# Patient Record
Sex: Female | Born: 1955 | Race: White | Hispanic: No | Marital: Married | Smoking: Never smoker
Health system: Southern US, Community
[De-identification: ages and names within clinical notes are randomized; demographics above are authoritative.]

## PROBLEM LIST (undated history)

## (undated) DIAGNOSIS — F99 Mental disorder, not otherwise specified: Secondary | ICD-10-CM

## (undated) DIAGNOSIS — Z794 Long term (current) use of insulin: Secondary | ICD-10-CM

## (undated) DIAGNOSIS — T4145XA Adverse effect of unspecified anesthetic, initial encounter: Secondary | ICD-10-CM

## (undated) DIAGNOSIS — T8859XA Other complications of anesthesia, initial encounter: Secondary | ICD-10-CM

## (undated) DIAGNOSIS — K449 Diaphragmatic hernia without obstruction or gangrene: Secondary | ICD-10-CM

## (undated) DIAGNOSIS — F32A Depression, unspecified: Secondary | ICD-10-CM

## (undated) DIAGNOSIS — Z87442 Personal history of urinary calculi: Secondary | ICD-10-CM

## (undated) DIAGNOSIS — F329 Major depressive disorder, single episode, unspecified: Secondary | ICD-10-CM

## (undated) DIAGNOSIS — E119 Type 2 diabetes mellitus without complications: Secondary | ICD-10-CM

## (undated) DIAGNOSIS — G4733 Obstructive sleep apnea (adult) (pediatric): Secondary | ICD-10-CM

## (undated) DIAGNOSIS — Z8719 Personal history of other diseases of the digestive system: Secondary | ICD-10-CM

## (undated) DIAGNOSIS — N201 Calculus of ureter: Secondary | ICD-10-CM

## (undated) DIAGNOSIS — E7800 Pure hypercholesterolemia, unspecified: Secondary | ICD-10-CM

## (undated) DIAGNOSIS — K219 Gastro-esophageal reflux disease without esophagitis: Secondary | ICD-10-CM

## (undated) DIAGNOSIS — I10 Essential (primary) hypertension: Secondary | ICD-10-CM

## (undated) DIAGNOSIS — N289 Disorder of kidney and ureter, unspecified: Secondary | ICD-10-CM

## (undated) DIAGNOSIS — Z973 Presence of spectacles and contact lenses: Secondary | ICD-10-CM

## (undated) DIAGNOSIS — F419 Anxiety disorder, unspecified: Secondary | ICD-10-CM

## (undated) HISTORY — PX: CARPAL TUNNEL RELEASE: SHX101

## (undated) HISTORY — PX: TUBAL LIGATION: SHX77

## (undated) HISTORY — PX: COLONOSCOPY: SHX174

## (undated) HISTORY — PX: TONSILLECTOMY: SUR1361

---

## 1982-05-19 HISTORY — PX: DILATION AND CURETTAGE OF UTERUS: SHX78

## 1998-11-13 ENCOUNTER — Other Ambulatory Visit: Admission: RE | Admit: 1998-11-13 | Discharge: 1998-11-13 | Payer: Self-pay | Admitting: Obstetrics & Gynecology

## 1999-05-20 HISTORY — PX: CARPAL TUNNEL RELEASE: SHX101

## 1999-09-03 ENCOUNTER — Ambulatory Visit (HOSPITAL_BASED_OUTPATIENT_CLINIC_OR_DEPARTMENT_OTHER): Admission: RE | Admit: 1999-09-03 | Discharge: 1999-09-03 | Payer: Self-pay | Admitting: Orthopedic Surgery

## 1999-11-25 ENCOUNTER — Other Ambulatory Visit: Admission: RE | Admit: 1999-11-25 | Discharge: 1999-11-25 | Payer: Self-pay | Admitting: Obstetrics & Gynecology

## 2000-12-07 ENCOUNTER — Other Ambulatory Visit: Admission: RE | Admit: 2000-12-07 | Discharge: 2000-12-07 | Payer: Self-pay | Admitting: Obstetrics & Gynecology

## 2001-05-19 DIAGNOSIS — E119 Type 2 diabetes mellitus without complications: Secondary | ICD-10-CM

## 2001-05-19 HISTORY — DX: Type 2 diabetes mellitus without complications: E11.9

## 2002-01-21 ENCOUNTER — Other Ambulatory Visit: Admission: RE | Admit: 2002-01-21 | Discharge: 2002-01-21 | Payer: Self-pay | Admitting: Obstetrics & Gynecology

## 2003-04-18 ENCOUNTER — Encounter: Payer: Self-pay | Admitting: Cardiovascular Disease

## 2003-12-15 ENCOUNTER — Other Ambulatory Visit: Admission: RE | Admit: 2003-12-15 | Discharge: 2003-12-15 | Payer: Self-pay | Admitting: Obstetrics & Gynecology

## 2010-04-03 ENCOUNTER — Ambulatory Visit (HOSPITAL_COMMUNITY): Admission: RE | Admit: 2010-04-03 | Discharge: 2010-04-03 | Payer: Self-pay | Admitting: Internal Medicine

## 2010-05-19 HISTORY — PX: SHOULDER ARTHROSCOPY W/ ROTATOR CUFF REPAIR: SHX2400

## 2010-10-02 ENCOUNTER — Ambulatory Visit (HOSPITAL_BASED_OUTPATIENT_CLINIC_OR_DEPARTMENT_OTHER)
Admission: RE | Admit: 2010-10-02 | Discharge: 2010-10-02 | Disposition: A | Discharge status: Home / Self Care | Payer: Medicare Other | Source: Ambulatory Visit | Attending: Orthopedic Surgery | Admitting: Orthopedic Surgery

## 2010-10-02 DIAGNOSIS — S43429A Sprain of unspecified rotator cuff capsule, initial encounter: Secondary | ICD-10-CM | POA: Insufficient documentation

## 2010-10-02 DIAGNOSIS — M7500 Adhesive capsulitis of unspecified shoulder: Secondary | ICD-10-CM | POA: Insufficient documentation

## 2010-10-02 DIAGNOSIS — Z794 Long term (current) use of insulin: Secondary | ICD-10-CM | POA: Insufficient documentation

## 2010-10-02 DIAGNOSIS — M25819 Other specified joint disorders, unspecified shoulder: Secondary | ICD-10-CM | POA: Insufficient documentation

## 2010-10-02 DIAGNOSIS — I10 Essential (primary) hypertension: Secondary | ICD-10-CM | POA: Insufficient documentation

## 2010-10-02 DIAGNOSIS — X58XXXA Exposure to other specified factors, initial encounter: Secondary | ICD-10-CM | POA: Insufficient documentation

## 2010-10-02 DIAGNOSIS — E119 Type 2 diabetes mellitus without complications: Secondary | ICD-10-CM | POA: Insufficient documentation

## 2010-10-02 DIAGNOSIS — Z79899 Other long term (current) drug therapy: Secondary | ICD-10-CM | POA: Insufficient documentation

## 2010-10-03 LAB — GLUCOSE, CAPILLARY: Glucose-Capillary: 147 mg/dL — ABNORMAL HIGH (ref 70–99)

## 2010-10-03 LAB — POCT I-STAT 4, (NA,K, GLUC, HGB,HCT): Hemoglobin: 14.3 g/dL (ref 12.0–15.0)

## 2010-10-04 NOTE — Op Note (Signed)
[place]  Patient:    Mindy Richard, MONCEAUX                       MRN: [id number] Proc. Date: 09/03/99 Attending:  Katy Fitch. Naaman Plummer., M.D. CC:         Katy Fitch. Sypher, Montez Hageman., M.D. (2)                           Operative Report  PREOPERATIVE DIAGNOSIS: 1. Right carpal tunnel syndrome. 2. Stenosing tenosynovitis right long finger at A-1 pulley.  POSTOPERATIVE DIAGNOSIS: 1. Right carpal tunnel syndrome. 2. Stenosing tenosynovitis right long finger at A-1 pulley.  OPERATION PERFORMED: 1. Right carpal tunnel release. 2. Release of right long finger A-1 pulley.  Note that these were performed through separate incisions.  OPERATING SURGEON:  Josephine Igo, M.D.  ANESTHESIA:  General by mask.  ANESTHESIOLOGIST:  Dr. Krista Blue.  INDICATIONS:  The patient is a 55 year old woman with type 2 diabetes.  She is managed on Glucophage.  She has a history of entrapment neuropathy symptoms and triggering of her right long finger, both unresponsive to nonoperative measures including activity modification, anti-inflammatory medication and rest.  Due to failure of nonoperative measures, the patient is brought to the operating room at this time for release of her right transverse carpal ligament and release of the A-1 pulley to the right long finger.  DESCRIPTION OF PROCEDURE:  Deniyah Dillavou was brought to the operating room and placed in supine position on the operating table.  Following induction of general anesthesia, the right arm was prepped with Betadine soap and solution and sterilely draped.  Due to a mild poison ivy rash near the elbow flexion crease, Op-Site was placed over this region to protect the lesions and to prevent exposure to the Betadine.  After sterile draping, the arm was exanguinated with an Esmarch bandage.  An arterial tourniquet was inflated to 220 mmHg.  The procedure commenced with a short transverse incision just distal to the  distal palmar crease overlying the palpable thickened A-1 pulley of the long finger.  The subcutaneous tissues were carefully divided taking care to identify the flexor sheath.  The A-1 pulley was isolated and split with scalpel and scissors.  No A-0 pulley was identified.  The tendons were noted to be otherwise normal.  This relieved the clinical triggering.  This wound was repaired with intradermal 3-0 Prolene suture.  Attention was then directed to the proximal palm where a short transverse incision was fashioned in the line of the ring finger.  Subcutaneous tissues were carefully divided revealing the palmar fascia.  This was split longitudinally to reveal the common sensory branches of the median nerve. These were followed back to the transverse carpal ligament which was carefully isolated from the median nerve.  The ligament was released on its ulnar border extending into the distal forearm.  This widely opened the carpal canal.  No masses or other predicaments were noted.  Bleeding points along the margin of the released ligament were electrocauterized with bipolar current followed by repair of the skin with intradermal 3-0 Prolene suture.  A compressive dressing was applied with a volar plaster splint maintaining the wrist in 5 degrees dorsiflexion. DD:  09/03/99 TD:  09/03/99 Job: [id number]

## 2010-10-08 NOTE — Op Note (Signed)
Mindy Richard, Mindy Richard                 ACCOUNT NO.:  [id number]  MEDICAL RECORD NO.:  [id number]           PATIENT TYPE:  LOCATION:                                FACILITY:  [facility]  PHYSICIAN:  Marlowe Kays, M.D.  DATE OF BIRTH:  January 29, 1956  DATE OF PROCEDURE:  10/02/2010 DATE OF DISCHARGE:                              OPERATIVE REPORT   PREOPERATIVE DIAGNOSES: 1. Adhesive capsulitis. 2. Partial rotator cuff tear, felt secondary to impingement of left     shoulder.  POSTOPERATIVE DIAGNOSES: 1. Adhesive capsulitis. 2. Partial rotator cuff tear, felt secondary to impingement of left     shoulder.  OPERATION: 1. Closed manipulation of left shoulder. 2. Arthroscopic subacromial decompression, left shoulder.  SURGEON:  Marlowe Kays, MD  ASSISTANT:  Druscilla Brownie. Idolina Primer, PA-C  ANESTHESIA:  General.  PATHOLOGY AND JUSTIFICATION FOR PROCEDURE:  She had a painful shoulder with some loss of motion.  She is a diabetic female.  MRI demonstrated findings compatible with adhesive capsulitis, but also interstitial tearing of the rotator cuff.  She had an impingement type bone structure.  Consequently, she is here for the above-mentioned surgery.  PROCEDURE IN DETAIL:  Time-out performed.  While in the supine position, I performed a closed manipulation of her left shoulder with essentially close to normal motion in all planes except for extension where there was a palpable and audible lysis of adhesions, but I was able to gently bring her up to 180 degrees of extension.  We then brought her into the beach-chair position and prepped the left shoulder girdle with a DuraPrep and draped in sterile field.  Lamina in the shoulder joint was marked out and posterior portals, lateral portal, and the subacromial space were all infiltrated with 0.5% Marcaine with adrenaline.  Through posterior soft spot portal, I atraumatically entered the glenohumeral joint.  Biceps tendon and rotator cuff  looked normal as did the labrum. There was some reactive type tissue adjacent to the labrum, which I felt to possibly have been due to the manipulation.  There was no active bleeding, however.  Then, we redirected the scope in the subacromial space and through a lateral portal, introduced a 4.2 shaver.  She had good bit of bursitis present and once I cleared this with a 4.2 shaver, prominent subacromial spurring was noted.  There was almost no room between the rotator cuff and the subacromial spur.  I next introduced the 90-degree ArthroCare vaporizer removing soft tissue from the underlying surface of the anterior acromion and followed this with a 4- mm oval bur, burring down the underneath surface of the acromion and went back-and-forth between the bur and the vaporizer until we had all soft tissue removed and impingement, corrected documented with pictures. She had looked some bursal surface irritation of the rotator cuff, but on inspection, there was no tear.  At the conclusion, there was no obvious bleeding.  All fluid possible was removed.  I re-injected subacromial space and the 2 portals with the Marcaine with adrenaline.  We then closed the 2 portals with 4-0 nylon.  Betadine, Adaptic, dry sterile dressing. Shoulder immobilizer  applied.  She tolerated the procedure well and was taken to recovery room in satisfactory condition with no known complications.          ______________________________ Marlowe Kays, M.D.     JA/MEDQ  D:  10/02/2010  T:  10/03/2010  Job:  [id number]  Electronically Signed by Marlowe Kays M.D. on 10/08/2010 12:36:57 PM

## 2010-10-22 ENCOUNTER — Ambulatory Visit: Payer: Medicare Other | Attending: Orthopedic Surgery | Admitting: Physical Therapy

## 2010-10-22 DIAGNOSIS — M25519 Pain in unspecified shoulder: Secondary | ICD-10-CM | POA: Insufficient documentation

## 2010-10-22 DIAGNOSIS — IMO0001 Reserved for inherently not codable concepts without codable children: Secondary | ICD-10-CM | POA: Insufficient documentation

## 2010-10-22 DIAGNOSIS — M25619 Stiffness of unspecified shoulder, not elsewhere classified: Secondary | ICD-10-CM | POA: Insufficient documentation

## 2010-10-28 ENCOUNTER — Ambulatory Visit: Payer: Medicare Other

## 2010-10-30 ENCOUNTER — Ambulatory Visit: Payer: Medicare Other

## 2010-11-05 ENCOUNTER — Ambulatory Visit: Payer: Medicare Other | Admitting: Physical Therapy

## 2010-11-07 ENCOUNTER — Ambulatory Visit: Payer: Medicare Other | Admitting: Physical Therapy

## 2010-11-11 ENCOUNTER — Ambulatory Visit: Payer: Medicare Other

## 2010-11-13 ENCOUNTER — Ambulatory Visit: Payer: Medicare Other

## 2010-11-21 ENCOUNTER — Ambulatory Visit: Payer: Medicare Other | Attending: Orthopedic Surgery | Admitting: Physical Therapy

## 2010-11-21 DIAGNOSIS — M25619 Stiffness of unspecified shoulder, not elsewhere classified: Secondary | ICD-10-CM | POA: Insufficient documentation

## 2010-11-21 DIAGNOSIS — IMO0001 Reserved for inherently not codable concepts without codable children: Secondary | ICD-10-CM | POA: Insufficient documentation

## 2010-11-21 DIAGNOSIS — M25519 Pain in unspecified shoulder: Secondary | ICD-10-CM | POA: Insufficient documentation

## 2010-11-25 ENCOUNTER — Ambulatory Visit: Payer: Medicare Other

## 2010-11-28 ENCOUNTER — Ambulatory Visit: Payer: Medicare Other | Admitting: Physical Therapy

## 2010-12-02 ENCOUNTER — Ambulatory Visit: Payer: Medicare Other | Admitting: Physical Therapy

## 2010-12-04 ENCOUNTER — Ambulatory Visit: Payer: Medicare Other | Admitting: Physical Therapy

## 2010-12-10 ENCOUNTER — Ambulatory Visit: Payer: Medicare Other | Admitting: Physical Therapy

## 2010-12-12 ENCOUNTER — Ambulatory Visit: Payer: Medicare Other | Admitting: Physical Therapy

## 2010-12-17 ENCOUNTER — Ambulatory Visit: Payer: Medicare Other | Admitting: Physical Therapy

## 2010-12-19 ENCOUNTER — Ambulatory Visit: Payer: Medicare Other | Attending: Orthopedic Surgery | Admitting: Physical Therapy

## 2010-12-19 DIAGNOSIS — M25619 Stiffness of unspecified shoulder, not elsewhere classified: Secondary | ICD-10-CM | POA: Insufficient documentation

## 2010-12-19 DIAGNOSIS — M25519 Pain in unspecified shoulder: Secondary | ICD-10-CM | POA: Insufficient documentation

## 2010-12-19 DIAGNOSIS — IMO0001 Reserved for inherently not codable concepts without codable children: Secondary | ICD-10-CM | POA: Insufficient documentation

## 2010-12-24 ENCOUNTER — Ambulatory Visit: Payer: Medicare Other | Admitting: Physical Therapy

## 2010-12-26 ENCOUNTER — Ambulatory Visit: Payer: Medicare Other | Admitting: Physical Therapy

## 2011-05-09 ENCOUNTER — Emergency Department (INDEPENDENT_AMBULATORY_CARE_PROVIDER_SITE_OTHER): Payer: Medicare Other

## 2011-05-09 ENCOUNTER — Inpatient Hospital Stay (HOSPITAL_BASED_OUTPATIENT_CLINIC_OR_DEPARTMENT_OTHER)
Admission: EM | Admit: 2011-05-09 | Discharge: 2011-05-10 | DRG: 392 | Disposition: A | Discharge status: Home / Self Care | Payer: Medicare Other | Attending: Internal Medicine | Admitting: Internal Medicine

## 2011-05-09 ENCOUNTER — Other Ambulatory Visit: Payer: Self-pay

## 2011-05-09 ENCOUNTER — Encounter: Payer: Self-pay | Admitting: *Deleted

## 2011-05-09 DIAGNOSIS — I10 Essential (primary) hypertension: Secondary | ICD-10-CM | POA: Diagnosis present

## 2011-05-09 DIAGNOSIS — F329 Major depressive disorder, single episode, unspecified: Secondary | ICD-10-CM | POA: Diagnosis present

## 2011-05-09 DIAGNOSIS — K2100 Gastro-esophageal reflux disease with esophagitis, without bleeding: Secondary | ICD-10-CM | POA: Diagnosis present

## 2011-05-09 DIAGNOSIS — I200 Unstable angina: Secondary | ICD-10-CM

## 2011-05-09 DIAGNOSIS — F325 Major depressive disorder, single episode, in full remission: Secondary | ICD-10-CM | POA: Diagnosis not present

## 2011-05-09 DIAGNOSIS — E785 Hyperlipidemia, unspecified: Secondary | ICD-10-CM | POA: Diagnosis present

## 2011-05-09 DIAGNOSIS — E119 Type 2 diabetes mellitus without complications: Secondary | ICD-10-CM | POA: Diagnosis present

## 2011-05-09 DIAGNOSIS — R079 Chest pain, unspecified: Secondary | ICD-10-CM

## 2011-05-09 DIAGNOSIS — K219 Gastro-esophageal reflux disease without esophagitis: Principal | ICD-10-CM | POA: Diagnosis present

## 2011-05-09 DIAGNOSIS — F3289 Other specified depressive episodes: Secondary | ICD-10-CM | POA: Diagnosis present

## 2011-05-09 HISTORY — DX: Anxiety disorder, unspecified: F41.9

## 2011-05-09 HISTORY — DX: Major depressive disorder, single episode, unspecified: F32.9

## 2011-05-09 HISTORY — DX: Gastro-esophageal reflux disease without esophagitis: K21.9

## 2011-05-09 HISTORY — DX: Mental disorder, not otherwise specified: F99

## 2011-05-09 HISTORY — DX: Pure hypercholesterolemia, unspecified: E78.00

## 2011-05-09 HISTORY — DX: Depression, unspecified: F32.A

## 2011-05-09 HISTORY — DX: Essential (primary) hypertension: I10

## 2011-05-09 LAB — PROTIME-INR
INR: 0.91 (ref 0.00–1.49)
Prothrombin Time: 12.4 seconds (ref 11.6–15.2)

## 2011-05-09 LAB — LIPASE, BLOOD: Lipase: 41 U/L (ref 11–59)

## 2011-05-09 LAB — CARDIAC PANEL(CRET KIN+CKTOT+MB+TROPI)
Relative Index: INVALID (ref 0.0–2.5)
Total CK: 59 U/L (ref 7–177)
Troponin I: <0.3 ng/mL (ref <0.30)

## 2011-05-09 LAB — DIFFERENTIAL
Basophils Absolute: 0.1 10*3/uL (ref 0.0–0.1)
Eosinophils Relative: 3 % (ref 0–5)
Lymphocytes Relative: 26 % (ref 12–46)
Lymphs Abs: 3.6 10*3/uL (ref 0.7–4.0)
Neutro Abs: 8.6 10*3/uL — ABNORMAL HIGH (ref 1.7–7.7)

## 2011-05-09 LAB — BASIC METABOLIC PANEL
CO2: 25 mEq/L (ref 19–32)
Calcium: 9.2 mg/dL (ref 8.4–10.5)
Chloride: 99 mEq/L (ref 96–112)
GFR calc Af Amer: >90 mL/min (ref >90)
Sodium: 137 mEq/L (ref 135–145)

## 2011-05-09 LAB — GLUCOSE, CAPILLARY
Glucose-Capillary: 146 mg/dL — ABNORMAL HIGH (ref 70–99)
Glucose-Capillary: 165 mg/dL — ABNORMAL HIGH (ref 70–99)
Glucose-Capillary: 139 mg/dL — ABNORMAL HIGH (ref 70–99)

## 2011-05-09 LAB — CBC
MCV: 83.1 fL (ref 78.0–100.0)
Platelets: 346 10*3/uL (ref 150–400)
RBC: 4.61 MIL/uL (ref 3.87–5.11)
WBC: 13.7 10*3/uL — ABNORMAL HIGH (ref 4.0–10.5)

## 2011-05-09 LAB — APTT: aPTT: 24 seconds (ref 24–37)

## 2011-05-09 LAB — TROPONIN I: Troponin I: <0.3 ng/mL (ref <0.30)

## 2011-05-09 MED ORDER — NITROGLYCERIN 0.4 MG SL SUBL: 0.4000 mg | SUBLINGUAL_TABLET | SUBLINGUAL | Status: DC | PRN

## 2011-05-09 MED ORDER — NITROGLYCERIN 2 % TD OINT
0.5000 [in_us] | TOPICAL_OINTMENT | Freq: Once | TRANSDERMAL | Status: AC
  Administered 2011-05-09: 0.5 [in_us] via TOPICAL
  Filled 2011-05-09: qty 30

## 2011-05-09 MED ORDER — INSULIN GLARGINE 100 UNIT/ML ~~LOC~~ SOLN
48.0000 [IU] | Freq: Every day | SUBCUTANEOUS | Status: DC
Start: 2011-05-09 — End: 2011-05-10
  Administered 2011-05-09: 48 [IU] via SUBCUTANEOUS
  Filled 2011-05-09: qty 3

## 2011-05-09 MED ORDER — HEPARIN SOD (PORCINE) IN D5W 100 UNIT/ML IV SOLN
INTRAVENOUS | Status: AC
  Filled 2011-05-09: qty 250

## 2011-05-09 MED ORDER — INSULIN ASPART 100 UNIT/ML ~~LOC~~ SOLN: 0.0000 [IU] | Freq: Every day | SUBCUTANEOUS | Status: DC

## 2011-05-09 MED ORDER — GI COCKTAIL ~~LOC~~
30.0000 mL | Freq: Once | ORAL | Status: AC
  Administered 2011-05-09: 30 mL via ORAL
  Filled 2011-05-09: qty 30

## 2011-05-09 MED ORDER — HEPARIN (PORCINE) IN NACL 100-0.45 UNIT/ML-% IJ SOLN: 12.0000 [IU]/kg/h | INTRAMUSCULAR | Status: DC

## 2011-05-09 MED ORDER — HEPARIN BOLUS VIA INFUSION
4000.0000 [IU] | Freq: Once | INTRAVENOUS | Status: DC
  Filled 2011-05-09: qty 4000

## 2011-05-09 MED ORDER — ACETAMINOPHEN 325 MG PO TABS
650.0000 mg | ORAL_TABLET | Freq: Four times a day (QID) | ORAL | Status: DC | PRN
  Administered 2011-05-09 – 2011-05-10 (×2): 650 mg via ORAL
  Filled 2011-05-09 (×2): qty 2

## 2011-05-09 MED ORDER — SIMVASTATIN 40 MG PO TABS
40.0000 mg | ORAL_TABLET | Freq: Every day | ORAL | Status: DC
  Administered 2011-05-09: 40 mg via ORAL
  Filled 2011-05-09 (×2): qty 1

## 2011-05-09 MED ORDER — INSULIN ASPART 100 UNIT/ML ~~LOC~~ SOLN
4.0000 [IU] | Freq: Three times a day (TID) | SUBCUTANEOUS | Status: DC
  Administered 2011-05-09 – 2011-05-10 (×3): 4 [IU] via SUBCUTANEOUS

## 2011-05-09 MED ORDER — SODIUM CHLORIDE 0.9 % IJ SOLN: 3.0000 mL | INTRAMUSCULAR | Status: DC | PRN

## 2011-05-09 MED ORDER — ASPIRIN EC 325 MG PO TBEC
325.0000 mg | DELAYED_RELEASE_TABLET | Freq: Every day | ORAL | Status: DC
  Administered 2011-05-10: 325 mg via ORAL
  Filled 2011-05-09: qty 1

## 2011-05-09 MED ORDER — SODIUM CHLORIDE 0.9 % IV SOLN: 250.0000 mL | INTRAVENOUS | Status: DC | PRN

## 2011-05-09 MED ORDER — ENOXAPARIN SODIUM 40 MG/0.4ML ~~LOC~~ SOLN
40.0000 mg | SUBCUTANEOUS | Status: DC
  Administered 2011-05-09 – 2011-05-10 (×2): 40 mg via SUBCUTANEOUS
  Filled 2011-05-09 (×2): qty 0.4

## 2011-05-09 MED ORDER — EZETIMIBE 10 MG PO TABS
10.0000 mg | ORAL_TABLET | Freq: Every day | ORAL | Status: DC
  Administered 2011-05-09: 10 mg via ORAL
  Filled 2011-05-09 (×2): qty 1

## 2011-05-09 MED ORDER — NITROGLYCERIN 0.4 MG SL SUBL
0.4000 mg | SUBLINGUAL_TABLET | SUBLINGUAL | Status: DC | PRN
  Administered 2011-05-09: 0.4 mg via SUBLINGUAL
  Filled 2011-05-09: qty 25

## 2011-05-09 MED ORDER — SODIUM CHLORIDE 0.9 % IJ SOLN
3.0000 mL | Freq: Two times a day (BID) | INTRAMUSCULAR | Status: DC
  Administered 2011-05-09 – 2011-05-10 (×3): 3 mL via INTRAVENOUS

## 2011-05-09 MED ORDER — VITAMIN D3 25 MCG (1000 UNIT) PO TABS
2000.0000 [IU] | ORAL_TABLET | Freq: Every day | ORAL | Status: DC
  Administered 2011-05-09 – 2011-05-10 (×2): 2000 [IU] via ORAL
  Filled 2011-05-09 (×2): qty 2

## 2011-05-09 MED ORDER — SODIUM CHLORIDE 0.9 % IV SOLN: Freq: Once | INTRAVENOUS | Status: DC

## 2011-05-09 MED ORDER — ZOLPIDEM TARTRATE 5 MG PO TABS
10.0000 mg | ORAL_TABLET | Freq: Every evening | ORAL | Status: DC | PRN
  Administered 2011-05-09: 10 mg via ORAL
  Filled 2011-05-09: qty 2

## 2011-05-09 MED ORDER — PANTOPRAZOLE SODIUM 40 MG PO TBEC
40.0000 mg | DELAYED_RELEASE_TABLET | Freq: Every day | ORAL | Status: DC
  Administered 2011-05-09 – 2011-05-10 (×2): 40 mg via ORAL
  Filled 2011-05-09 (×2): qty 1

## 2011-05-09 MED ORDER — INSULIN ASPART 100 UNIT/ML ~~LOC~~ SOLN
0.0000 [IU] | Freq: Three times a day (TID) | SUBCUTANEOUS | Status: DC
  Administered 2011-05-09: 2 [IU] via SUBCUTANEOUS
  Administered 2011-05-09: 3 [IU] via SUBCUTANEOUS
  Administered 2011-05-10: 2 [IU] via SUBCUTANEOUS
  Filled 2011-05-09: qty 3

## 2011-05-09 MED ORDER — SODIUM CHLORIDE 0.9 % IV SOLN: INTRAVENOUS | Status: DC

## 2011-05-09 MED ORDER — HEPARIN SODIUM (PORCINE) 5000 UNIT/ML IJ SOLN
INTRAMUSCULAR | Status: AC
  Administered 2011-05-09: 4000 [IU] via INTRAVENOUS
  Filled 2011-05-09: qty 1

## 2011-05-09 MED ORDER — BUPROPION HCL ER (XL) 300 MG PO TB24
300.0000 mg | ORAL_TABLET | Freq: Every day | ORAL | Status: DC
  Administered 2011-05-09 – 2011-05-10 (×2): 300 mg via ORAL
  Filled 2011-05-09 (×2): qty 1

## 2011-05-09 MED ORDER — ASPIRIN 81 MG PO CHEW
324.0000 mg | CHEWABLE_TABLET | Freq: Once | ORAL | Status: AC
  Administered 2011-05-09: 324 mg via ORAL
  Filled 2011-05-09: qty 1

## 2011-05-09 MED ORDER — BISOPROLOL-HYDROCHLOROTHIAZIDE 2.5-6.25 MG PO TABS
1.0000 | ORAL_TABLET | Freq: Every day | ORAL | Status: DC
  Administered 2011-05-10: 1 via ORAL
  Filled 2011-05-09 (×2): qty 1

## 2011-05-09 MED ORDER — VENLAFAXINE HCL 37.5 MG PO TABS
37.5000 mg | ORAL_TABLET | Freq: Two times a day (BID) | ORAL | Status: DC
  Administered 2011-05-09 – 2011-05-10 (×3): 37.5 mg via ORAL
  Filled 2011-05-09 (×4): qty 1

## 2011-05-09 NOTE — ED Notes (Signed)
Patient began having chest pain appx 1.5 hrs ago. She recently started a new medication (dexilant) and believes she is reacting to it. She is having mid-chest pain radiating to her back, also SOB

## 2011-05-09 NOTE — ED Provider Notes (Addendum)
History     CSN: [id number]  Arrival date & time 05/09/11  0032   First MD Initiated Contact with Patient 05/09/11 0050      Chief Complaint  Patient presents with  . Chest Pain    (Consider location/radiation/quality/duration/timing/severity/associated sxs/prior treatment) HPI Comments: Patient is a 55 year old female with a history of hypertension, diabetes, high cholesterol who does not smoke cigarettes and his father died at the age of 29 of a heart attack. He presents with intermittent chest pain today. This first started around noon, went away after approximately one hour and then came back about 10:30 this evening. The pain is described as a squeezing, mid chest, radiation to the back and under the right shoulder blade. She states that it is worse with taking a deep breath, associated with dizziness, increased burping but no nausea vomiting shortness of breath cough or fevers. She does admit to having a stress test 8-10 years ago which she states was a chemical stress test and was normal. She also notes that today she started a new medication for acid reflux in place of her old proton pump inhibitor  Dexilant - new med  PMD:  Dr. Lucky Cowboy  Patient is a 55 y.o. female presenting with chest pain. The history is provided by the patient and the spouse.  Chest Pain     Past Medical History  Diagnosis Date  . Diabetes mellitus   . Hypertension   . Hypercholesteremia   . GERD (gastroesophageal reflux disease)     History reviewed. No pertinent past surgical history.  No family history on file.  History  Substance Use Topics  . Smoking status: Never Smoker   . Smokeless tobacco: Not on file  . Alcohol Use: No    OB History    Grav Para Term Preterm Abortions TAB SAB Ect Mult Living                  Review of Systems  Cardiovascular: Positive for chest pain.  All other systems reviewed and are negative.    Allergies  Review of patient's allergies  indicates not on file.  Home Medications  No current outpatient prescriptions on file.  BP 98/55  Pulse 79  Resp 18  SpO2 98%  Physical Exam  Nursing note and vitals reviewed. Constitutional: She appears well-developed and well-nourished. No distress.  HENT:  Head: Normocephalic and atraumatic.  Mouth/Throat: Oropharynx is clear and moist. No oropharyngeal exudate.  Eyes: Conjunctivae and EOM are normal. Pupils are equal, round, and reactive to light. Right eye exhibits no discharge. Left eye exhibits no discharge. No scleral icterus.  Neck: Normal range of motion. Neck supple. No JVD present. No thyromegaly present.  Cardiovascular: Normal rate, regular rhythm, normal heart sounds and intact distal pulses.  Exam reveals no gallop and no friction rub.   No murmur heard. Pulmonary/Chest: Effort normal and breath sounds normal. No respiratory distress. She has no wheezes. She has no rales. She exhibits no tenderness.  Abdominal: Soft. Bowel sounds are normal. She exhibits no distension and no mass. There is tenderness ( Tender to palpation in the epigastrium, patient states is reproducible of her pain).  Musculoskeletal: Normal range of motion. She exhibits no edema and no tenderness.  Lymphadenopathy:    She has no cervical adenopathy.  Neurological: She is alert. Coordination normal.  Skin: Skin is warm and dry. No rash noted. No erythema.  Psychiatric: She has a normal mood and affect. Her behavior is normal.  ED Course  Procedures (including critical care time)  Labs Reviewed  BASIC METABOLIC PANEL - Abnormal; Notable for the following:    Glucose, Bld 204 (*)    GFR calc non Af Amer 81 (*)    All other components within normal limits  CBC - Abnormal; Notable for the following:    WBC 13.7 (*)    All other components within normal limits  DIFFERENTIAL - Abnormal; Notable for the following:    Neutro Abs 8.6 (*)    All other components within normal limits  APTT    PROTIME-INR  TROPONIN I  LIPASE, BLOOD   Dg Chest Port 1 View  05/09/2011  *RADIOLOGY REPORT*  Clinical Data: Chest pain  PORTABLE CHEST - 1 VIEW  Comparison: 04/03/2010  Findings: Lungs are essentially clear. No pleural effusion or pneumothorax.  The heart is top normal in size.  Degenerative changes of the visualized thoracolumbar spine.  IMPRESSION: No evidence of acute cardiopulmonary disease.  Original Report Authenticated By: Charline Bills, M.D.     1. Unstable angina       MDM  Benign abdomen but has epigastric tenderness consistent with a stomach ulcer. EKG while having chest pain for 2 straight hours shows no signs of ischemia, normal ST segments and normal sinus rhythm. This is unchanged from a prior EKG from May of this year. We'll get cardiac enzymes x2 sets, GI cocktail, laboratory workup including lipase and liver function.  ED ECG REPORT   Date: 05/09/2011   Rate: 61  Rhythm: normal sinus rhythm  QRS Axis: normal  Intervals: normal  ST/T Wave abnormalities: normal  Conduction Disutrbances:none  Narrative Interpretation:   Old EKG Reviewed: unchanged from 10/02/2010  Patient reevaluated several times and found to have persistent pain despite the GI cocktail. A sublingual nitroglycerin pill and her pain completely resolved. After 30 minutes the pain has resumed. Vital signs remain normal with pulse of 70, blood pressure of 130 and oxygen saturations of 100%.  Chest x-ray negative, troponin normal, lipase normal, EKG nonischemic. With pain responsive to nitroglycerin and with all of her risk factors this is suggestive of unstable angina. Nitroglycerin paste applied, heparin bolus given, aspirin given in the ER.  I have discussed her care with Dr. Toniann Fail of the hospitalist service who will admit the patient and accepts the patient in transfer.  CRITICAL CARE Performed by: Vida Roller   Total critical care time: 30  Critical care time was exclusive of  separately billable procedures and treating other patients.  Critical care was necessary to treat or prevent imminent or life-threatening deterioration.  Critical care was time spent personally by me on the following activities: development of treatment plan with patient and/or surrogate as well as nursing, discussions with consultants, evaluation of patient's response to treatment, examination of patient, obtaining history from patient or surrogate, ordering and performing treatments and interventions, ordering and review of laboratory studies, ordering and review of radiographic studies, pulse oximetry and re-evaluation of patient's condition.        Vida Roller, MD 05/09/11 1610  Vida Roller, MD 05/09/11 [id number]

## 2011-05-09 NOTE — H&P (Signed)
PCP:  Nadean Corwin, MD, MD   DOA:  05/09/2011 12:37 AM  Chief Complaint:  Chest pain x 1 day  HPI: [name] is a 55 year old Caucasian female history of hypertension, diabetes mellitus on insulin, hyperlipidemia, and GERD and depression with family history of MI presented to the ED with substernal chest pain which started at 10:30 PM. Last night. Patient was in her usual state of health and sitting home when she started having squeezing substernal chest pain that radiated course the upper back and  the right shoulder blade and lasted for almost 2 hours until she came to the ED. Pain was 10 out of 10 in severity without any aggravating or relieving factors. Patient denies any shortness of breath, nausea, vomiting, or palpitations. She initially thought these symptoms were similar to her GERD but had to come to the ED as they were very severe and persistent. She denies having similar pain symptoms in the past. In the ED she was given  GI cocktail which did not relieve the symptoms  and then given 325 mg of aspirin, sublingual nitrate after which the pain was down to 2 . She informs having a stress test done about 8 years for chest pain symptoms and was negative. She denies any recent travel, denies any bowel or urinary symptoms. She informs having her omeprazole being changed to new PPI yesterday. At baseline she does not exercise regularly but is actively involved in her household work.  Allergies: Allergies  Allergen Reactions  . Nsaids Nausea Only    Prior to Admission medications   Medication Sig Start Date End Date Taking? Authorizing Provider  bisoprolol-hydrochlorothiazide (ZIAC) 2.5-6.25 MG per tablet Take 1 tablet by mouth daily.    Yes Historical Provider, MD  buPROPion (WELLBUTRIN XL) 300 MG 24 hr tablet Take 300 mg by mouth daily.    Yes Historical Provider, MD  cholecalciferol (VITAMIN D) 1000 UNITS tablet Take 2,000 Units by mouth daily.    Yes Historical Provider, MD    ezetimibe (ZETIA) 10 MG tablet Take 10 mg by mouth at bedtime.    Yes Historical Provider, MD  insulin aspart (NOVOLOG) 100 UNIT/ML injection Inject 48 Units into the skin 3 (three) times daily before meals.    Yes Historical Provider, MD  insulin glargine (LANTUS) 100 UNIT/ML injection Inject 48 Units into the skin at bedtime.     Yes Historical Provider, MD  pravastatin (PRAVACHOL) 40 MG tablet Take 40 mg by mouth at bedtime.    Yes Historical Provider, MD  saxagliptin HCl (ONGLYZA) 2.5 MG TABS tablet Take 5 mg by mouth daily.     Yes Historical Provider, MD  venlafaxine (EFFEXOR) 37.5 MG tablet Take 37.5 mg by mouth 2 (two) times daily.     Yes Historical Provider, MD    Past Medical History  Diagnosis Date  . Diabetes mellitus   . Hypertension   . Hypercholesteremia   . GERD (gastroesophageal reflux disease)   . Mental disorder   . Anxiety   . Depression     Past Surgical History  Procedure Date  . Tonsillectomy   . Shoulder arthroscopy w/ rotator cuff repair   . Cesarean section   . Hand surgery   . Tubal ligation   . Appendectomy   . Dilation and curettage of uterus     Social History: Never smoked, denies alcohol or illicit drug use.  Family history Father died at 100 with MI. Mother has diabetes  Review of Systems:  Constitutional:  Denies fever, chills, diaphoresis, appetite change and fatigue.  HEENT: Denies photophobia, eye pain, redness, hearing loss, ear pain, congestion, sore throat, rhinorrhea, sneezing, mouth sores, trouble swallowing, neck pain, neck stiffness and tinnitus.   Respiratory: Denies SOB, DOE, cough, chest tightness,  and wheezing.   Cardiovascular: chest pain, palpitations and leg swelling.  Gastrointestinal: Denies nausea, vomiting, abdominal pain, diarrhea, constipation, blood in stool and abdominal distention.  Genitourinary: Denies dysuria, urgency, frequency, hematuria, flank pain and difficulty urinating.  Musculoskeletal: Denies  myalgias, back pain, joint swelling, arthralgias and gait problem.  Skin: Denies pallor, rash and wound.  Neurological: Denies dizziness, seizures, syncope, weakness, light-headedness, numbness and headaches.  Hematological: Denies adenopathy. Easy bruising, personal or family bleeding history  Psychiatric/Behavioral: Denies suicidal ideation, mood changes, confusion, nervousness, sleep disturbance and agitation   Physical Exam:  Filed Vitals:   05/09/11 0252 05/09/11 0326 05/09/11 0411 05/09/11 0500  BP: 98/55  106/53 105/63  Pulse: 79  67 68  Temp:    99.1 F (37.3 C)  TempSrc:    Oral  Resp: 18  21 20   Height:  5\' 3"  (1.6 m)  5\' 3"  (1.6 m)  Weight:  81.647 kg (180 lb)  83.008 kg (183 lb)  SpO2: 98%  99% 100%    Constitutional: Vital signs reviewed.  Patient is a well-developed and well-nourished in no acute distress and cooperative with exam. Alert and oriented x3.  HEENT: No pallor, no icterus moist oral mucosa , no JVD  Cardiovascular: RRR, S1 normal, S2 normal, no MRG, pulses symmetric and intact bilaterally Pulmonary/Chest: CTAB, no wheezes, rales, or rhonchi Abdominal: Soft. Non-tender, non-distended, bowel sounds are normal, no masses, organomegaly, or guarding present.  GU: no CVA tenderness Musculoskeletal: No joint deformities, erythema, or stiffness, ROM full and no nontender Ext: no edema and no cyanosis, pulses palpable bilaterally (DP and PT) Hematology: no cervical, inginal, or axillary adenopathy.  Neurological: A&O x3, Strenght is normal and symmetric bilaterally, cranial nerve II-XII are grossly intact, no focal motor deficit, sensory intact to light touch bilaterally.  Skin: Warm, dry and intact. No rash, cyanosis, or clubbing.  Psychiatric: Normal mood and affect. speech and behavior is normal. Judgment and thought content normal. Cognition and memory are normal.   Labs on Admission:  Results for orders placed during the hospital encounter of 05/09/11 (from  the past 48 hour(s))  BASIC METABOLIC PANEL     Status: Abnormal   Collection Time   05/09/11  1:08 AM      Component Value Range Comment   Sodium 137  135 - 145 (mEq/L)    Potassium 3.6  3.5 - 5.1 (mEq/L)    Chloride 99  96 - 112 (mEq/L)    CO2 25  19 - 32 (mEq/L)    Glucose, Bld 204 (*) 70 - 99 (mg/dL)    BUN 12  6 - 23 (mg/dL)    Creatinine, Ser 0.98  0.50 - 1.10 (mg/dL)    Calcium 9.2  8.4 - 10.5 (mg/dL)    GFR calc non Af Amer 81 (*) >90 (mL/min)    GFR calc Af Amer >90  >90 (mL/min)   CBC     Status: Abnormal   Collection Time   05/09/11  1:08 AM      Component Value Range Comment   WBC 13.7 (*) 4.0 - 10.5 (K/uL)    RBC 4.61  3.87 - 5.11 (MIL/uL)    Hemoglobin 13.1  12.0 - 15.0 (g/dL)    HCT  38.3  36.0 - 46.0 (%)    MCV 83.1  78.0 - 100.0 (fL)    MCH 28.4  26.0 - 34.0 (pg)    MCHC 34.2  30.0 - 36.0 (g/dL)    RDW 16.1  09.6 - 04.5 (%)    Platelets 346  150 - 400 (K/uL)   DIFFERENTIAL     Status: Abnormal   Collection Time   05/09/11  1:08 AM      Component Value Range Comment   Neutrophils Relative 63  43 - 77 (%)    Neutro Abs 8.6 (*) 1.7 - 7.7 (K/uL)    Lymphocytes Relative 26  12 - 46 (%)    Lymphs Abs 3.6  0.7 - 4.0 (K/uL)    Monocytes Relative 7  3 - 12 (%)    Monocytes Absolute 1.0  0.1 - 1.0 (K/uL)    Eosinophils Relative 3  0 - 5 (%)    Eosinophils Absolute 0.4  0.0 - 0.7 (K/uL)    Basophils Relative 0  0 - 1 (%)    Basophils Absolute 0.1  0.0 - 0.1 (K/uL)   APTT     Status: Normal   Collection Time   05/09/11  1:08 AM      Component Value Range Comment   aPTT 24  24 - 37 (seconds)   PROTIME-INR     Status: Normal   Collection Time   05/09/11  1:08 AM      Component Value Range Comment   Prothrombin Time 12.4  11.6 - 15.2 (seconds)    INR 0.91  0.00 - 1.49    TROPONIN I     Status: Normal   Collection Time   05/09/11  1:08 AM      Component Value Range Comment   Troponin I <0.30  <0.30 (ng/mL)   LIPASE, BLOOD     Status: Normal   Collection Time    05/09/11  1:08 AM      Component Value Range Comment   Lipase 41  11 - 59 (U/L)   GLUCOSE, CAPILLARY     Status: Abnormal   Collection Time   05/09/11  8:09 AM      Component Value Range Comment   Glucose-Capillary 146 (*) 70 - 99 (mg/dL)     Radiological Exams on Admission: No results found.  Assessment 55 year old female with history of hypertension, diabetes mellitus, hyperlipidemia, GERD and family history of MI presented to the ED with acute onset substernal chest pain significantly relieved with sublingual nitrate. Should admitted to medical floor on telemetry for rule out ACS.   Plan  Chest pain Patient does have a history of ongoing GERD however her symptoms are quite concerning for her angina. Patient admitted to telemetry monitoring. Will rule out for ACS with serial cardiac enzymes and EKG. Her admission EKG is unremarkable and initial troponin is negative. We'll continue her on aspirin 325 mg daily.  continue sublingual nitrate when necessary She is on beta blocker at home which I will continue she is on pravastatin at home and will switch to simvastatin Given her significant risk factors she'll likely need inpatient stress test if she rules out for ACS. Have spoken to Dr. Myrtis Ser from [facility]  cardiologist and will evaluate the patient  Diabetes mellitus Continue home dose of Lantus and sliding scale insulin  Hypertension Continue home medications  Depression Continue  home meds   GERD  cont PPI Patient has several years history of GERD on PPI  and wound benefit from outpatient GI eval for EGD Diet cardiac and diabetic  DVT prophylaxis :(subcutaneous Lovenox  Full-code  Time Spent on Admission: 45 minutes  Mindy Richard 05/09/2011, 8:32 AM

## 2011-05-09 NOTE — Consult Note (Signed)
Physician Discharge Summary  Patient ID: Mindy Richard MRN: [id number] DOB/AGE: [birth year] 55 y.o. Admit date: 05/09/2011  Primary Care Physician:MCKEOWN,WILLIAM DAVID, MD, MD Primary Cardiologist New... Jerral Bonito Principal Problem:  *Chest pain at rest Active Problems:  Hypertension  Diabetes mellitus  Hyperlipidemia  Depression  GERD (gastroesophageal reflux disease)  HPI:  The patient is seen today for cardiac evaluation. She has risk factors for coronary disease including hypertension diabetes and hyperlipidemia. She has GERD. She's had some chest pain in the past. However last night she had persistent pain. She said that it was 10 out of 10 pain. This persisted. She came to the emergency room and a GI cocktail did not help. She received nitroglycerin that seemed to help some. She is now pain free. She relates that recently in her house she sprayed significantly for fleas in her home. She's not complaining of any pulmonary difficulties.  ROS:  Patient denies fever, chills, headache, sweats, rash, change in vision, change in hearing, cough, nausea vomiting, urinary symptoms. All other systems are reviewed and are negative.   Past Medical History  Diagnosis Date  . Diabetes mellitus   . Hypertension   . Hypercholesteremia   . GERD (gastroesophageal reflux disease)   . Mental disorder   . Anxiety   . Depression     History reviewed. No pertinent family history.  History   Social History  . Marital Status: Married    Spouse Name: N/A    Number of Children: N/A  . Years of Education: N/A   Occupational History  . Not on file.   Social History Main Topics  . Smoking status: Never Smoker   . Smokeless tobacco: Not on file  . Alcohol Use: No  . Drug Use: No  . Sexually Active: Yes   Other Topics Concern  . Not on file   Social History Narrative  . No narrative on file    Past Surgical History  Procedure Date  . Tonsillectomy   . Shoulder arthroscopy w/  rotator cuff repair   . Cesarean section   . Hand surgery   . Tubal ligation   . Appendectomy   . Dilation and curettage of uterus      Prescriptions prior to admission  Medication Sig Dispense Refill  . bisoprolol-hydrochlorothiazide (ZIAC) 2.5-6.25 MG per tablet Take 1 tablet by mouth daily.       Marland Kitchen buPROPion (WELLBUTRIN XL) 300 MG 24 hr tablet Take 300 mg by mouth daily.       . cholecalciferol (VITAMIN D) 1000 UNITS tablet Take 2,000 Units by mouth daily.       Marland Kitchen ezetimibe (ZETIA) 10 MG tablet Take 10 mg by mouth at bedtime.       . insulin aspart (NOVOLOG) 100 UNIT/ML injection Inject 48 Units into the skin 3 (three) times daily before meals.       . insulin glargine (LANTUS) 100 UNIT/ML injection Inject 48 Units into the skin at bedtime.        . pravastatin (PRAVACHOL) 40 MG tablet Take 40 mg by mouth at bedtime.       . saxagliptin HCl (ONGLYZA) 2.5 MG TABS tablet Take 5 mg by mouth daily.        Marland Kitchen venlafaxine (EFFEXOR) 37.5 MG tablet Take 37.5 mg by mouth 2 (two) times daily.          Physical Exam: Blood pressure 98/66, pulse 68, temperature 99.1 F (37.3 C), temperature source Oral, resp. rate 20, height  5\' 3"  (1.6 m), weight 183 lb (83.008 kg), SpO2 100.00%.  Patient is oriented to person time and place. Affect is normal. Head is atraumatic. There is no xanthelasma. There is no jugular venous distention. Lungs are clear. Respiratory effort is nonlabored. Cardiac exam reveals S1 and S2. There no clicks or significant murmurs. The abdomen is soft. The patient is overweight. There is no peripheral edema. There no musculoskeletal deformities. There are no skin rashes.   Labs:   Lab Results  Component Value Date   WBC 13.7* 05/09/2011   HGB 13.1 05/09/2011   HCT 38.3 05/09/2011   MCV 83.1 05/09/2011   PLT 346 05/09/2011    Lab 05/09/11 0108  NA 137  K 3.6  CL 99  CO2 25  BUN 12  CREATININE 0.80  CALCIUM 9.2  PROT --  BILITOT --  ALKPHOS --  ALT --  AST --    GLUCOSE 204*   Lab Results  Component Value Date   CKTOTAL 59 05/09/2011   CKMB 1.5 05/09/2011   TROPONINI <0.30 05/09/2011    No results found for this basename: CHOL   No results found for this basename: HDL   No results found for this basename: LDLCALC   No results found for this basename: TRIG   No results found for this basename: CHOLHDL   No results found for this basename: LDLDIRECT      Radiology: Dg Chest Port 1 View  05/09/2011  *RADIOLOGY REPORT*  Clinical Data: Chest pain  PORTABLE CHEST - 1 VIEW  Comparison: 04/03/2010  Findings: Lungs are essentially clear. No pleural effusion or pneumothorax.  The heart is top normal in size.  Degenerative changes of the visualized thoracolumbar spine.  IMPRESSION: No evidence of acute cardiopulmonary disease.  Original Report Authenticated By: Charline Bills, M.D.   EKG: The patient has had 2 EKGs since admission. The first EKG is completely normal. The second EKG has limb lead reversal but the QRS is also normal.  ASSESSMENT AND PLAN:  Principal Problem:   *Chest pain at rest    The etiology of this pain is not yet clear. The patient described very severe pain that persisted. Despite this her EKG is normal and her cardiac enzymes have been normal so far. On the other hand she says she began to get relief only when she received nitroglycerin. It is possible she could have coronary disease or possibly esophageal spasm. Because of her presentation I feel we should proceed with a stress test while she is in the hospital. I will order this.  Otherwise I would continue aggressive primary prevention of her hypertension diabetes and hyperlipidemia.  Active Problems:  Hypertension  Diabetes mellitus  Hyperlipidemia  Depression  GERD (gastroesophageal reflux disease)   Signed: Willa Rough, MD, [facility] 05/09/2011, 12:57 PM

## 2011-05-10 ENCOUNTER — Inpatient Hospital Stay (HOSPITAL_COMMUNITY): Payer: Medicare Other

## 2011-05-10 ENCOUNTER — Other Ambulatory Visit: Payer: Self-pay

## 2011-05-10 DIAGNOSIS — R079 Chest pain, unspecified: Secondary | ICD-10-CM

## 2011-05-10 LAB — CARDIAC PANEL(CRET KIN+CKTOT+MB+TROPI): Troponin I: <0.3 ng/mL (ref <0.30)

## 2011-05-10 MED ORDER — TECHNETIUM TC 99M TETROFOSMIN IV KIT
10.0000 | PACK | Freq: Once | INTRAVENOUS | Status: AC | PRN
  Administered 2011-05-10: 10 via INTRAVENOUS

## 2011-05-10 MED ORDER — REGADENOSON 0.4 MG/5ML IV SOLN
0.4000 mg | Freq: Once | INTRAVENOUS | Status: AC
  Administered 2011-05-10: 0.4 mg via INTRAVENOUS

## 2011-05-10 MED ORDER — TECHNETIUM TC 99M TETROFOSMIN IV KIT
30.0000 | PACK | Freq: Once | INTRAVENOUS | Status: AC | PRN
  Administered 2011-05-10: 30 via INTRAVENOUS

## 2011-05-10 NOTE — Progress Notes (Signed)
Pt discharged home w/ family.  Patient able to verbalize understanding of d/c instructions.  IV and tele d/c'd.  D/c assessment unchanged from previous assessment.

## 2011-05-10 NOTE — Discharge Summary (Addendum)
Patient ID: Mindy Richard MRN: [id number] DOB/AGE: 08-20-1955 55 y.o.  Admit date: 05/09/2011 Discharge date: 05/10/2011  Primary Care Physician:  Nadean Corwin, MD, MD  Discharge Diagnoses:    Present on Admission:  .Chest pain at rest .Hypertension .Diabetes mellitus .Hyperlipidemia .GERD (gastroesophageal reflux disease)    Current Discharge Medication List    CONTINUE these medications which have NOT CHANGED   Details  bisoprolol-hydrochlorothiazide (ZIAC) 2.5-6.25 MG per tablet Take 1 tablet by mouth daily.     buPROPion (WELLBUTRIN XL) 300 MG 24 hr tablet Take 300 mg by mouth daily.     cholecalciferol (VITAMIN D) 1000 UNITS tablet Take 2,000 Units by mouth daily.     ezetimibe (ZETIA) 10 MG tablet Take 10 mg by mouth at bedtime.     insulin glargine (LANTUS) 100 UNIT/ML injection Inject 48 Units into the skin at bedtime.      pravastatin (PRAVACHOL) 40 MG tablet Take 40 mg by mouth at bedtime.     saxagliptin HCl (ONGLYZA) 2.5 MG TABS tablet Take 5 mg by mouth daily.      venlafaxine (EFFEXOR) 37.5 MG tablet Take 37.5 mg by mouth 2 (two) times daily.        STOP taking these medications     insulin aspart (NOVOLOG) 100 UNIT/ML injection         Disposition and Follow-up:  Follow up with PCP in 1 week. Would recommend outpatient GI evaluation   Consults:   [facility] cardiology)  Significant Diagnostic Studies:  Dg Chest Port 1 View  05/09/2011  *RADIOLOGY REPORT*  Clinical Data: Chest pain  PORTABLE CHEST - 1 VIEW  Comparison: 04/03/2010  Findings: Lungs are essentially clear. No pleural effusion or pneumothorax.  The heart is top normal in size.  Degenerative changes of the visualized thoracolumbar spine.  IMPRESSION: No evidence of acute cardiopulmonary disease.  Original Report Authenticated By: Charline Bills, M.D.    Brief H and P: For complete details please refer to admission H and P, but in brief Mindy Richard is a 55 year old  Caucasian female history of hypertension, diabetes mellitus on insulin, hyperlipidemia, and GERD and depression with family history of MI presented to the ED with substernal chest pain which started at 10:30 PM. Last night. Patient was in her usual state of health and sitting home when she started having squeezing substernal chest pain that radiated course the upper back and the right shoulder blade and lasted for almost 2 hours until she came to the ED. Pain was 10 out of 10 in severity without any aggravating or relieving factors. Patient denies any shortness of breath, nausea, vomiting, or palpitations. She initially thought these symptoms were similar to her GERD but had to come to the ED as they were very severe and persistent. She denies having similar pain symptoms in the past. In the ED she was given GI cocktail which did not relieve the symptoms and then given 325 mg of aspirin, sublingual nitrate after which the pain was down to 2 .  She informs having a stress test done about 8 years for chest pain symptoms and was negative. She denies any recent travel, denies any bowel or urinary symptoms. She informs having her omeprazole being changed to new PPI yesterday. At baseline she does not exercise regularly but is actively involved in her household work.   Physical Exam on Discharge:  Filed Vitals:   05/10/11 0945 05/10/11 0947 05/10/11 0949 05/10/11 0951  BP: 131/58 135/66 129/66 126/62  Pulse: 106 95 90 88  Temp:      TempSrc:      Resp:      Height:      Weight:      SpO2:         Intake/Output Summary (Last 24 hours) at 05/10/11 1327 Last data filed at 05/10/11 0900  Gross per 24 hour  Intake    480 ml  Output      0 ml  Net    480 ml    General: Alert, awake, oriented x3, in no acute distress. HEENT: No bruits, no goiter. Heart: Regular rate and rhythm, without murmurs, rubs, gallops. Lungs: Clear to auscultation bilaterally. Abdomen: Soft, nontender, nondistended, positive  bowel sounds. Extremities: No clubbing cyanosis or edema with positive pedal pulses. Neuro: Grossly intact, nonfocal.  CBC:    Component Value Date/Time   WBC 13.7* 05/09/2011 0108   HGB 13.1 05/09/2011 0108   HCT 38.3 05/09/2011 0108   PLT 346 05/09/2011 0108   MCV 83.1 05/09/2011 0108   NEUTROABS 8.6* 05/09/2011 0108   LYMPHSABS 3.6 05/09/2011 0108   MONOABS 1.0 05/09/2011 0108   EOSABS 0.4 05/09/2011 0108   BASOSABS 0.1 05/09/2011 0108    Basic Metabolic Panel:    Component Value Date/Time   NA 137 05/09/2011 0108   K 3.6 05/09/2011 0108   CL 99 05/09/2011 0108   CO2 25 05/09/2011 0108   BUN 12 05/09/2011 0108   CREATININE 0.80 05/09/2011 0108   GLUCOSE 204* 05/09/2011 0108   CALCIUM 9.2 05/09/2011 0108    Hospital Course:  patient was admitted to medical floor on telemetry. She was given ASA and s/l nitrate. She is already on beta blocker and was continued. Although she has underlying chronic GERD her symptomswere different on presentation and she has high risk factors.. Admission EKG and serial cardiac enzymes were negative. She was stable on telemetry. Cardiology consult was called who recommend getting inpatient stress test. Nuclear stress test was done which showed an EF of 81% but negative for ischemia. Her chest pain symptoms are likely due to her GERD and she would benefit from an outpt GI  evaluation given chronic GERD symptoms not resolved on PPI.  Her other medical problems have been stable and she can be discharged home with outpt follow up.  Time spent on Discharge: 40 minutes  Signed: Eddie North 05/10/2011, 1:27 PM

## 2011-05-10 NOTE — Progress Notes (Signed)
@   Subjective:  Denies CP or dyspnea   Objective:  Filed Vitals:   05/09/11 1010 05/09/11 1300 05/09/11 2003 05/10/11 0548  BP: 98/66 107/65 110/71 104/61  Pulse:  65 73 81  Temp:  98.3 F (36.8 C) 98.9 F (37.2 C) 98.3 F (36.8 C)  TempSrc:   Oral Oral  Resp:  16 18 16   Height:      Weight:      SpO2:  96% 95% 96%    Intake/Output from previous day:  Intake/Output Summary (Last 24 hours) at 05/10/11 1610 Last data filed at 05/09/11 2327  Gross per 24 hour  Intake    963 ml  Output      0 ml  Net    963 ml    Physical Exam: Physical exam: Well-developed well-nourished in no acute distress.  Skin is warm and dry.  HEENT is normal.  Neck is supple. No thyromegaly.  Chest is clear to auscultation with normal expansion.  Cardiovascular exam is regular rate and rhythm.  Abdominal exam nontender or distended. No masses palpated. Extremities show no edema. neuro grossly intact    Lab Results: Basic Metabolic Panel:  [facility] 05/09/11 0108  NA 137  K 3.6  CL 99  CO2 25  GLUCOSE 204*  BUN 12  CREATININE 0.80  CALCIUM 9.2  MG --  PHOS --   CBC:  Basename 05/09/11 0108  WBC 13.7*  NEUTROABS 8.6*  HGB 13.1  HCT 38.3  MCV 83.1  PLT 346   Cardiac Enzymes:  Basename 05/10/11 0032 05/09/11 1600 05/09/11 0820  CKTOTAL 63 62 59  CKMB 1.3 1.4 1.5  CKMBINDEX -- -- --  TROPONINI <0.30 <0.30 <0.30     Assessment/Plan:  1) CP; enzymes neg; for functional study today. If neg, she can be dced from cardiac standpoint and fu with her primary care. 2) DM - per primary care 3) HTN-Continue present BP meds. 4) Hyperlipidemia - Continue preadmission meds.  Mindy Richard 05/10/2011, 7:07 AM

## 2011-05-15 NOTE — Progress Notes (Signed)
Utilization Review Completed.Mindy Richard T12/27/2012   

## 2011-07-09 ENCOUNTER — Other Ambulatory Visit: Payer: Self-pay | Admitting: Gastroenterology

## 2011-07-10 ENCOUNTER — Ambulatory Visit
Admission: RE | Admit: 2011-07-10 | Discharge: 2011-07-10 | Disposition: A | Discharge status: Home / Self Care | Payer: Medicare Other | Source: Ambulatory Visit | Attending: Gastroenterology | Admitting: Gastroenterology

## 2011-07-30 ENCOUNTER — Ambulatory Visit (INDEPENDENT_AMBULATORY_CARE_PROVIDER_SITE_OTHER): Payer: Medicare Other | Admitting: Surgery

## 2011-07-30 ENCOUNTER — Encounter (INDEPENDENT_AMBULATORY_CARE_PROVIDER_SITE_OTHER): Payer: Self-pay | Admitting: Surgery

## 2011-07-30 VITALS — BP 132/88 | HR 70 | Temp 98.1°F | Resp 18 | Ht 63.0 in | Wt 180.8 lb

## 2011-07-30 DIAGNOSIS — K8020 Calculus of gallbladder without cholecystitis without obstruction: Secondary | ICD-10-CM

## 2011-07-30 NOTE — Progress Notes (Signed)
Patient ID: Mindy Richard, female   DOB: Apr 21, 1956, 56 y.o.   MRN: [id number]  Chief Complaint  Patient presents with  . Cholelithiasis   PCP MCKEOWN,WILLIAM DAVID, MD, MD   HPI Mindy Richard is a 56 y.o. female.  She was evaluated in the hospital for some chest pain which was somewhat substernal radiated through to her back. A complete cardiac evaluation including a stress test was done and no cardiac etiology was found. She's continued to have some problems with some mild discomfort and nausea. Further evaluation showed that she has multiple gallstones without evidence of acute cholecystitis. Her primary care physician has referred her to [place] for evaluation for possible cholecystectomy. Other than her mild nausea she is asymptomatic today. She is up and having other episodes of pain but continues to have some mild discomfort at the right CVA area. This seems to get worse after she used. She also gets some indigestion when she has fatty or greasy foods which she tends to void because his diabetes. HPI  Past Medical History  Diagnosis Date  . Diabetes mellitus   . Hypertension   . Hypercholesteremia   . GERD (gastroesophageal reflux disease)   . Mental disorder   . Anxiety   . Depression   . Chest pain     Past Surgical History  Procedure Date  . Tonsillectomy   . Shoulder arthroscopy w/ rotator cuff repair 2012    left  . Hand surgery 2001    carpel tunnel - right  . Tubal ligation   . Appendectomy   . Dilation and curettage of uterus   . Cesarean section 1985    Family History  Problem Relation Age of Onset  . Cancer Maternal Aunt     breast    Social History History  Substance Use Topics  . Smoking status: Never Smoker   . Smokeless tobacco: Never Used  . Alcohol Use: No    Allergies  Allergen Reactions  . Codeine Palpitations and Other (See Comments)    Makes her head feel "weird"  . Nsaids Nausea Only    Current Outpatient Prescriptions  Medication Sig  Dispense Refill  . omeprazole (PRILOSEC) 40 MG capsule Daily.      Marland Kitchen zolpidem (AMBIEN) 10 MG tablet Daily.      . bisoprolol-hydrochlorothiazide (ZIAC) 2.5-6.25 MG per tablet Take 1 tablet by mouth daily.       Marland Kitchen buPROPion (WELLBUTRIN XL) 300 MG 24 hr tablet Take 300 mg by mouth daily.       . cholecalciferol (VITAMIN D) 1000 UNITS tablet Take 2,000 Units by mouth daily.       Marland Kitchen ezetimibe (ZETIA) 10 MG tablet Take 10 mg by mouth at bedtime.       . insulin glargine (LANTUS) 100 UNIT/ML injection Inject 48 Units into the skin at bedtime.        . pravastatin (PRAVACHOL) 40 MG tablet Take 40 mg by mouth at bedtime.       . saxagliptin HCl (ONGLYZA) 2.5 MG TABS tablet Take 5 mg by mouth daily.        Marland Kitchen venlafaxine (EFFEXOR) 37.5 MG tablet Take 37.5 mg by mouth 2 (two) times daily.          Review of Systems Review of Systems  Constitutional: Negative for fever, chills and unexpected weight change.  HENT: Negative for hearing loss, congestion, sore throat, trouble swallowing and voice change.   Eyes: Negative for visual disturbance.  Respiratory:  Negative for cough and wheezing.   Cardiovascular: Positive for chest pain. Negative for palpitations and leg swelling.       Chest pain was evaluated and thought non cardiac, more likely due to GB  Gastrointestinal: Negative for nausea, vomiting, abdominal pain, diarrhea, constipation, blood in stool, abdominal distention and anal bleeding.  Genitourinary: Negative for hematuria, vaginal bleeding and difficulty urinating.  Musculoskeletal: Negative for arthralgias.  Skin: Negative for rash and wound.  Neurological: Negative for seizures, syncope and headaches.  Hematological: Negative for adenopathy. Does not bruise/bleed easily.  Psychiatric/Behavioral: Negative for confusion.    Blood pressure 132/88, pulse 70, temperature 98.1 F (36.7 C), temperature source Temporal, resp. rate 18, height 5\' 3"  (1.6 m), weight 180 lb 12.8 oz (82.01  kg).  Physical Exam Physical Exam  Vitals reviewed. Constitutional: She is oriented to person, place, and time. She appears well-developed and well-nourished. No distress.  HENT:  Head: Normocephalic and atraumatic.  Mouth/Throat: Oropharynx is clear and moist.  Eyes: Conjunctivae and EOM are normal. Pupils are equal, round, and reactive to light. No scleral icterus.  Neck: Normal range of motion. Neck supple. No tracheal deviation present. No thyromegaly present.  Cardiovascular: Normal rate, regular rhythm, normal heart sounds and intact distal pulses.  Exam reveals no gallop and no friction rub.   No murmur heard. Pulmonary/Chest: Effort normal and breath sounds normal. No respiratory distress. She has no wheezes. She has no rales.  Abdominal: Soft. Bowel sounds are normal. She exhibits no distension and no mass. There is no tenderness. There is no rebound and no guarding.  Musculoskeletal: Normal range of motion. She exhibits no edema and no tenderness.  Neurological: She is alert and oriented to person, place, and time.  Skin: Skin is warm and dry. No rash noted. She is not diaphoretic. No erythema.  Psychiatric: She has a normal mood and affect. Her behavior is normal. Judgment and thought content normal.    Data Reviewed Have reviewed notes from [facility] (Dr Madilyn Fireman) and hospital and radiology reports  Assessment    Gallstones, likely symptomatic    Plan    Have recommended lap chole. I have discussed the indications for laparoscopic cholecystectomy with her and provided educational material. We have discussed the risks of surgery, including general risks such as bleeding, infection, lung and heart issues etc. We have also discussed the potential for injuries to other organs, bile duct leaks, and other unexpected events. We have also talked about the fact that this may need to be converted to open under certain circumstances. We discussed the typical post op recovery and the  fact that there is a good likelihood of improvement in symptoms and return to normal activity.  She understands this and wishes to proceed to schedule surgery. I believe all of her questions have been answered.  CC: Nadean Corwin, MD, MD Dr Dorena Cookey   Brevyn Ring J 07/30/2011, 2:52 PM

## 2011-07-30 NOTE — Patient Instructions (Signed)
We will schedule surgery to remove your gallbladder 

## 2011-08-18 ENCOUNTER — Encounter (HOSPITAL_COMMUNITY): Payer: Self-pay | Admitting: Pharmacy Technician

## 2011-08-21 ENCOUNTER — Encounter (HOSPITAL_COMMUNITY)
Admission: RE | Admit: 2011-08-21 | Discharge: 2011-08-21 | Disposition: A | Discharge status: Home / Self Care | Payer: Medicare Other | Source: Ambulatory Visit | Attending: Surgery | Admitting: Surgery

## 2011-08-21 ENCOUNTER — Encounter (HOSPITAL_COMMUNITY): Payer: Self-pay

## 2011-08-21 ENCOUNTER — Encounter (HOSPITAL_COMMUNITY)
Admission: RE | Admit: 2011-08-21 | Discharge: 2011-08-21 | Disposition: A | Discharge status: Home / Self Care | Payer: Medicare Other | Source: Ambulatory Visit | Attending: Anesthesiology | Admitting: Anesthesiology

## 2011-08-21 HISTORY — DX: Adverse effect of unspecified anesthetic, initial encounter: T41.45XA

## 2011-08-21 HISTORY — DX: Other complications of anesthesia, initial encounter: T88.59XA

## 2011-08-21 LAB — URINALYSIS, ROUTINE W REFLEX MICROSCOPIC
Glucose, UA: NEGATIVE mg/dL
Protein, ur: NEGATIVE mg/dL
Urobilinogen, UA: 0.2 mg/dL (ref 0.0–1.0)

## 2011-08-21 LAB — COMPREHENSIVE METABOLIC PANEL
Albumin: 3.7 g/dL (ref 3.5–5.2)
BUN: 12 mg/dL (ref 6–23)
Calcium: 9.9 mg/dL (ref 8.4–10.5)
Chloride: 98 mEq/L (ref 96–112)
Creatinine, Ser: 0.78 mg/dL (ref 0.50–1.10)
Total Bilirubin: 0.3 mg/dL (ref 0.3–1.2)

## 2011-08-21 LAB — CBC
HCT: 40.5 % (ref 36.0–46.0)
MCH: 29 pg (ref 26.0–34.0)
MCHC: 34.6 g/dL (ref 30.0–36.0)
MCV: 83.9 fL (ref 78.0–100.0)
RDW: 13.5 % (ref 11.5–15.5)

## 2011-08-21 LAB — DIFFERENTIAL
Basophils Absolute: 0.1 10*3/uL (ref 0.0–0.1)
Basophils Relative: 1 % (ref 0–1)
Lymphocytes Relative: 29 % (ref 12–46)
Monocytes Relative: 7 % (ref 3–12)
Neutro Abs: 6.7 10*3/uL (ref 1.7–7.7)
Neutrophils Relative %: 60 % (ref 43–77)

## 2011-08-21 LAB — SURGICAL PCR SCREEN: MRSA, PCR: NEGATIVE

## 2011-08-21 MED ORDER — CHLORHEXIDINE GLUCONATE 4 % EX LIQD: 1.0000 "application " | Freq: Once | CUTANEOUS | Status: DC

## 2011-08-21 NOTE — Pre-Procedure Instructions (Signed)
20 Mindy Richard  08/21/2011   Your procedure is scheduled on:  09/02/2011  tuesday  Report to [facility] at 0740 AM.  Call this number if you have problems the morning of surgery: [phone]   Remember:   Do not eat food:After Midnight.  May have clear liquids: up to 4 Hours before arrival.  Clear liquids include soda, tea, black coffee, apple or grape juice, broth.  Take these medicines the morning of surgery with A SIP OF WATER: bisoprolol  welbutrin  prilosec  effexor   Do not take diabetes medicines  The day of surgery!!!!!   Do not wear jewelry, make-up or nail polish.  Do not wear lotions, powders, or perfumes. You may wear deodorant.  Do not shave 48 hours prior to surgery.  Do not bring valuables to the hospital.  Contacts, dentures or bridgework may not be worn into surgery.  Leave suitcase in the car. After surgery it may be brought to your room.  For patients admitted to the hospital, checkout time is 11:00 AM the day of discharge.   Patients discharged the day of surgery will not be allowed to drive home.  Name and phone number of your driver: Mindy Richard- spouse cell [phone]  Special Instructions: CHG Shower Use Special Wash: 1/2 bottle night before surgery and 1/2 bottle morning of surgery.   Please read over the following fact sheets that you were given: Pain Booklet, Coughing and Deep Breathing, MRSA Information and Surgical Site Infection Prevention

## 2011-08-22 ENCOUNTER — Other Ambulatory Visit (HOSPITAL_COMMUNITY): Payer: Medicare Other

## 2011-08-28 ENCOUNTER — Other Ambulatory Visit (HOSPITAL_COMMUNITY): Payer: Medicare Other

## 2011-09-01 MED ORDER — CEFAZOLIN SODIUM-DEXTROSE 2-3 GM-% IV SOLR
2.0000 g | INTRAVENOUS | Status: AC
  Administered 2011-09-02: 2 g via INTRAVENOUS
  Filled 2011-09-01: qty 50

## 2011-09-01 MED ORDER — CHLORHEXIDINE GLUCONATE 4 % EX LIQD: 1.0000 "application " | Freq: Once | CUTANEOUS | Status: DC

## 2011-09-02 ENCOUNTER — Encounter (HOSPITAL_COMMUNITY): Payer: Self-pay | Admitting: *Deleted

## 2011-09-02 ENCOUNTER — Ambulatory Visit (HOSPITAL_COMMUNITY): Payer: Medicare Other | Admitting: Anesthesiology

## 2011-09-02 ENCOUNTER — Inpatient Hospital Stay (HOSPITAL_COMMUNITY)
Admission: RE | Admit: 2011-09-02 | Discharge: 2011-09-07 | DRG: 417 | Disposition: A | Discharge status: Home / Self Care | Payer: Medicare Other | Source: Ambulatory Visit | Attending: Surgery | Admitting: Surgery

## 2011-09-02 ENCOUNTER — Encounter (HOSPITAL_COMMUNITY): Payer: Self-pay | Admitting: Anesthesiology

## 2011-09-02 ENCOUNTER — Ambulatory Visit (HOSPITAL_COMMUNITY): Payer: Medicare Other

## 2011-09-02 ENCOUNTER — Encounter (HOSPITAL_COMMUNITY)
Admission: RE | Disposition: A | Discharge status: Home / Self Care | Payer: Self-pay | Source: Ambulatory Visit | Attending: Surgery

## 2011-09-02 DIAGNOSIS — I10 Essential (primary) hypertension: Secondary | ICD-10-CM | POA: Diagnosis present

## 2011-09-02 DIAGNOSIS — Z79899 Other long term (current) drug therapy: Secondary | ICD-10-CM

## 2011-09-02 DIAGNOSIS — Z888 Allergy status to other drugs, medicaments and biological substances status: Secondary | ICD-10-CM

## 2011-09-02 DIAGNOSIS — K219 Gastro-esophageal reflux disease without esophagitis: Secondary | ICD-10-CM | POA: Diagnosis present

## 2011-09-02 DIAGNOSIS — E119 Type 2 diabetes mellitus without complications: Secondary | ICD-10-CM | POA: Diagnosis present

## 2011-09-02 DIAGNOSIS — F329 Major depressive disorder, single episode, unspecified: Secondary | ICD-10-CM | POA: Diagnosis present

## 2011-09-02 DIAGNOSIS — J810 Acute pulmonary edema: Secondary | ICD-10-CM | POA: Diagnosis not present

## 2011-09-02 DIAGNOSIS — K8010 Calculus of gallbladder with chronic cholecystitis without obstruction: Secondary | ICD-10-CM

## 2011-09-02 DIAGNOSIS — K2100 Gastro-esophageal reflux disease with esophagitis, without bleeding: Secondary | ICD-10-CM | POA: Diagnosis present

## 2011-09-02 DIAGNOSIS — E7800 Pure hypercholesterolemia, unspecified: Secondary | ICD-10-CM | POA: Diagnosis present

## 2011-09-02 DIAGNOSIS — F411 Generalized anxiety disorder: Secondary | ICD-10-CM | POA: Diagnosis present

## 2011-09-02 DIAGNOSIS — F3289 Other specified depressive episodes: Secondary | ICD-10-CM | POA: Diagnosis present

## 2011-09-02 HISTORY — DX: Type 2 diabetes mellitus without complications: E11.9

## 2011-09-02 HISTORY — DX: Personal history of other diseases of the digestive system: Z87.19

## 2011-09-02 HISTORY — PX: CHOLECYSTECTOMY: SHX55

## 2011-09-02 LAB — GLUCOSE, CAPILLARY: Glucose-Capillary: 116 mg/dL — ABNORMAL HIGH (ref 70–99)

## 2011-09-02 SURGERY — LAPAROSCOPIC CHOLECYSTECTOMY WITH INTRAOPERATIVE CHOLANGIOGRAM
Anesthesia: General | Site: Abdomen | Wound class: Clean Contaminated

## 2011-09-02 MED ORDER — PROPOFOL 10 MG/ML IV EMUL
INTRAVENOUS | Status: DC | PRN
  Administered 2011-09-02: 150 mg via INTRAVENOUS
  Administered 2011-09-02: 50 mg via INTRAVENOUS

## 2011-09-02 MED ORDER — ALBUTEROL SULFATE (5 MG/ML) 0.5% IN NEBU
INHALATION_SOLUTION | RESPIRATORY_TRACT | Status: AC
  Filled 2011-09-02: qty 0.5

## 2011-09-02 MED ORDER — NEOSTIGMINE METHYLSULFATE 1 MG/ML IJ SOLN
INTRAMUSCULAR | Status: DC | PRN
  Administered 2011-09-02: 4 mg via INTRAVENOUS

## 2011-09-02 MED ORDER — LACTATED RINGERS IV SOLN
INTRAVENOUS | Status: DC | PRN
  Administered 2011-09-02 (×2): via INTRAVENOUS

## 2011-09-02 MED ORDER — HYDROMORPHONE HCL PF 1 MG/ML IJ SOLN
INTRAMUSCULAR | Status: AC
  Administered 2011-09-02: 2 mg via INTRAVENOUS
  Filled 2011-09-02: qty 2

## 2011-09-02 MED ORDER — ZOLPIDEM TARTRATE 5 MG PO TABS
10.0000 mg | ORAL_TABLET | Freq: Every day | ORAL | Status: DC
  Administered 2011-09-04 – 2011-09-06 (×3): 10 mg via ORAL
  Filled 2011-09-02 (×4): qty 2

## 2011-09-02 MED ORDER — ONDANSETRON HCL 4 MG/2ML IJ SOLN
INTRAMUSCULAR | Status: DC | PRN
  Administered 2011-09-02: 4 mg via INTRAVENOUS

## 2011-09-02 MED ORDER — HYDROMORPHONE HCL PF 1 MG/ML IJ SOLN
2.0000 mg | INTRAMUSCULAR | Status: DC | PRN
  Administered 2011-09-02 – 2011-09-04 (×11): 2 mg via INTRAVENOUS
  Filled 2011-09-02 (×10): qty 2

## 2011-09-02 MED ORDER — BISOPROLOL-HYDROCHLOROTHIAZIDE 10-6.25 MG PO TABS
0.5000 | ORAL_TABLET | Freq: Every day | ORAL | Status: DC
  Administered 2011-09-04 – 2011-09-05 (×2): via ORAL
  Administered 2011-09-06: 0.5 via ORAL
  Filled 2011-09-02 (×6): qty 1

## 2011-09-02 MED ORDER — ONDANSETRON HCL 4 MG/2ML IJ SOLN
4.0000 mg | Freq: Four times a day (QID) | INTRAMUSCULAR | Status: DC | PRN
  Administered 2011-09-03 – 2011-09-05 (×3): 4 mg via INTRAVENOUS
  Filled 2011-09-02 (×3): qty 2

## 2011-09-02 MED ORDER — LACTATED RINGERS IV SOLN
INTRAVENOUS | Status: DC
  Administered 2011-09-02: 09:00:00 via INTRAVENOUS

## 2011-09-02 MED ORDER — SODIUM CHLORIDE 0.9 % IV SOLN
INTRAVENOUS | Status: DC | PRN
  Administered 2011-09-02: 10:00:00

## 2011-09-02 MED ORDER — PANTOPRAZOLE SODIUM 40 MG PO TBEC
40.0000 mg | DELAYED_RELEASE_TABLET | Freq: Every day | ORAL | Status: DC
  Administered 2011-09-04 – 2011-09-06 (×3): 40 mg via ORAL
  Filled 2011-09-02 (×3): qty 1

## 2011-09-02 MED ORDER — HEMOSTATIC AGENTS (NO CHARGE) OPTIME
TOPICAL | Status: DC | PRN
  Administered 2011-09-02: 1

## 2011-09-02 MED ORDER — FENTANYL CITRATE 0.05 MG/ML IJ SOLN
INTRAMUSCULAR | Status: DC | PRN
  Administered 2011-09-02: 100 ug via INTRAVENOUS
  Administered 2011-09-02: 50 ug via INTRAVENOUS
  Administered 2011-09-02: 100 ug via INTRAVENOUS

## 2011-09-02 MED ORDER — INSULIN ASPART 100 UNIT/ML ~~LOC~~ SOLN
0.0000 [IU] | Freq: Three times a day (TID) | SUBCUTANEOUS | Status: DC
  Administered 2011-09-04 (×3): 3 [IU] via SUBCUTANEOUS
  Administered 2011-09-05: 2 [IU] via SUBCUTANEOUS
  Administered 2011-09-05: 5 [IU] via SUBCUTANEOUS
  Administered 2011-09-05 – 2011-09-06 (×3): 3 [IU] via SUBCUTANEOUS
  Administered 2011-09-07: 2 [IU] via SUBCUTANEOUS

## 2011-09-02 MED ORDER — HYDROMORPHONE HCL PF 1 MG/ML IJ SOLN
0.2500 mg | INTRAMUSCULAR | Status: DC | PRN
  Administered 2011-09-02 (×4): 0.25 mg via INTRAVENOUS

## 2011-09-02 MED ORDER — EZETIMIBE 10 MG PO TABS
10.0000 mg | ORAL_TABLET | Freq: Every day | ORAL | Status: DC
  Administered 2011-09-02 – 2011-09-06 (×4): 10 mg via ORAL
  Filled 2011-09-02 (×6): qty 1

## 2011-09-02 MED ORDER — ROCURONIUM BROMIDE 100 MG/10ML IV SOLN
INTRAVENOUS | Status: DC | PRN
  Administered 2011-09-02: 50 mg via INTRAVENOUS
  Administered 2011-09-02: 20 mg via INTRAVENOUS

## 2011-09-02 MED ORDER — SODIUM CHLORIDE 0.9 % IR SOLN
Status: DC | PRN
  Administered 2011-09-02: 1000 mL

## 2011-09-02 MED ORDER — INSULIN ASPART 100 UNIT/ML ~~LOC~~ SOLN
0.0000 [IU] | Freq: Every day | SUBCUTANEOUS | Status: DC
  Administered 2011-09-03: 2 [IU] via SUBCUTANEOUS

## 2011-09-02 MED ORDER — BUPROPION HCL ER (XL) 300 MG PO TB24
300.0000 mg | ORAL_TABLET | Freq: Every day | ORAL | Status: DC
  Administered 2011-09-04 – 2011-09-06 (×3): 300 mg via ORAL
  Filled 2011-09-02 (×6): qty 1

## 2011-09-02 MED ORDER — DEXTROSE 5 % IV SOLN
INTRAVENOUS | Status: DC | PRN
  Administered 2011-09-02: 10:00:00 via INTRAVENOUS

## 2011-09-02 MED ORDER — MIDAZOLAM HCL 5 MG/5ML IJ SOLN
INTRAMUSCULAR | Status: DC | PRN
  Administered 2011-09-02: 2 mg via INTRAVENOUS

## 2011-09-02 MED ORDER — 0.9 % SODIUM CHLORIDE (POUR BTL) OPTIME
TOPICAL | Status: DC | PRN
  Administered 2011-09-02: 1000 mL

## 2011-09-02 MED ORDER — SODIUM CHLORIDE 0.9 % IV SOLN
1.5000 g | Freq: Once | INTRAVENOUS | Status: AC
  Administered 2011-09-03: 1.5 g via INTRAVENOUS
  Filled 2011-09-02 (×2): qty 1.5

## 2011-09-02 MED ORDER — ALBUTEROL SULFATE (5 MG/ML) 0.5% IN NEBU: 2.5000 mg | INHALATION_SOLUTION | RESPIRATORY_TRACT | Status: DC | PRN

## 2011-09-02 MED ORDER — ONDANSETRON HCL 4 MG PO TABS: 4.0000 mg | ORAL_TABLET | Freq: Four times a day (QID) | ORAL | Status: DC | PRN

## 2011-09-02 MED ORDER — SIMVASTATIN 20 MG PO TABS
20.0000 mg | ORAL_TABLET | Freq: Every day | ORAL | Status: DC
  Administered 2011-09-02 – 2011-09-06 (×4): 20 mg via ORAL
  Filled 2011-09-02 (×6): qty 1

## 2011-09-02 MED ORDER — BUPIVACAINE HCL (PF) 0.25 % IJ SOLN
INTRAMUSCULAR | Status: DC | PRN
  Administered 2011-09-02: 29 mL

## 2011-09-02 MED ORDER — KCL IN DEXTROSE-NACL 20-5-0.45 MEQ/L-%-% IV SOLN
INTRAVENOUS | Status: DC
  Administered 2011-09-03 (×2): via INTRAVENOUS
  Administered 2011-09-04: 999 mL via INTRAVENOUS
  Administered 2011-09-04 – 2011-09-05 (×2): via INTRAVENOUS
  Filled 2011-09-02 (×12): qty 1000

## 2011-09-02 MED ORDER — OXYCODONE-ACETAMINOPHEN 5-325 MG PO TABS
1.0000 | ORAL_TABLET | ORAL | Status: DC | PRN
  Administered 2011-09-04: 2 via ORAL
  Administered 2011-09-04 – 2011-09-06 (×5): 1 via ORAL
  Filled 2011-09-02 (×2): qty 1
  Filled 2011-09-02: qty 2
  Filled 2011-09-02 (×2): qty 1
  Filled 2011-09-02 (×2): qty 2

## 2011-09-02 MED ORDER — GLYCOPYRROLATE 0.2 MG/ML IJ SOLN
INTRAMUSCULAR | Status: DC | PRN
  Administered 2011-09-02: .5 mg via INTRAVENOUS

## 2011-09-02 MED ORDER — VENLAFAXINE HCL 37.5 MG PO TABS
37.5000 mg | ORAL_TABLET | Freq: Two times a day (BID) | ORAL | Status: DC
  Administered 2011-09-04 – 2011-09-06 (×6): 37.5 mg via ORAL
  Filled 2011-09-02 (×11): qty 1

## 2011-09-02 MED ORDER — LINAGLIPTIN 5 MG PO TABS
5.0000 mg | ORAL_TABLET | Freq: Every day | ORAL | Status: DC
  Administered 2011-09-04 – 2011-09-06 (×3): 5 mg via ORAL
  Filled 2011-09-02 (×6): qty 1

## 2011-09-02 SURGICAL SUPPLY — 52 items
ADH SKN CLS APL DERMABOND .7 (GAUZE/BANDAGES/DRESSINGS) ×1
ADH SKN CLS LQ APL DERMABOND (GAUZE/BANDAGES/DRESSINGS) ×1
APPLIER CLIP ROT 10 11.4 M/L (STAPLE) ×2
APR CLP MED LRG 11.4X10 (STAPLE) ×1
BAG SPEC RTRVL LRG 6X4 10 (ENDOMECHANICALS) ×1
BLADE SURG ROTATE 9660 (MISCELLANEOUS) IMPLANT
CANISTER SUCTION 2500CC (MISCELLANEOUS) ×2 IMPLANT
CHLORAPREP W/TINT 26ML (MISCELLANEOUS) ×2 IMPLANT
CLIP APPLIE ROT 10 11.4 M/L (STAPLE) ×1 IMPLANT
CLOTH BEACON ORANGE TIMEOUT ST (SAFETY) ×2 IMPLANT
COVER MAYO STAND STRL (DRAPES) ×1 IMPLANT
COVER SURGICAL LIGHT HANDLE (MISCELLANEOUS) ×2 IMPLANT
DECANTER SPIKE VIAL GLASS SM (MISCELLANEOUS) ×1 IMPLANT
DERMABOND ADHESIVE PROPEN (GAUZE/BANDAGES/DRESSINGS) ×1
DERMABOND ADVANCED (GAUZE/BANDAGES/DRESSINGS) ×1
DERMABOND ADVANCED .7 DNX12 (GAUZE/BANDAGES/DRESSINGS) ×1 IMPLANT
DERMABOND ADVANCED .7 DNX6 (GAUZE/BANDAGES/DRESSINGS) IMPLANT
DRAPE C-ARM 42X72 X-RAY (DRAPES) ×1 IMPLANT
DRAPE UTILITY 15X26 W/TAPE STR (DRAPE) ×4 IMPLANT
ELECT REM PT RETURN 9FT ADLT (ELECTROSURGICAL) ×2
ELECTRODE REM PT RTRN 9FT ADLT (ELECTROSURGICAL) ×1 IMPLANT
FILTER SMOKE EVAC LAPAROSHD (FILTER) ×1 IMPLANT
GLOVE BIOGEL PI IND STRL 7.0 (GLOVE) IMPLANT
GLOVE BIOGEL PI IND STRL 7.5 (GLOVE) IMPLANT
GLOVE BIOGEL PI INDICATOR 7.0 (GLOVE) ×1
GLOVE BIOGEL PI INDICATOR 7.5 (GLOVE) ×1
GLOVE EUDERMIC 7 POWDERFREE (GLOVE) ×2 IMPLANT
GLOVE SS BIOGEL STRL SZ 6.5 (GLOVE) IMPLANT
GLOVE SUPERSENSE BIOGEL SZ 6.5 (GLOVE) ×1
GLOVE SURG SS PI 7.0 STRL IVOR (GLOVE) ×2 IMPLANT
GOWN PREVENTION PLUS XLARGE (GOWN DISPOSABLE) ×2 IMPLANT
GOWN STRL NON-REIN LRG LVL3 (GOWN DISPOSABLE) ×5 IMPLANT
HEMOSTAT SNOW SURGICEL 2X4 (HEMOSTASIS) ×1 IMPLANT
KIT BASIN OR (CUSTOM PROCEDURE TRAY) ×2 IMPLANT
KIT ROOM TURNOVER OR (KITS) ×2 IMPLANT
NS IRRIG 1000ML POUR BTL (IV SOLUTION) ×2 IMPLANT
PAD ARMBOARD 7.5X6 YLW CONV (MISCELLANEOUS) ×4 IMPLANT
POUCH SPECIMEN RETRIEVAL 10MM (ENDOMECHANICALS) ×2 IMPLANT
SCISSORS LAP 5X35 DISP (ENDOMECHANICALS) ×2 IMPLANT
SET CHOLANGIOGRAPH 5 50 .035 (SET/KITS/TRAYS/PACK) ×1 IMPLANT
SET IRRIG TUBING LAPAROSCOPIC (IRRIGATION / IRRIGATOR) ×2 IMPLANT
SLEEVE Z-THREAD 5X100MM (TROCAR) ×1 IMPLANT
SPECIMEN JAR SMALL (MISCELLANEOUS) ×2 IMPLANT
SUT MNCRL AB 4-0 PS2 18 (SUTURE) ×2 IMPLANT
TOWEL OR 17X24 6PK STRL BLUE (TOWEL DISPOSABLE) ×2 IMPLANT
TOWEL OR 17X26 10 PK STRL BLUE (TOWEL DISPOSABLE) ×2 IMPLANT
TOWEL OR NON WOVEN STRL DISP B (DISPOSABLE) ×1 IMPLANT
TRAY LAPAROSCOPIC (CUSTOM PROCEDURE TRAY) ×2 IMPLANT
TROCAR XCEL BLUNT TIP 100MML (ENDOMECHANICALS) ×2 IMPLANT
TROCAR Z-THREAD FIOS 11X100 BL (TROCAR) ×2 IMPLANT
TROCAR Z-THREAD FIOS 5X100MM (TROCAR) ×3 IMPLANT
WATER STERILE IRR 1000ML POUR (IV SOLUTION) IMPLANT

## 2011-09-02 NOTE — Progress Notes (Signed)
Pt. Resting quietly. VSS. Lungs clear. No difficulty breathing. Dr. Jamey Ripa in PACU to speak with pt. Will transfer to 5100 as ordered.

## 2011-09-02 NOTE — Anesthesia Postprocedure Evaluation (Signed)
  Anesthesia Post-op Note  Patient: Mindy Richard  Procedure(s) Performed: Procedure(s) (LRB): LAPAROSCOPIC CHOLECYSTECTOMY WITH INTRAOPERATIVE CHOLANGIOGRAM (N/A)  Patient Location: PACU  Anesthesia Type: General  Level of Consciousness: awake, alert  and oriented  Airway and Oxygen Therapy: Patient Spontanous Breathing and Patient connected to nasal cannula oxygen  Post-op Pain: mild  Post-op Assessment: Post-op Vital signs reviewed, Patient's Cardiovascular Status Stable, Respiratory Function Stable, Patent Airway, No signs of Nausea or vomiting and Pain level controlled  Post-op Vital Signs: Reviewed and stable  Complications: No apparent anesthesia complications

## 2011-09-02 NOTE — Op Note (Signed)
Mindy Richard [birth year] [id number] 07/30/2011  Preoperative diagnosis: Chronic calculus cholecystitis  Postoperative diagnosis: Same plus choledocholithiasis  Procedure: Laparoscopic cholecystectomy with intraoperative cholangiogram  Surgeon: Currie Paris, MD, FACS    Anesthesia: General  Clinical History and Indications: This patient has known gallstones and comes in today for cholecystectomy.  Description of procedure: The patient was seen in the preoperative area. I reviewed the plans for the procedure with her as well as the risks and complications. She had no further questions.  The patient was taken to the operating room. After satisfactory general endotracheal anesthesia had been obtained the abdomen was prepped and draped. A time out was done.  0.25% plain Marcaine was used at all incisions. I made an umbilical incision, identified the fascia and opened that, and entered the peritoneal cavity under direct vision. A 0 Vicryl pursestring suture was placed and the Hasson cannula was introduced under direct vision and secured with the pursestring. The abdomen was inflated to 15 cm.  The camera was placed and there were no gross abnormalities. The patient was then placed in reverse Trendelenburg and tilted to the left. A 10/11 trocar was placed in the epigastrium and two 5 mm trochars placed laterally all under direct vision.  The gallbladder was encased with fatty tissue. I was able to dissect this off and with appropriate traction make a window behind the cystic duct and identify a long section of the cystic duct and clearly see its junction with the gallbladder. I could see also the cystic artery coming up behind the cystic duct.  An intraoperative cholangiogram was then performed. A Cook catheter was introduced percutaneously and placed in the cystic duct. The cholangiogram showed good filling of the common duct and hepatic radicals and free flow into the duodenum. However  there appeared to be at least one and possibly 2 stones in the distal duct.  The catheter was removed and 3 clips placed on the stay side of the cystic duct. The duct was then divided.  Additional clips are placed on the cystic artery and it was divided. The gallbladder was then removed from below to above the coagulation current of the cautery. It was then placed in a bag to be retrieved later.  The abdomen was irrigated and a check for hemostasis along the bed of the gallbladder made. I left a piece of SNOW.Once everything appeared to be dry we were able to move the camera to the epigastric port and removed the gallbladder through the umbilical port.  The abdomen was reinsufflated and a final check for hemostasis made. There is no evidence of bleeding or bile leakage. The lateral ports were removed under direct vision and there was no bleeding. The umbilical site was closed with a pursestring, watching with the camera in the epigastric port. The abdomen was then deflated through the epigastric port and that was removed. Skin was closed with 4-0 Monocryl subcuticular and Dermabond.  The patient tolerated the procedure well. There were no operative complications. EBL was minimal. All counts were correct.  Currie Paris, MD, FACS 09/02/2011 11:12 AM

## 2011-09-02 NOTE — Anesthesia Procedure Notes (Signed)
Procedure Name: Intubation Date/Time: 09/02/2011 10:12 AM Performed by: Yael Coppess S Pre-anesthesia Checklist: Patient identified, Emergency Drugs available, Suction available, Patient being monitored and Timeout performed Patient Re-evaluated:Patient Re-evaluated prior to inductionOxygen Delivery Method: Circle system utilized Preoxygenation: Pre-oxygenation with 100% oxygen Intubation Type: IV induction Ventilation: Mask ventilation without difficulty Laryngoscope Size: Mac and 3 Grade View: Grade II Tube type: Oral Tube size: 7.5 mm Number of attempts: 2 Airway Equipment and Method: Stylet Placement Confirmation: ETT inserted through vocal cords under direct vision,  positive ETCO2 and breath sounds checked- equal and bilateral Secured at: 22 cm Tube secured with: Tape Dental Injury: Teeth and Oropharynx as per pre-operative assessment

## 2011-09-02 NOTE — Transfer of Care (Signed)
Immediate Anesthesia Transfer of Care Note  Patient: Mindy Richard  Procedure(s) Performed: Procedure(s) (LRB): LAPAROSCOPIC CHOLECYSTECTOMY WITH INTRAOPERATIVE CHOLANGIOGRAM (N/A)  Patient Location: PACU  Anesthesia Type: General  Level of Consciousness: awake, alert  and oriented  Airway & Oxygen Therapy: Patient Spontanous Breathing and Patient connected to nasal cannula oxygen  Post-op Assessment: Report given to PACU RN and Post -op Vital signs reviewed and stable  Post vital signs: Reviewed and stable  Complications: No apparent anesthesia complications

## 2011-09-02 NOTE — Preoperative (Signed)
Beta Blockers   Reason not to administer Beta Blockers:Not Applicable 

## 2011-09-02 NOTE — Consult Note (Signed)
[facility] CON[facility] Reason for consult:CBD stone on IOC  Referring Physician: Dr Jamey Ripa, GI: Dr Madilyn Fireman, PCP: Dr Kizzie Ide is an 56 y.o. female.  HPI: Very nice 56 year old white female who has seen Dr. Dorena Cookey for some time. Had colonoscopy about a year ago that was reported to be normal by the patient. She had vague abdominal pain chest pain and had a cardiac evaluation apparently was negative. She saw Dr. Madilyn Fireman who determined ultrasound that she had gallstones. She was referred to Dr. Jamey Ripa. She underwent laparoscopic cholecystectomy today which she tolerated well. She had what appeared to be 2 common bile duct stones that were small and regular distal common bile duct. Reviewed the films with the radiologist and they did not appear to be air bubbles but were not rounded more irregularly shaped. The patient's liver tests were normal. She is in the room now since no clear liquids and little groggy from pain medication. Her husband is in attendance. She is complaining of slight pain but fairly minimal. She has type 2 diabetes and anxiety and depression. Her cardiac workup was recently negative.  Past Medical History  Diagnosis Date  . Hypertension   . Hypercholesteremia   . GERD (gastroesophageal reflux disease)   . Mental disorder   . Anxiety   . Depression   . Chest pain 2003    saw Dr Myrtis Ser..not heart related  sent to GI dr. and found gallstones.  . Complication of anesthesia 2001 20012    slow to wake up.  . Diabetes mellitus 2003    type 2      Past Surgical History  Procedure Date  . Tonsillectomy   . Shoulder arthroscopy w/ rotator cuff repair 2012    left  . Hand surgery 2001    carpel tunnel - right  . Tubal ligation   . Dilation and curettage of uterus   . Cesarean section 1985    Family History  Problem Relation Age of Onset  . Cancer Maternal Aunt     breast  . Anesthesia problems Neg Hx   . Hypotension Neg Hx     Social History:   reports that she has never smoked. She has never used smokeless tobacco. She reports that she does not drink alcohol or use illicit drugs.  Allergies:  Allergies  Allergen Reactions  . Codeine Palpitations and Other (See Comments)    Makes her head feel "weird"  . Nsaids Nausea Only    Medications;    . albuterol      . bisoprolol-hydrochlorothiazide  0.5 tablet Oral Daily  . buPROPion  300 mg Oral Daily  .  ceFAZolin (ANCEF) IV  2 g Intravenous 60 min Pre-Op  . ezetimibe  10 mg Oral Daily  . insulin aspart  0-15 Units Subcutaneous TID WC  . insulin aspart  0-5 Units Subcutaneous QHS  . linagliptin  5 mg Oral Daily  . pantoprazole  40 mg Oral Q1200  . simvastatin  20 mg Oral q1800  . venlafaxine  37.5 mg Oral BID  . zolpidem  10 mg Oral QHS  . DISCONTD: chlorhexidine  1 application Topical Once   PRN Meds HYDROmorphone, ondansetron (ZOFRAN) IV, ondansetron, oxyCODONE-acetaminophen, DISCONTD: 0.9 % irrigation (POUR BTL), DISCONTD: bupivacaine, DISCONTD: hemostatic agents, DISCONTD: HYDROmorphone, DISCONTD: Omnipaque 300 mg/mL (50 mL) in 0.9% normal saline (50 mL), DISCONTD: sodium chloride irrigation Results for orders placed during the hospital encounter of 09/02/11 (from the past 48 hour(s))  GLUCOSE, CAPILLARY  Status: Abnormal   Collection Time   09/02/11  9:04 AM      Component Value Range Comment   Glucose-Capillary 152 (*) 70 - 99 (mg/dL)   GLUCOSE, CAPILLARY     Status: Abnormal   Collection Time   09/02/11 11:31 AM      Component Value Range Comment   Glucose-Capillary 154 (*) 70 - 99 (mg/dL)   GLUCOSE, CAPILLARY     Status: Abnormal   Collection Time   09/02/11  2:35 PM      Component Value Range Comment   Glucose-Capillary 144 (*) 70 - 99 (mg/dL)    Comment 1 Notify RN               Blood pressure 133/72, pulse 65, temperature 97.5 F (36.4 C), temperature source Oral, resp. rate 15, height 5\' 3"  (1.6 m), weight 82.5 kg (181 lb 14.1 oz), SpO2  98.00%.  Physical exam:   Gen.-alert white female no acute distress Eyes-sclera nonicteric Neck-no lymphadenopathy Lungs-clear Heart-regular rate and rhythm without murmurs or gallops Abdomen-quiet with minimal epigastric tenderness fairly soft  Assessment: 1. CBD stones on intraoperative cholangiogram. Patient will need these removed by ERCP 2. Status post laparoscopic cholecystectomy 3. Type 2 diabetes  Plan: Patient is scheduled for 12:30 tomorrow for ERCP, sphincterotomy and stone extraction by Dr. Dorena Cookey. Extensive discussion with the patient and her husband. The risks of the procedure including bleeding and pancreatitis were discussed as well as the possibility of failure to cannulate. They understand these risks and are willing to proceed.   Shanell Aden JR,Adeja Sarratt L 09/02/2011, 5:25 PM

## 2011-09-02 NOTE — H&P (Signed)
  Mindy Richard is a 56 y.o. female. She was evaluated in the hospital for some chest pain which was somewhat substernal radiated through to her back. A complete cardiac evaluation including a stress test was done and no cardiac etiology was found. She's continued to have some problems with some mild discomfort and nausea. Further evaluation showed that she has multiple gallstones without evidence of acute cholecystitis. Her primary care physician has referred her to [place] for evaluation for possible cholecystectomy. Other than her mild nausea she is asymptomatic today. She is up and having other episodes of pain but continues to have some mild discomfort at the right CVA area. This seems to get worse after she used. She also gets some indigestion when she has fatty or greasy foods which she tends to avoid because of diabetes. PMF:  Past Medical History  Diagnosis Date  . Hypertension   . Hypercholesteremia   . GERD (gastroesophageal reflux disease)   . Mental disorder   . Anxiety   . Depression   . Chest pain 2003    saw Dr Myrtis Ser..not heart related  sent to GI dr. and found gallstones.  . Complication of anesthesia 2001 20012    slow to wake up.  . Diabetes mellitus 2003    type 2     PSH:  Past Surgical History  Procedure Date  . Tonsillectomy   . Shoulder arthroscopy w/ rotator cuff repair 2012    left  . Hand surgery 2001    carpel tunnel - right  . Tubal ligation   . Dilation and curettage of uterus   . Cesarean section 1985   Allergy:  Allergies  Allergen Reactions  . Codeine Palpitations and Other (See Comments)    Makes her head feel "weird"  . Nsaids Nausea Only   Meds:  Current Facility-Administered Medications  Medication Dose Route Frequency Provider Last Rate Last Dose  . ceFAZolin (ANCEF) IVPB 2 g/50 mL premix  2 g Intravenous 60 min Pre-Op Currie Paris, MD      . chlorhexidine (HIBICLENS) 4 % liquid 1 application  1 application Topical Once Currie Paris,  MD      . lactated ringers infusion   Intravenous Continuous Aubery Lapping, MD 50 mL/hr at 09/02/11 0856      ROS _ no change, no intercurrent illnesses since OV last month  PE Vital signs: BP 114/74  Pulse 82  Temp(Src) 98.1 F (36.7 C) (Oral)  Resp 18  SpO2 94% GENERAL:  The patient is alert, oriented, and generally healthy-appearing, NAD. Mood and affect are normal.  HEENT:  The head is normocephalic, the eyes nonicteric, the pupils were round regular and equal. EOMs are normal. Pharynx normal. Dentition good.  NECK:  The neck is supple and there are no masses or thyromegaly.  LUNGS: Normal respirations and clear to auscultation.  HEART: Regular rhythm, with no murmurs rubs or gallops. Pulses are intact carotid dorsalis pedis and posterior tibial. No significant varicosities are noted.   ABDOMEN: Soft, flat, and nontender. No masses or organomegaly is noted. No hernias are noted. Bowel sounds are normal.  EXTREMITIES:  Good range of motion, no edema.  Imp: Symptomatic gallstones  Plan: Lap Chole, IOC today. Reviewed plans with patient and she understands the risks etc and has no other questions.

## 2011-09-02 NOTE — Progress Notes (Signed)
Pt sat up in bed abruptly; having difficulty breathing; not moving air within lung fields; sats dropped to 72; Dr. Sampson Goon at bedside evaluating patient;  pt given albuterol treatment with improvement of sats to 97 percent and moving air throughout lung fields.

## 2011-09-02 NOTE — Progress Notes (Signed)
Pt admitted to 5157. S/P lap chole. Sites unremarkable. IV fluids infusing. Oriented to room. Husband at bedside.

## 2011-09-02 NOTE — Anesthesia Preprocedure Evaluation (Addendum)
Anesthesia Evaluation  Patient identified by MRN, date of birth, ID band Patient awake    Reviewed: Allergy & Precautions, H&P , NPO status , Patient's Chart, lab work & pertinent test results, reviewed documented beta blocker date and time   History of Anesthesia Complications Negative for: history of anesthetic complications  Airway Mallampati: I TM Distance: >3 FB Neck ROM: Full    Dental No notable dental hx. (+) Teeth Intact and Dental Advisory Given   Pulmonary neg pulmonary ROS,  breath sounds clear to auscultation  Pulmonary exam normal       Cardiovascular hypertension, Pt. on medications and Pt. on home beta blockers Rhythm:Regular Rate:Normal  Stress test 12/12: normal perfusion, EF 81%   Neuro/Psych PSYCHIATRIC DISORDERS Anxiety Depression negative neurological ROS     GI/Hepatic Neg liver ROS, GERD-  Medicated and Controlled,  Endo/Other  Diabetes mellitus- (glu 152), Type 2, Insulin Dependent and Oral Hypoglycemic AgentsMorbid obesity  Renal/GU negative Renal ROS     Musculoskeletal   Abdominal (+) + obese,   Peds  Hematology   Anesthesia Other Findings   Reproductive/Obstetrics                           Anesthesia Physical Anesthesia Plan  ASA: III  Anesthesia Plan: General   Post-op Pain Management:    Induction: Intravenous  Airway Management Planned: Oral ETT  Additional Equipment:   Intra-op Plan:   Post-operative Plan: Extubation in OR  Informed Consent: I have reviewed the patients History and Physical, chart, labs and discussed the procedure including the risks, benefits and alternatives for the proposed anesthesia with the patient or authorized representative who has indicated his/her understanding and acceptance.   Dental advisory given  Plan Discussed with: Surgeon, CRNA and Anesthesiologist  Anesthesia Plan Comments: (Plan routine monitors, GETA)        Anesthesia Quick Evaluation

## 2011-09-02 NOTE — Progress Notes (Signed)
Pt tearful and c/o increased right epigastric abdominal pain. Unable to get comfortable in bed. Moving from side to side. Some respiratory discomfort. Dr. Donell Beers notified. Order to continue IV pain med as ordered. And order for Albuterol nebs prn

## 2011-09-03 ENCOUNTER — Ambulatory Visit (HOSPITAL_COMMUNITY): Payer: Medicare Other

## 2011-09-03 ENCOUNTER — Encounter (HOSPITAL_COMMUNITY)
Admission: RE | Disposition: A | Discharge status: Home / Self Care | Payer: Self-pay | Source: Ambulatory Visit | Attending: Surgery

## 2011-09-03 ENCOUNTER — Inpatient Hospital Stay (HOSPITAL_COMMUNITY): Payer: Medicare Other

## 2011-09-03 ENCOUNTER — Encounter (HOSPITAL_COMMUNITY): Payer: Self-pay | Admitting: Gastroenterology

## 2011-09-03 HISTORY — PX: ERCP: SHX5425

## 2011-09-03 LAB — COMPREHENSIVE METABOLIC PANEL
ALT: 51 U/L — ABNORMAL HIGH (ref 0–35)
AST: 58 U/L — ABNORMAL HIGH (ref 0–37)
AST: 53 U/L — ABNORMAL HIGH (ref 0–37)
Albumin: 3 g/dL — ABNORMAL LOW (ref 3.5–5.2)
BUN: 8 mg/dL (ref 6–23)
CO2: 29 mEq/L (ref 19–32)
Calcium: 8.3 mg/dL — ABNORMAL LOW (ref 8.4–10.5)
Chloride: 98 mEq/L (ref 96–112)
Creatinine, Ser: 0.82 mg/dL (ref 0.50–1.10)
Creatinine, Ser: 0.81 mg/dL (ref 0.50–1.10)
GFR calc non Af Amer: 79 mL/min — ABNORMAL LOW (ref >90)
Total Bilirubin: 0.5 mg/dL (ref 0.3–1.2)
Total Protein: 6.6 g/dL (ref 6.0–8.3)

## 2011-09-03 LAB — CBC
Hemoglobin: 12.1 g/dL (ref 12.0–15.0)
MCH: 28.5 pg (ref 26.0–34.0)
MCV: 88.2 fL (ref 78.0–100.0)
Platelets: 258 10*3/uL (ref 150–400)
RBC: 4.24 MIL/uL (ref 3.87–5.11)
WBC: 16.2 10*3/uL — ABNORMAL HIGH (ref 4.0–10.5)

## 2011-09-03 LAB — LIPASE, BLOOD: Lipase: 43 U/L (ref 11–59)

## 2011-09-03 LAB — GLUCOSE, CAPILLARY: Glucose-Capillary: 170 mg/dL — ABNORMAL HIGH (ref 70–99)

## 2011-09-03 LAB — AMYLASE: Amylase: 39 U/L (ref 0–105)

## 2011-09-03 SURGERY — ERCP, WITH INTERVENTION IF INDICATED
Anesthesia: Moderate Sedation

## 2011-09-03 MED ORDER — MIDAZOLAM HCL 10 MG/2ML IJ SOLN
INTRAMUSCULAR | Status: DC | PRN
  Administered 2011-09-03 (×2): 2 mg via INTRAVENOUS

## 2011-09-03 MED ORDER — IOHEXOL 300 MG/ML  SOLN
90.0000 mL | Freq: Once | INTRAMUSCULAR | Status: AC | PRN
  Administered 2011-09-03: 90 mL via INTRAVENOUS

## 2011-09-03 MED ORDER — NALOXONE HCL 0.4 MG/ML IJ SOLN
INTRAMUSCULAR | Status: AC
  Filled 2011-09-03: qty 1

## 2011-09-03 MED ORDER — BUTAMBEN-TETRACAINE-BENZOCAINE 2-2-14 % EX AERO
INHALATION_SPRAY | CUTANEOUS | Status: DC | PRN
  Administered 2011-09-03: 2 via TOPICAL

## 2011-09-03 MED ORDER — SODIUM CHLORIDE 0.9 % IV SOLN
INTRAVENOUS | Status: DC | PRN
  Administered 2011-09-03: 13:00:00

## 2011-09-03 MED ORDER — FLUMAZENIL 0.5 MG/5ML IV SOLN
INTRAVENOUS | Status: DC | PRN
  Administered 2011-09-03: 0.2 mg via INTRAVENOUS

## 2011-09-03 MED ORDER — MIDAZOLAM HCL 10 MG/2ML IJ SOLN
INTRAMUSCULAR | Status: AC
  Filled 2011-09-03: qty 4

## 2011-09-03 MED ORDER — DIPHENHYDRAMINE HCL 50 MG/ML IJ SOLN
INTRAMUSCULAR | Status: DC | PRN
  Administered 2011-09-03: 25 mg via INTRAVENOUS

## 2011-09-03 MED ORDER — DIPHENHYDRAMINE HCL 50 MG/ML IJ SOLN
INTRAMUSCULAR | Status: AC
  Filled 2011-09-03: qty 1

## 2011-09-03 MED ORDER — NALOXONE HCL 0.4 MG/ML IJ SOLN
INTRAMUSCULAR | Status: DC | PRN
  Administered 2011-09-03: .2 mg via INTRAVENOUS

## 2011-09-03 MED ORDER — SODIUM CHLORIDE 0.9 % IV SOLN: INTRAVENOUS | Status: DC

## 2011-09-03 MED ORDER — GLUCAGON HCL (RDNA) 1 MG IJ SOLR
INTRAMUSCULAR | Status: AC
  Filled 2011-09-03: qty 2

## 2011-09-03 MED ORDER — FENTANYL CITRATE 0.05 MG/ML IJ SOLN
INTRAMUSCULAR | Status: DC | PRN
  Administered 2011-09-03 (×2): 25 ug via INTRAVENOUS

## 2011-09-03 MED ORDER — FLUMAZENIL 0.5 MG/5ML IV SOLN
INTRAVENOUS | Status: AC
  Filled 2011-09-03: qty 5

## 2011-09-03 MED ORDER — FENTANYL CITRATE 0.05 MG/ML IJ SOLN
INTRAMUSCULAR | Status: AC
Start: 2011-09-03 — End: 2011-09-03
  Filled 2011-09-03: qty 4

## 2011-09-03 MED ORDER — CHLORHEXIDINE GLUCONATE 0.12 % MT SOLN
15.0000 mL | Freq: Two times a day (BID) | OROMUCOSAL | Status: DC
  Administered 2011-09-03 – 2011-09-05 (×5): 15 mL via OROMUCOSAL
  Filled 2011-09-03 (×8): qty 15

## 2011-09-03 MED ORDER — BIOTENE DRY MOUTH MT LIQD
15.0000 mL | Freq: Two times a day (BID) | OROMUCOSAL | Status: DC
  Administered 2011-09-05 (×2): 15 mL via OROMUCOSAL

## 2011-09-03 NOTE — Progress Notes (Signed)
Patient ID: Mindy Richard, female   DOB: [birth year], 56 y.o.   MRN: [id number] We were called to see this patient after her ERCP.  She underwent stone extraction and immediately after desaturated into the 40-50s.  She was immediately reversed with narcan and another medication.  Once she was reversed, she began complaining of severe abdominal pain.  She denies any nausea or vomiting.  She states her pain is epigastric in nature.  She had a KUB that was negative and STAT labs that show not evidence of post-ERCP pancreatitis at this time.  PE: Abd: soft, tender in epigastrium, but no rebounding, guarding, or peritoneal signs.  She has a few BS, ND.  Incisions c/d/i Heart: regular Lungs: diffuse bilaterally wheezing.  A/P: 1. Acute abdominal pain s/p ERCP  We will obtain a CT scan tonight to make sure she does not have a small perforation that we are not able to see on her ABX yet.  She also needs a chest x-ray to evaluate her lungs given her desaturation and now wheezing.  We have discussed this with Dr. Jamey Ripa.  Dr. Carolynne Edouard evaluated the patient with me.  Dorothye Berni E 3:28 PM 09/03/2011

## 2011-09-03 NOTE — Op Note (Signed)
[facility] [address] [place], [place]  [address]  ERCP PROCEDURE REPORT  PATIENT:  Mindy Richard, Mindy Richard  MR#:  [id number] BIRTHDATE:  06-20-1955  GENDER:  female  ENDOSCOPIST:  Dorena Cookey ASSISTANT:  Anthony Sar, RN, Janae Sauce, RN, Kizzie Bane  PROCEDURE DATE:  09/03/2011 PROCEDURE: ASA CLASS:  INDICATIONS:   common bile duct stone on intraoperative cholangiogram  MEDICATIONS:  Fentanyl 50 mcg, Versed 4 mg, Benadryl 25 mg TOPICAL ANESTHETIC:  Cetacaine spray  DESCRIPTION OF PROCEDURE:   After the risks benefits and alternatives of the procedure were thoroughly explained, informed consent was obtained.  The Pentax [id number] endoscope was introduced through the mouth and advanced to the .  Papilla of Vater had a normal appearance. After 1 or 2 wire cannulations of the pancreatic duct, the common bile duct was selectively cannulated. No pancreatic gram was performed. The common bile duct was of normal caliber with to distal irregular filling defects as seen on the intraoperative cholangiogram. A moderately large sphincterotomy was performed and a 15 mm balloon catheter was used to remove one dark and 1 yellow stone. There was good drainage of clear Amber bile at the termination of the procedure and no residual filling defects <<PROCEDUREIMAGES>>  COMPLICATIONS:  None  ENDOSCOPIC IMPRESSION: 2 distal common bile duct stones removed after sphincterotomy  RECOMMENDATIONS: Observe for complications advance diet as tolerated  ______________________________ Dorena Cookey  CC:  n. eSIGNED:   Dorena Cookey at 09/03/2011 01:30 PM  Rayburn Ma, [phone]

## 2011-09-03 NOTE — Progress Notes (Signed)
1 Day Post-Op  Subjective: Having more pain than usual, mainly RUQ. Aware that she had cbd stone on [facility] and is scheduled for ERCP later today.  Objective: Vital signs in last 24 hours: Temp:  [97.5 F (36.4 C)-98.9 F (37.2 C)] 98.4 F (36.9 C) (04/17 6213) Pulse Rate:  [51-88] 80  (04/17 0608) Resp:  [14-20] 16  (04/17 0865) BP: (96-149)/(34-92) 107/69 mmHg (04/17 0608) SpO2:  [93 %-99 %] 95 % (04/17 0608) Weight:  [181 lb 14.1 oz (82.5 kg)] 181 lb 14.1 oz (82.5 kg) (04/16 1518)   Intake/Output from previous day: 04/16 0701 - 04/17 0700 In: 1843 [P.O.:100; I.V.:1743] Out: 150 [Urine:100; Blood:50] Intake/Output this shift:     General appearance: alert, cooperative and mild distress GI: Soft, but slightly distended, tender RUQ no rebound  Incision: healing well  Lab Results:  No results found for this basename: WBC:2,HGB:2,HCT:2,PLT:2 in the last 72 hours BMET No results found for this basename: NA:2,K:2,CL:2,CO2:2,GLUCOSE:2,BUN:2,CREATININE:2,CALCIUM:2 in the last 72 hours PT/INR No results found for this basename: LABPROT:2,INR:2 in the last 72 hours ABG No results found for this basename: PHART:2,PCO2:2,PO2:2,HCO3:2 in the last 72 hours  MEDS, Scheduled    . albuterol      . ampicillin-sulbactam (UNASYN) 1.5 g IVPB  1.5 g Intravenous Once  . antiseptic oral rinse  15 mL Mouth Rinse q12n4p  . bisoprolol-hydrochlorothiazide  0.5 tablet Oral Daily  . buPROPion  300 mg Oral Daily  .  ceFAZolin (ANCEF) IV  2 g Intravenous 60 min Pre-Op  . chlorhexidine  15 mL Mouth Rinse BID  . ezetimibe  10 mg Oral Daily  . insulin aspart  0-15 Units Subcutaneous TID WC  . insulin aspart  0-5 Units Subcutaneous QHS  . linagliptin  5 mg Oral Daily  . pantoprazole  40 mg Oral Q1200  . simvastatin  20 mg Oral q1800  . venlafaxine  37.5 mg Oral BID  . zolpidem  10 mg Oral QHS  . DISCONTD: chlorhexidine  1 application Topical Once    Studies/Results: Dg Cholangiogram  Operative  09/02/2011  *RADIOLOGY REPORT*  Clinical Data:   Laparoscopic cholecystectomy, gallstones.  INTRAOPERATIVE CHOLANGIOGRAM  Comparison: 07/10/2011  Findings: There appear be two filling defects within the distal common bile duct.  Contrast passes into the small bowel.  No biliary ductal dilatation.  IMPRESSION: Likely two nonobstructing distal common bile duct stones.  These results were discussed with Dr. Jamey Ripa in the operating room at the time of interpretation.  These images were submitted for radiologic interpretation only. Please see the procedural report for the amount of contrast and the fluoroscopy time utilized.  Original Report Authenticated By: Cyndie Chime, M.D.    Assessment: s/p Procedure(s): LAPAROSCOPIC CHOLECYSTECTOMY WITH INTRAOPERATIVE CHOLANGIOGRAM Stable but more than usual pain  Plan: Await ercp, continue pain med, await labs from this am   LOS: 1 day     Currie Paris, MD, [facility], [place] [phone]   09/03/2011 7:57 AM

## 2011-09-03 NOTE — OR Nursing (Signed)
Pt's 02 sats began to decrease soon after ERCP began.  Pt's 02 was raised to 5L nasal cannula.  Pt was noted to be obstructing and chin lift was performed for the duration of the procedure.  After ERCP was completed, pt was transferred to her bed and her 02 sats began to rapidly drop.  Pt was put on 15L 02 on a non-rebreather mask then bagged briefly.Rapid response was called and Dr Madilyn Fireman was notified.  Narcan 0.2 mcg and Romazicon 0.2 mg were given IV with good response.  Pt's 02 sats increased. Pt c/o significant upper abdominal pain.  KUB, CBC, lipase, amylase, and CMET were ordered and performed.

## 2011-09-03 NOTE — Progress Notes (Signed)
After original progress note dictated the patient was transferred to a stretcher bed where she began having desaturation with oxygen levels in the low 70s and continued unresponsiveness. She was given 0.2 mg IV Narcan and 2 mg of Romazicon and was put on 100% oxygen and bagged briefly. She responded to these measures and was able to be switched back to a Ventimask with oxygen saturation is returning to the 90s. She will be transferred back to step down unit for further monitoring after further observation and recovery.

## 2011-09-03 NOTE — Op Note (Signed)
[facility] Progress Note  Subjective: Some right upper quadrant pain requiring narcotics today.  Objective: Vital signs in last 24 hours: Temp:  [97.2 F (36.2 C)-99.1 F (37.3 C)] 97.2 F (36.2 C) (04/17 1124) Pulse Rate:  [65-88] 79  (04/17 1124) Resp:  [14-27] 27  (04/17 1310) BP: (96-145)/(34-84) 145/66 mmHg (04/17 1310) SpO2:  [70 %-99 %] 94 % (04/17 1310) Weight:  [81.647 kg (180 lb)-82.5 kg (181 lb 14.1 oz)] 81.647 kg (180 lb) (04/17 1124) Weight change:    PE: Abdomen soft slightly distended moderate diffuse tenderness primarily around trocar sites  Lab Results: Results for orders placed during the hospital encounter of 09/02/11 (from the past 24 hour(s))  GLUCOSE, CAPILLARY     Status: Abnormal   Collection Time   09/02/11  2:35 PM      Component Value Range   Glucose-Capillary 144 (*) 70 - 99 (mg/dL)   Comment 1 Notify RN    GLUCOSE, CAPILLARY     Status: Normal   Collection Time   09/02/11  6:10 PM      Component Value Range   Glucose-Capillary 99  70 - 99 (mg/dL)   Comment 1 Notify RN    GLUCOSE, CAPILLARY     Status: Abnormal   Collection Time   09/02/11  9:54 PM      Component Value Range   Glucose-Capillary 116 (*) 70 - 99 (mg/dL)  COMPREHENSIVE METABOLIC PANEL     Status: Abnormal   Collection Time   09/03/11  6:52 AM      Component Value Range   Sodium 137  135 - 145 (mEq/L)   Potassium 4.5  3.5 - 5.1 (mEq/L)   Chloride 98  96 - 112 (mEq/L)   CO2 29  19 - 32 (mEq/L)   Glucose, Bld 187 (*) 70 - 99 (mg/dL)   BUN 9  6 - 23 (mg/dL)   Creatinine, Ser 0.34  0.50 - 1.10 (mg/dL)   Calcium 8.7  8.4 - 74.2 (mg/dL)   Total Protein 6.8  6.0 - 8.3 (g/dL)   Albumin 3.1 (*) 3.5 - 5.2 (g/dL)   AST 58 (*) 0 - 37 (U/L)   ALT 51 (*) 0 - 35 (U/L)   Alkaline Phosphatase 106  39 - 117 (U/L)   Total Bilirubin 0.5  0.3 - 1.2 (mg/dL)   GFR calc non Af Amer 79 (*) >90 (mL/min)   GFR calc Af Amer >90  >90 (mL/min)    Studies/Results: Dg Cholangiogram  Operative  09/02/2011  *RADIOLOGY REPORT*  Clinical Data:   Laparoscopic cholecystectomy, gallstones.  INTRAOPERATIVE CHOLANGIOGRAM  Comparison: 07/10/2011  Findings: There appear be two filling defects within the distal common bile duct.  Contrast passes into the small bowel.  No biliary ductal dilatation.  IMPRESSION: Likely two nonobstructing distal common bile duct stones.  These results were discussed with Dr. Jamey Ripa in the operating room at the time of interpretation.  These images were submitted for radiologic interpretation only. Please see the procedural report for the amount of contrast and the fluoroscopy time utilized.  Original Report Authenticated By: Cyndie Chime, M.D.    ERCP: 2 small stones in the distal common bile duct removed after sphincterotomy  Assessment: Distal common bile duct stones removed after sphincterotomy  Plan: Advance diet as tolerated    Mindy Richard C 09/03/2011, 1:26 PM

## 2011-09-03 NOTE — Progress Notes (Signed)
Events of the afternoon reviewed. She had ERCP and then some respiratory depression, and as the narcotics were reversed had significant abdominal pain. She is uncomfortable now, but not severe pain. Most of pain seems around umbilical and epigastric incisions and some in RUQ.  Exam: VS: BP 113/64  Pulse 94  Temp(Src) 100.4 F (38 C) (Axillary)  Resp 24  Ht 5\' 3"  (1.6 m)  Wt 184 lb 11.9 oz (83.8 kg)  BMI 32.73 kg/m2  SpO2 90%  General: alert, oriented NAD, does not appear dyspneic Abdomen: Maybe slightly distended, wounds look ok, seems mildly tender in epigastrium and RUQ, no rebound, abd is fairly soft. Labs Noted - wbc 16K, LFT's stable, CXR atelectasis and poor expansion. CT abdomen pending and patient has been drinking contrast.  Imp: More abdominal pain than usual, but no peritoneal signs now. Resp issues may have been related to sedation for ERCP with some unrecognized sleep apnea issues.  Plan Await CT. If no surgical issues, will monitor overnight in stepdown and make progress decisions in am  Levell Tavano J 09/03/2011 7:51 PM

## 2011-09-04 ENCOUNTER — Encounter (HOSPITAL_COMMUNITY): Payer: Self-pay | Admitting: Gastroenterology

## 2011-09-04 LAB — GLUCOSE, CAPILLARY: Glucose-Capillary: 190 mg/dL — ABNORMAL HIGH (ref 70–99)

## 2011-09-04 LAB — CBC
MCH: 28.3 pg (ref 26.0–34.0)
MCHC: 32.3 g/dL (ref 30.0–36.0)
Platelets: 244 10*3/uL (ref 150–400)
RDW: 14 % (ref 11.5–15.5)

## 2011-09-04 LAB — COMPREHENSIVE METABOLIC PANEL
ALT: 38 U/L — ABNORMAL HIGH (ref 0–35)
Albumin: 2.8 g/dL — ABNORMAL LOW (ref 3.5–5.2)
Calcium: 8.4 mg/dL (ref 8.4–10.5)
GFR calc Af Amer: >90 mL/min (ref >90)
Glucose, Bld: 143 mg/dL — ABNORMAL HIGH (ref 70–99)
Sodium: 135 mEq/L (ref 135–145)
Total Protein: 6.4 g/dL (ref 6.0–8.3)

## 2011-09-04 NOTE — Clinical Documentation Improvement (Signed)
RESPIRATORY FAILURE DOCUMENTATION CLARIFICATION QUERY   THIS DOCUMENT IS NOT A PERMANENT PART OF THE MEDICAL RECORD  TO RESPOND TO THE THIS QUERY, FOLLOW THE INSTRUCTIONS BELOW:  1. If needed, update documentation for the patient's encounter via the notes activity.  2. Access this query again and click edit on the In [employer].  3. After updating, or not, click F2 to complete all highlighted (required) fields concerning your review. Select "additional documentation in the medical record" OR "no additional documentation provided".  4. Click Sign note button.  5. The deficiency will fall out of your In Basket *Please let us know if you are not able to complete this workflow by phone or e-mail (listed below).  Please update your documentation within the medical record to reflect your response to this query.                                                                                     09/04/11  Dear Dr. [facility],  In a better effort to capture your patient's severity of illness, reflect appropriate length of stay and utilization of resources, a review of the patient medical record has revealed the following indicators.    Based on your clinical judgment, please clarify and document in a progress note and/or discharge summary the clinical condition associated with the following supporting information:  In responding to this query please exercise your independent judgment.  The fact that a query is asked, does not imply that any particular answer is desired or expected.  Possible Clinical Conditions  _______Acute Respiratory Failure  _______Acute Respiratory Distress  _______Other Condition  _______Cannot Clinically Determine    Supporting Information:  Risk Factors: Status post Lap Chole and ERCP  Signs&Symptoms: "Desaturation with oxygen levels in the low 70s" per MD notes  "Continued unresponsiveness" per Md notes "Continues to have oxygen  requirement" per 4-19 notes Oxygen Saturations from 88-93% on 3-4lpm/[place] per vital signs on 4/18   Radiology: Chest 1 View on 4-17 Findings: Lung volumes are much lower on the comparison study with  associated basilar airspace disease, worse on the left. No  pneumothorax or effusion. Heart size normal.  IMPRESSION:  Left worse than right basilar airspace disease is likely due to  atelectasis in this low-volume chest.  Treatment: "Given 0.2 mg IV Narcan and 2 mg of Romazicon" per notes "Put on 100% oxygen and bagged briefly" per notes Continuous Monitoring on Step down unit Pulmonary Toilet   Oxygen:  NRB to [place] then back to NRB~ to venturi mask; now [place]@4lpm                    You may use possible, probable, or suspect with inpatient documentation. possible, probable, suspected diagnoses MUST be documented at the time of discharge  Reviewed: Query not answered  Thank You,   Rossie Muskrat RN, BSN  Clinical Documentation Specialist Pager:  [phone] [email or web address] Health Information Management [place]

## 2011-09-04 NOTE — Progress Notes (Signed)
1 Day Post-Op POD 2 lap chole with IOC Subjective: Doing much better.  Weaned to nasal cannula.  CT scan without evidence of perforation after ERCP.  Objective: Vital signs in last 24 hours: Temp:  [97.2 F (36.2 C)-100.4 F (38 C)] 98.5 F (36.9 C) (04/18 0717) Pulse Rate:  [79-94] 94  (04/18 0717) Resp:  [11-36] 17  (04/18 0717) BP: (91-167)/(45-117) 91/71 mmHg (04/18 0717) SpO2:  [70 %-99 %] 91 % (04/18 0717) FiO2 (%):  [40 %-50 %] 50 % (04/18 0717) Weight:  [180 lb (81.647 kg)-184 lb 11.9 oz (83.8 kg)] 184 lb 11.9 oz (83.8 kg) (04/17 1711) Last BM Date: 09/01/11  Intake/Output from previous day: 04/17 0701 - 04/18 0700 In: 3039 [I.V.:3035; IV Piggyback:4] Out: 1025 [Urine:1025] Intake/Output this shift: Total I/O In: 220 [P.O.:120; I.V.:100] Out: 0   General appearance: alert, cooperative and no distress GI: soft, non-tender; bowel sounds normal; no masses,  no organomegaly  Lab Results:   Basename 09/04/11 0430 09/03/11 1400  WBC 14.0* 16.2*  HGB 11.3* 12.1  HCT 35.0* 37.4  PLT 244 258   BMET  Basename 09/04/11 0430 09/03/11 1400  NA 135 136  K 3.8 3.8  CL 99 99  CO2 29 28  GLUCOSE 143* 156*  BUN 6 8  CREATININE 0.76 0.81  CALCIUM 8.4 8.3*   PT/INR No results found for this basename: LABPROT:2,INR:2 in the last 72 hours ABG No results found for this basename: PHART:2,PCO2:2,PO2:2,HCO3:2 in the last 72 hours  Studies/Results: Dg Cholangiogram Operative  09/02/2011  *RADIOLOGY REPORT*  Clinical Data:   Laparoscopic cholecystectomy, gallstones.  INTRAOPERATIVE CHOLANGIOGRAM  Comparison: 07/10/2011  Findings: There appear be two filling defects within the distal common bile duct.  Contrast passes into the small bowel.  No biliary ductal dilatation.  IMPRESSION: Likely two nonobstructing distal common bile duct stones.  These results were discussed with Dr. Jamey Ripa in the operating room at the time of interpretation.  These images were submitted for radiologic  interpretation only. Please see the procedural report for the amount of contrast and the fluoroscopy time utilized.  Original Report Authenticated By: Cyndie Chime, M.D.   Ct Abdomen Pelvis W Contrast  09/03/2011  *RADIOLOGY REPORT*  Clinical Data: Acute to severe abdominal pain status post ERCP. The patient underwent laparoscopic cholecystectomy 09/02/2011.  CT ABDOMEN AND PELVIS WITH CONTRAST  Technique:  Multidetector CT imaging of the abdomen and pelvis was performed following the standard protocol during bolus administration of intravenous contrast.  Contrast: 90mL OMNIPAQUE IOHEXOL 300 MG/ML  SOLN  Comparison: ERCP 09/02/2011 and acute abdominal series 09/03/2011. Abdominal ultrasound 07/10/2011.  Findings: Fairly extensive atelectasis and/or consolidation in the dependent portion of both lower lobes and a small focal opacity (atelectasis versus consolidation) in the lingula.  There is oral contrast in the distal thoracic esophagus.  This can be seen in the setting of gastroesophageal reflux.  The patient is status post cholecystectomy.  Simple appearing fluid in the gallbladder fossa is present, and not unexpected given laparascopic cholecystectomy yesterday on 09/02/2011.  The common bile duct is normal in caliber.  It measures 4 mm at the pancreatic head.  No filling defects are seen within the common bile duct.  The stomach is moderately distended with oral contrast.  Gas is seen in the lumen of the stomach at the level the pylorus. Duodenal wall thickness is normal.  Fat planes surrounding the duodenum are normal.  Oral contrast has progressed into small bowel loops and the proximal colon  at the time of the examination.  No evidence of bowel wall thickening or bowel obstruction.  There is slight gaseous distention of proximal colon.  No evidence of colonic wall thickening.  Colon becomes decompressed near the splenic flexure.  No colonic mass is seen.  There is mild fatty infiltration of the liver.   No focal liver mass or intrahepatic biliary ductal dilatation.  The portal vein is patent.  The spleen is normal in size and enhancement.  The adrenal glands, kidneys, and pancreas are within normal limits. The peripancreatic fat planes are normal.  Abdominal aorta is normal in caliber.  The urinary bladder, uterus, and adnexa/ovaries are within normal limits.  Negative for abdominal or pelvic ascites or lymphadenopathy.  Negative for free intraperitoneal air.  Slight stranding in the infraumbilical midline anterior abdominal wall and more inferiorly in the anterior abdominal wall is nonspecific, but could be related to recent surgery.  There is no evidence of abscess in the abdomen, pelvis, or abdominal wall.  No acute or suspicious bony abnormality.  IMPRESSION:  1.  No complicating features identified status post ERCP to explain the patient's pain.  Specifically, there is no free intraperitoneal air, duodenal thickening, pancreatitis, or unexpected free fluid. 2.  Status post cholecystectomy with small amount of fluid in the cholecystectomy and in the gallbladder fossa, as expected given that surgery was performed yesterday. 3.  Extensive bilateral lower lobe collapse and/or consolidation. Aspiration or pneumonia cannot be excluded. 4.  Mild gaseous distention of the colon, nonspecific.  Mild colonic ileus not excluded. 5. Oral contrast in the distal thoracic esophagus. Gastroesophageal reflux cannot be excluded.  Original Report Authenticated By: Britta Mccreedy, M.D.   Dg Chest Port 1 View  09/03/2011  *RADIOLOGY REPORT*  Clinical Data: Hypoxia.  Status post ERCP today.  PORTABLE CHEST - 1 VIEW  Comparison: Chest 08/21/2011.  Findings: Lung volumes are much lower on the comparison study with associated basilar airspace disease, worse on the left.  No pneumothorax or effusion.  Heart size normal.  IMPRESSION: Left worse than right basilar airspace disease is likely due to atelectasis in this low-volume chest.   Original Report Authenticated By: Bernadene Bell. Maricela Curet, M.D.   Dg Abd Portable 2v  09/03/2011  *RADIOLOGY REPORT*  Clinical Data: Abdominal pain status post ERCP  PORTABLE ABDOMEN - 2 VIEW  Comparison: September 02, 2011  Findings: The stool and bowel gas pattern is within normal limits. No pneumoperitoneum.  A small amount of contrast persists within the intrahepatic biliary ducts which are not dilated.  There is no gross organomegaly.  Residual contrast is also present within the rectum.  The osseous structures are unremarkable.  IMPRESSION: No pneumoperitoneum.  Normal stool and bowel gas pattern.  Original Report Authenticated By: Brandon Melnick, M.D.    Anti-infectives: Anti-infectives     Start     Dose/Rate Route Frequency Ordered Stop   09/02/11 2000   ampicillin-sulbactam (UNASYN) 1.5 g in sodium chloride 0.9 % 50 mL IVPB        1.5 g 100 mL/hr over 30 Minutes Intravenous  Once 09/02/11 1954 09/03/11 1320   09/01/11 1433   ceFAZolin (ANCEF) IVPB 2 g/50 mL premix        2 g 100 mL/hr over 30 Minutes Intravenous 60 min pre-op 09/01/11 1433 09/02/11 1015          Assessment/Plan: s/p Procedure(s) (LRB): ENDOSCOPIC RETROGRADE CHOLANGIOPANCREATOGRAPHY (ERCP) (N/A) Advance diet Leave in stepdown for the day. Slowly improving. Hope for  d/c tomorrow afternoon or Saturday. CT consistent with Aspiration.  Unsure why abdominal pain occurred.    LOS: 2 days    [facility] 09/04/2011

## 2011-09-04 NOTE — Progress Notes (Signed)
UR complete 

## 2011-09-04 NOTE — Progress Notes (Signed)
[facility] PROGRESS NOTE Subjective Pt feels much better.  Tolerating [place] diet w/o pain.    Objective: Vital signs in last 24 hours: Temp:  [98.4 F (36.9 C)-100.4 F (38 C)] 98.4 F (36.9 C) (04/18 1235) Pulse Rate:  [78-94] 78  (04/18 1235) Resp:  [11-36] 17  (04/18 1235) BP: (91-133)/(53-82) 102/53 mmHg (04/18 1235) SpO2:  [88 %-96 %] 91 % (04/18 1235) FiO2 (%):  [40 %-50 %] 50 % (04/18 0717) Weight:  [83.8 kg (184 lb 11.9 oz)] 83.8 kg (184 lb 11.9 oz) (04/17 1711) Last BM Date: 09/01/11  Intake/Output from previous day: 04/17 0701 - 04/18 0700 In: 3039 [I.V.:3035; IV Piggyback:4] Out: 1025 [Urine:1025] Intake/Output this shift: Total I/O In: 1160 [P.O.:360; I.V.:800] Out: 0   PE: General-laughing, make-up on Abd- soft, +BSs, minimal tender  Lab Results:  Basename 09/04/11 0430 09/03/11 1400  WBC 14.0* 16.2*  HGB 11.3* 12.1  HCT 35.0* 37.4  PLT 244 258   BMET  Basename 09/04/11 0430 09/03/11 1400 09/03/11 0652  NA 135 136 137  K 3.8 3.8 4.5  CL 99 99 98  CO2 29 28 29   CREATININE 0.76 0.81 0.82   LFT  Basename 09/04/11 0430 09/03/11 1400 09/03/11 0652  PROT 6.4 6.6 6.8  AST 30 53* 58*  ALT 38* 50* 51*  ALKPHOS 103 108 106  BILITOT 0.6 0.8 0.5  BILIDIR -- -- --  IBILI -- -- --   PT/INR No results found for this basename: LABPROT:3,INR:3 in the last 72 hours PANCREAS  Basename 09/04/11 0430 09/03/11 1400  LIPASE 28 43         Studies/Results: Ct Abdomen Pelvis W Contrast  09/03/2011  *RADIOLOGY REPORT*  Clinical Data: Acute to severe abdominal pain status post ERCP. The patient underwent laparoscopic cholecystectomy 09/02/2011.  CT ABDOMEN AND PELVIS WITH CONTRAST  Technique:  Multidetector CT imaging of the abdomen and pelvis was performed following the standard protocol during bolus administration of intravenous contrast.  Contrast: 90mL OMNIPAQUE IOHEXOL 300 MG/ML  SOLN  Comparison: ERCP 09/02/2011 and acute abdominal series  09/03/2011. Abdominal ultrasound 07/10/2011.  Findings: Fairly extensive atelectasis and/or consolidation in the dependent portion of both lower lobes and a small focal opacity (atelectasis versus consolidation) in the lingula.  There is oral contrast in the distal thoracic esophagus.  This can be seen in the setting of gastroesophageal reflux.  The patient is status post cholecystectomy.  Simple appearing fluid in the gallbladder fossa is present, and not unexpected given laparascopic cholecystectomy yesterday on 09/02/2011.  The common bile duct is normal in caliber.  It measures 4 mm at the pancreatic head.  No filling defects are seen within the common bile duct.  The stomach is moderately distended with oral contrast.  Gas is seen in the lumen of the stomach at the level the pylorus. Duodenal wall thickness is normal.  Fat planes surrounding the duodenum are normal.  Oral contrast has progressed into small bowel loops and the proximal colon at the time of the examination.  No evidence of bowel wall thickening or bowel obstruction.  There is slight gaseous distention of proximal colon.  No evidence of colonic wall thickening.  Colon becomes decompressed near the splenic flexure.  No colonic mass is seen.  There is mild fatty infiltration of the liver.  No focal liver mass or intrahepatic biliary ductal dilatation.  The portal vein is patent.  The spleen is normal in size and enhancement.  The adrenal glands, kidneys, and pancreas  are within normal limits. The peripancreatic fat planes are normal.  Abdominal aorta is normal in caliber.  The urinary bladder, uterus, and adnexa/ovaries are within normal limits.  Negative for abdominal or pelvic ascites or lymphadenopathy.  Negative for free intraperitoneal air.  Slight stranding in the infraumbilical midline anterior abdominal wall and more inferiorly in the anterior abdominal wall is nonspecific, but could be related to recent surgery.  There is no evidence of  abscess in the abdomen, pelvis, or abdominal wall.  No acute or suspicious bony abnormality.  IMPRESSION:  1.  No complicating features identified status post ERCP to explain the patient's pain.  Specifically, there is no free intraperitoneal air, duodenal thickening, pancreatitis, or unexpected free fluid. 2.  Status post cholecystectomy with small amount of fluid in the cholecystectomy and in the gallbladder fossa, as expected given that surgery was performed yesterday. 3.  Extensive bilateral lower lobe collapse and/or consolidation. Aspiration or pneumonia cannot be excluded. 4.  Mild gaseous distention of the colon, nonspecific.  Mild colonic ileus not excluded. 5. Oral contrast in the distal thoracic esophagus. Gastroesophageal reflux cannot be excluded.  Original Report Authenticated By: Britta Mccreedy, M.D.   Dg Chest Port 1 View  09/03/2011  *RADIOLOGY REPORT*  Clinical Data: Hypoxia.  Status post ERCP today.  PORTABLE CHEST - 1 VIEW  Comparison: Chest 08/21/2011.  Findings: Lung volumes are much lower on the comparison study with associated basilar airspace disease, worse on the left.  No pneumothorax or effusion.  Heart size normal.  IMPRESSION: Left worse than right basilar airspace disease is likely due to atelectasis in this low-volume chest.  Original Report Authenticated By: Bernadene Bell. Maricela Curet, M.D.   Dg Abd Portable 2v  09/03/2011  *RADIOLOGY REPORT*  Clinical Data: Abdominal pain status post ERCP  PORTABLE ABDOMEN - 2 VIEW  Comparison: September 02, 2011  Findings: The stool and bowel gas pattern is within normal limits. No pneumoperitoneum.  A small amount of contrast persists within the intrahepatic biliary ducts which are not dilated.  There is no gross organomegaly.  Residual contrast is also present within the rectum.  The osseous structures are unremarkable.  IMPRESSION: No pneumoperitoneum.  Normal stool and bowel gas pattern.  Original Report Authenticated By: Brandon Melnick, M.D.     Medications: I have reviewed the patient's current medications.  Assessment/Plan: 1. GSs/CBD stones. Pt doing much better. ? Of aspiration but improving w/o antibiotics except the preop ABs. AP has resoved. Should be able to be discharged tomorrow.   Eymi Lipuma JR,Jalonda Antigua L 09/04/2011, 3:41 PM

## 2011-09-05 LAB — COMPREHENSIVE METABOLIC PANEL
ALT: 26 U/L (ref 0–35)
AST: 16 U/L (ref 0–37)
Albumin: 2.6 g/dL — ABNORMAL LOW (ref 3.5–5.2)
Alkaline Phosphatase: 102 U/L (ref 39–117)
Potassium: 3.9 mEq/L (ref 3.5–5.1)
Sodium: 136 mEq/L (ref 135–145)
Total Protein: 6.3 g/dL (ref 6.0–8.3)

## 2011-09-05 LAB — CBC
Hemoglobin: 10.9 g/dL — ABNORMAL LOW (ref 12.0–15.0)
MCHC: 33.3 g/dL (ref 30.0–36.0)
RDW: 13.9 % (ref 11.5–15.5)

## 2011-09-05 LAB — GLUCOSE, CAPILLARY: Glucose-Capillary: 203 mg/dL — ABNORMAL HIGH (ref 70–99)

## 2011-09-05 MED ORDER — SODIUM CHLORIDE 0.9 % IV SOLN
3.0000 g | Freq: Four times a day (QID) | INTRAVENOUS | Status: DC
  Administered 2011-09-05 – 2011-09-06 (×4): 3 g via INTRAVENOUS
  Filled 2011-09-05 (×7): qty 3

## 2011-09-05 NOTE — Progress Notes (Signed)
Patient ID: Mindy Richard, female   DOB: 1955/07/16, 56 y.o.   MRN: [id number] 2 Days Post-Op POD 3 lap chole with IOC Subjective: Continues to have oxygen requirement..  Objective: Vital signs in last 24 hours: Temp:  [98.2 F (36.8 C)-98.5 F (36.9 C)] 98.5 F (36.9 C) (04/19 0754) Pulse Rate:  [69-85] 77  (04/19 0754) Resp:  [15-23] 23  (04/19 0754) BP: (102-130)/(53-73) 113/68 mmHg (04/19 0754) SpO2:  [88 %-93 %] 93 % (04/19 0754) Last BM Date: 09/01/11  Intake/Output from previous day: 04/18 0701 - 04/19 0700 In: 2880 [P.O.:480; I.V.:2400] Out: 600 [Urine:600] Intake/Output this shift:    General appearance: alert, cooperative and no distress GI: soft, non-tender; bowel sounds normal; no masses,  no organomegaly  Lab Results:   Basename 09/05/11 0400 09/04/11 0430  WBC 11.5* 14.0*  HGB 10.9* 11.3*  HCT 32.7* 35.0*  PLT 244 244   BMET  Basename 09/05/11 0400 09/04/11 0430  NA 136 135  K 3.9 3.8  CL 100 99  CO2 28 29  GLUCOSE 189* 143*  BUN 4* 6  CREATININE 0.63 0.76  CALCIUM 8.5 8.4   PT/INR No results found for this basename: LABPROT:2,INR:2 in the last 72 hours ABG No results found for this basename: PHART:2,PCO2:2,PO2:2,HCO3:2 in the last 72 hours  Studies/Results: Ct Abdomen Pelvis W Contrast  09/03/2011  *RADIOLOGY REPORT*  Clinical Data: Acute to severe abdominal pain status post ERCP. The patient underwent laparoscopic cholecystectomy 09/02/2011.  CT ABDOMEN AND PELVIS WITH CONTRAST  Technique:  Multidetector CT imaging of the abdomen and pelvis was performed following the standard protocol during bolus administration of intravenous contrast.  Contrast: 90mL OMNIPAQUE IOHEXOL 300 MG/ML  SOLN  Comparison: ERCP 09/02/2011 and acute abdominal series 09/03/2011. Abdominal ultrasound 07/10/2011.  Findings: Fairly extensive atelectasis and/or consolidation in the dependent portion of both lower lobes and a small focal opacity (atelectasis versus  consolidation) in the lingula.  There is oral contrast in the distal thoracic esophagus.  This can be seen in the setting of gastroesophageal reflux.  The patient is status post cholecystectomy.  Simple appearing fluid in the gallbladder fossa is present, and not unexpected given laparascopic cholecystectomy yesterday on 09/02/2011.  The common bile duct is normal in caliber.  It measures 4 mm at the pancreatic head.  No filling defects are seen within the common bile duct.  The stomach is moderately distended with oral contrast.  Gas is seen in the lumen of the stomach at the level the pylorus. Duodenal wall thickness is normal.  Fat planes surrounding the duodenum are normal.  Oral contrast has progressed into small bowel loops and the proximal colon at the time of the examination.  No evidence of bowel wall thickening or bowel obstruction.  There is slight gaseous distention of proximal colon.  No evidence of colonic wall thickening.  Colon becomes decompressed near the splenic flexure.  No colonic mass is seen.  There is mild fatty infiltration of the liver.  No focal liver mass or intrahepatic biliary ductal dilatation.  The portal vein is patent.  The spleen is normal in size and enhancement.  The adrenal glands, kidneys, and pancreas are within normal limits. The peripancreatic fat planes are normal.  Abdominal aorta is normal in caliber.  The urinary bladder, uterus, and adnexa/ovaries are within normal limits.  Negative for abdominal or pelvic ascites or lymphadenopathy.  Negative for free intraperitoneal air.  Slight stranding in the infraumbilical midline anterior abdominal wall and more inferiorly  in the anterior abdominal wall is nonspecific, but could be related to recent surgery.  There is no evidence of abscess in the abdomen, pelvis, or abdominal wall.  No acute or suspicious bony abnormality.  IMPRESSION:  1.  No complicating features identified status post ERCP to explain the patient's pain.   Specifically, there is no free intraperitoneal air, duodenal thickening, pancreatitis, or unexpected free fluid. 2.  Status post cholecystectomy with small amount of fluid in the cholecystectomy and in the gallbladder fossa, as expected given that surgery was performed yesterday. 3.  Extensive bilateral lower lobe collapse and/or consolidation. Aspiration or pneumonia cannot be excluded. 4.  Mild gaseous distention of the colon, nonspecific.  Mild colonic ileus not excluded. 5. Oral contrast in the distal thoracic esophagus. Gastroesophageal reflux cannot be excluded.  Original Report Authenticated By: Britta Mccreedy, M.D.   Dg Chest Port 1 View  09/03/2011  *RADIOLOGY REPORT*  Clinical Data: Hypoxia.  Status post ERCP today.  PORTABLE CHEST - 1 VIEW  Comparison: Chest 08/21/2011.  Findings: Lung volumes are much lower on the comparison study with associated basilar airspace disease, worse on the left.  No pneumothorax or effusion.  Heart size normal.  IMPRESSION: Left worse than right basilar airspace disease is likely due to atelectasis in this low-volume chest.  Original Report Authenticated By: Bernadene Bell. Maricela Curet, M.D.   Dg Abd Portable 2v  09/03/2011  *RADIOLOGY REPORT*  Clinical Data: Abdominal pain status post ERCP  PORTABLE ABDOMEN - 2 VIEW  Comparison: September 02, 2011  Findings: The stool and bowel gas pattern is within normal limits. No pneumoperitoneum.  A small amount of contrast persists within the intrahepatic biliary ducts which are not dilated.  There is no gross organomegaly.  Residual contrast is also present within the rectum.  The osseous structures are unremarkable.  IMPRESSION: No pneumoperitoneum.  Normal stool and bowel gas pattern.  Original Report Authenticated By: Brandon Melnick, M.D.    Anti-infectives: Anti-infectives     Start     Dose/Rate Route Frequency Ordered Stop   09/05/11 0845   Ampicillin-Sulbactam (UNASYN) 3 g in sodium chloride 0.9 % 100 mL IVPB        3 g 100  mL/hr over 60 Minutes Intravenous Every 6 hours 09/05/11 0837     09/02/11 2000   ampicillin-sulbactam (UNASYN) 1.5 g in sodium chloride 0.9 % 50 mL IVPB        1.5 g 100 mL/hr over 30 Minutes Intravenous  Once 09/02/11 1954 09/03/11 1320   09/01/11 1433   ceFAZolin (ANCEF) IVPB 2 g/50 mL premix        2 g 100 mL/hr over 30 Minutes Intravenous 60 min pre-op 09/01/11 1433 09/02/11 1015          Assessment/Plan: s/p Procedure(s) (LRB): ENDOSCOPIC RETROGRADE CHOLANGIOPANCREATOGRAPHY (ERCP) (N/A) Advance diet Continuous pulse ox Pulmonary toilet Slowly improving. CXR in AM Add unasyn since not improving.   LOS: 3 days    [facility] 09/05/2011

## 2011-09-05 NOTE — Progress Notes (Addendum)
Inpatient Diabetes Program Recommendations  AACE/ADA: New Consensus Statement on Inpatient Glycemic Control (2009)  Target Ranges:  Prepandial:   less than 140 mg/dL      Peak postprandial:   less than 180 mg/dL (1-2 hours)      Critically ill patients:  140 - 180 mg/dL   Reason for Visit: CBG's slightly elevated.  Patient started on Heart healthy diet this AM.  Consider restarting 1/2 of patients home dose of Lantus.  Consider adding Lantus 24 units daily.  Also please make diet CHO modified/Heart healthy. Also check A1C to determine pre-hospitalization glycemic control.

## 2011-09-05 NOTE — Progress Notes (Signed)
Called report to [employer] pt going to [phone] via w/c and oxygen at 2l/min with belongings. Notified husband of new room number. No complaints voiced at this time. Beryle Quant

## 2011-09-06 ENCOUNTER — Inpatient Hospital Stay (HOSPITAL_COMMUNITY): Payer: Medicare Other

## 2011-09-06 LAB — GLUCOSE, CAPILLARY: Glucose-Capillary: 164 mg/dL — ABNORMAL HIGH (ref 70–99)

## 2011-09-06 MED ORDER — ALUM & MAG HYDROXIDE-SIMETH 200-200-20 MG/5ML PO SUSP: 30.0000 mL | Freq: Four times a day (QID) | ORAL | Status: DC | PRN

## 2011-09-06 MED ORDER — SODIUM CHLORIDE 0.9 % IJ SOLN: 3.0000 mL | INTRAMUSCULAR | Status: DC | PRN

## 2011-09-06 MED ORDER — PSYLLIUM 95 % PO PACK
1.0000 | PACK | Freq: Two times a day (BID) | ORAL | Status: DC
  Administered 2011-09-06: 1 via ORAL
  Filled 2011-09-06 (×4): qty 1

## 2011-09-06 MED ORDER — OXYCODONE HCL 5 MG PO TABS: 5.0000 mg | ORAL_TABLET | ORAL | Status: AC | PRN

## 2011-09-06 MED ORDER — SODIUM CHLORIDE 0.9 % IJ SOLN
3.0000 mL | Freq: Two times a day (BID) | INTRAMUSCULAR | Status: DC
Start: 2011-09-06 — End: 2011-09-07

## 2011-09-06 MED ORDER — OXYCODONE HCL 5 MG PO TABS
5.0000 mg | ORAL_TABLET | ORAL | Status: DC | PRN
  Administered 2011-09-06 (×2): 5 mg via ORAL
  Administered 2011-09-06 – 2011-09-07 (×2): 10 mg via ORAL
  Filled 2011-09-06: qty 1
  Filled 2011-09-06 (×2): qty 2
  Filled 2011-09-06: qty 1

## 2011-09-06 MED ORDER — FUROSEMIDE 40 MG PO TABS
40.0000 mg | ORAL_TABLET | Freq: Two times a day (BID) | ORAL | Status: DC
  Administered 2011-09-06 – 2011-09-07 (×3): 40 mg via ORAL
  Filled 2011-09-06 (×5): qty 1

## 2011-09-06 MED ORDER — ACETAMINOPHEN 325 MG PO TABS
650.0000 mg | ORAL_TABLET | Freq: Four times a day (QID) | ORAL | Status: DC
  Administered 2011-09-06 (×3): 650 mg via ORAL
  Filled 2011-09-06 (×3): qty 2

## 2011-09-06 MED ORDER — FUROSEMIDE 40 MG PO TABS: 40.0000 mg | ORAL_TABLET | Freq: Every day | ORAL | Status: DC

## 2011-09-06 NOTE — Discharge Instructions (Signed)

## 2011-09-06 NOTE — Progress Notes (Signed)
Mindy Richard [id number] [birth year]  CARE TEAM:  PCP: Nadean Corwin, MD, MD  Outpatient Care Team: Patient Care Team: Nadean Corwin, MD as PCP - General (Internal Medicine) Barrie Folk, MD as Consulting Physician (Gastroenterology) Currie Paris, MD as Consulting Physician (General Surgery)  Inpatient Treatment Team: Treatment Team: Attending Provider: Currie Paris, MD; Registered Nurse: Cindy Hazy, RN; Consulting Physician: Vertell Novak., MD; Technician: Valora Corporal, NT; Consulting Physician: Barrie Folk, MD; Registered Nurse: Bayard Beaver, RN; Technician: Star Age, NT; Respiratory Therapist: Murtis Sink, RRT; Registered Nurse: Wynona Neat, RN; Registered Nurse: Costella Hatcher, RN  Subjective:  +BM/flatus Tolerating solids Pain controlled Still needed oxygen Husband at bedside Occ snores at night.  CRNA very suspicious pt has OSA  Objective:  Vital signs:  Filed Vitals:   09/05/11 1004 09/05/11 2130 09/06/11 0303 09/06/11 0523  BP: 126/69 142/76 123/82 114/66  Pulse: 72 96 73 73  Temp: 98.4 F (36.9 C) 99.3 F (37.4 C) 99.3 F (37.4 C) 98.1 F (36.7 C)  TempSrc: Oral Oral Oral Oral  Resp: 20 20 18 18   Height:      Weight:      SpO2: 95% 96% 95% 96%    Last BM Date: 09/05/11  Intake/Output   Yesterday:  04/19 0701 - 04/20 0700 In: 971 [P.O.:240; I.V.:631; IV Piggyback:100] Out: 4 [Urine:3; Stool:1] This shift:     +6.9 L since admit  Bowel function:  Flatus: y  BM: y  Physical Exam:  General: Pt awake/alert/oriented x4 in no acute distress Eyes: PERRL, normal EOM.  Sclera clear.  No icterus Neuro: CN II-XII intact w/o focal sensory/motor deficits. Lymph: No head/neck/groin lymphadenopathy Psych:  No delerium/psychosis/paranoia.  Alert, calm, smiling HENT: Normocephalic, Mucus membranes moist.  No thrush Neck: Supple, No tracheal deviation Chest: Mostly clear.    No chest wall pain w good excursion CV:  Pulses intact.  Regular rhythm Abdomen: Obese.  Soft.  Nondistended.  Mildly tender at incisions only.  No incarcerated hernias. Ext:  SCDs BLE.  No mjr edema.  No cyanosis Skin: No petechiae / purpurae  Results:   Labs: Results for orders placed during the hospital encounter of 09/02/11 (from the past 48 hour(s))  GLUCOSE, CAPILLARY     Status: Abnormal   Collection Time   09/04/11 12:37 PM      Component Value Range Comment   Glucose-Capillary 183 (*) 70 - 99 (mg/dL)    Comment 1 Documented in Chart      Comment 2 Notify RN     GLUCOSE, CAPILLARY     Status: Abnormal   Collection Time   09/04/11  3:35 PM      Component Value Range Comment   Glucose-Capillary 163 (*) 70 - 99 (mg/dL)    Comment 1 Documented in Chart      Comment 2 Notify RN     GLUCOSE, CAPILLARY     Status: Abnormal   Collection Time   09/04/11  9:08 PM      Component Value Range Comment   Glucose-Capillary 163 (*) 70 - 99 (mg/dL)   CBC     Status: Abnormal   Collection Time   09/05/11  4:00 AM      Component Value Range Comment   WBC 11.5 (*) 4.0 - 10.5 (K/uL)    RBC 3.84 (*) 3.87 - 5.11 (MIL/uL)    Hemoglobin 10.9 (*) 12.0 - 15.0 (g/dL)  HCT 32.7 (*) 36.0 - 46.0 (%)    MCV 85.2  78.0 - 100.0 (fL)    MCH 28.4  26.0 - 34.0 (pg)    MCHC 33.3  30.0 - 36.0 (g/dL)    RDW 11.9  14.7 - 82.9 (%)    Platelets 244  150 - 400 (K/uL)   COMPREHENSIVE METABOLIC PANEL     Status: Abnormal   Collection Time   09/05/11  4:00 AM      Component Value Range Comment   Sodium 136  135 - 145 (mEq/L)    Potassium 3.9  3.5 - 5.1 (mEq/L)    Chloride 100  96 - 112 (mEq/L)    CO2 28  19 - 32 (mEq/L)    Glucose, Bld 189 (*) 70 - 99 (mg/dL)    BUN 4 (*) 6 - 23 (mg/dL)    Creatinine, Ser 5.62  0.50 - 1.10 (mg/dL)    Calcium 8.5  8.4 - 10.5 (mg/dL)    Total Protein 6.3  6.0 - 8.3 (g/dL)    Albumin 2.6 (*) 3.5 - 5.2 (g/dL)    AST 16  0 - 37 (U/L)    ALT 26  0 - 35 (U/L)    Alkaline  Phosphatase 102  39 - 117 (U/L)    Total Bilirubin 0.5  0.3 - 1.2 (mg/dL)    GFR calc non Af Amer >90  >90 (mL/min)    GFR calc Af Amer >90  >90 (mL/min)   GLUCOSE, CAPILLARY     Status: Abnormal   Collection Time   09/05/11  8:14 AM      Component Value Range Comment   Glucose-Capillary 203 (*) 70 - 99 (mg/dL)    Comment 1 Documented in Chart      Comment 2 Notify RN     GLUCOSE, CAPILLARY     Status: Abnormal   Collection Time   09/05/11 12:08 PM      Component Value Range Comment   Glucose-Capillary 133 (*) 70 - 99 (mg/dL)    Comment 1 Notify RN     GLUCOSE, CAPILLARY     Status: Abnormal   Collection Time   09/05/11  5:09 PM      Component Value Range Comment   Glucose-Capillary 154 (*) 70 - 99 (mg/dL)    Comment 1 Notify RN     GLUCOSE, CAPILLARY     Status: Abnormal   Collection Time   09/05/11 10:15 PM      Component Value Range Comment   Glucose-Capillary 171 (*) 70 - 99 (mg/dL)    Comment 1 Notify RN     GLUCOSE, CAPILLARY     Status: Abnormal   Collection Time   09/06/11  7:46 AM      Component Value Range Comment   Glucose-Capillary 164 (*) 70 - 99 (mg/dL)    Comment 1 Notify RN       Imaging / Studies: Dg Chest 2 View  09/06/2011  *RADIOLOGY REPORT*  Clinical Data: Hypoxia.  CHEST - 2 VIEW  Comparison: Plain films of the chest 08/21/2011 and 09/03/2011.  Findings: Patchy bilateral airspace disease has worsened since the most recent study.  There is a new small right pleural effusion. Tiny left pleural effusion is unchanged.  Left IJ sheath is noted.  IMPRESSION: Some increase in patchy bilateral airspace disease with a new small left pleural effusion.  Original Report Authenticated By: Bernadene Bell. Maricela Curet, M.D.    Medications / Allergies: per chart  Antibiotics: Anti-infectives     Start     Dose/Rate Route Frequency Ordered Stop   09/05/11 0900   Ampicillin-Sulbactam (UNASYN) 3 g in sodium chloride 0.9 % 100 mL IVPB        3 g 100 mL/hr over 60 Minutes  Intravenous Every 6 hours 09/05/11 0837     09/02/11 2000   ampicillin-sulbactam (UNASYN) 1.5 g in sodium chloride 0.9 % 50 mL IVPB        1.5 g 100 mL/hr over 30 Minutes Intravenous  Once 09/02/11 1954 09/03/11 1320   09/01/11 1433   ceFAZolin (ANCEF) IVPB 2 g/50 mL premix        2 g 100 mL/hr over 30 Minutes Intravenous 60 min pre-op 09/01/11 1433 09/02/11 1015            Problem List:  Principal Problem:  *Pulmonary edema, acute Active Problems:  Diabetes mellitus  GERD (gastroesophageal reflux disease)  Chronic cholecystitis with calculus s/p lap chole 16April2013  Choledocholithiasis s/p ERCP 17April 2013   Assessment  Cele B Thoen  56 y.o. female  3 Days Post-Op  Procedure(s):  Lap chole IOC 16April ENDOSCOPIC RETROGRADE CHOLANGIOPANCREATOGRAPHY (ERCP) 17April  Recovering but still w hypoxia issues, most likely due to +6.9L pulmon edema  Plan:  -diuresis -D/C IVF -VTE prophylaxis- SCDs, etc -mobilize as tolerated to help recovery --stop antibiotics -outpt OSA workup  D/C Hhme when on room air.  Meeting the other D/C goals - prob tomorrow vs this evening  Ardeth Sportsman, M.D., F.A.C.S. Gastrointestinal and Minimally Invasive Surgery [facility] [address] [place], [place] [address] [phone] Main / Paging [phone] Voice Mail   09/06/2011

## 2011-09-07 NOTE — Discharge Summary (Signed)
Physician Discharge Summary  Patient ID: BEAULAH ROMANEK MRN: [id number] DOB/AGE: [birth year] 56 y.o.  Admit date: 09/02/2011 Discharge date: 09/07/2011  Admission Diagnoses: Chronic cholecystitis with calculus  Diabetes mellitus  GERD (gastroesophageal reflux disease)  Pulmonary edema, acute Discharge Diagnoses:  Principal Problem:  *Chronic cholecystitis with calculus s/p lap chole 16April2013 Active Problems:  Diabetes mellitus  GERD (gastroesophageal reflux disease)  Choledocholithiasis s/p ERCP 17April 2013  Pulmonary edema, acute   Discharged Condition: good  Hospital Course:   The patient came in with biliary colic and probable cholecystitis. She underwent cholecystectomy. She was found to have a common bile duct stone. She wonder underwent ERCP the following day. Gradually her ileus resolved and she was able to advance on her diet. She did have persistent hypoxia. She underwent diuresis and was able to wean on room air.  By the time of discharge, she was walking on the hallways, tolerating oral medications well, adequate pain control, having flatus, eating better. Therefore he thought it was be safe for her to be discharged home  Consults: None  Significant Diagnostic Studies: see chart  Treatments: Lap chole w IOC 4/16 ERCP 4/17  Discharge Exam: Blood pressure 125/63, pulse 70, temperature 98.9 F (37.2 C), temperature source Oral, resp. rate 18, height 5\' 3"  (1.6 m), weight 184 lb 11.9 oz (83.8 kg), SpO2 97.00%.  General: Pt awake/alert/oriented x4 in no major acute distress Eyes: PERRL, normal EOM. Sclera nonicteric Neuro: CN II-XII intact w/o focal sensory/motor deficits. Lymph: No head/neck/groin lymphadenopathy Psych:  No delerium/psychosis/paranoia HENT: Normocephalic, Mucus membranes moist.  No thrush Neck: Supple, No tracheal deviation Chest: No pain.  CTA Bilaterally on RA. Good respiratory excursion. CV:  Pulses intact.  Regular rhythm Abdomen: Soft,  Nondistended.  Min tender at incisions only.  No incarcerated hernias. Ext:  SCDs BLE.  No significant edema.  No cyanosis Skin: No petechiae / purpurae   Disposition: 01-Home or Self Care   Medication List  As of 09/07/2011  9:23 AM   TAKE these medications         bisoprolol-hydrochlorothiazide 10-6.25 MG per tablet   Commonly known as: ZIAC   Take 0.5 tablets by mouth daily.      buPROPion 300 MG 24 hr tablet   Commonly known as: WELLBUTRIN XL   Take 300 mg by mouth daily.      cholecalciferol 1000 UNITS tablet   Commonly known as: VITAMIN D   Take 2,000 Units by mouth daily.      ezetimibe 10 MG tablet   Commonly known as: ZETIA   Take 10 mg by mouth daily.      furosemide 40 MG tablet   Commonly known as: LASIX   Take 1 tablet (40 mg total) by mouth daily.      insulin glargine 100 UNIT/ML injection   Commonly known as: LANTUS   Inject 48 Units into the skin at bedtime.      omeprazole 40 MG capsule   Commonly known as: PRILOSEC   Take 40 mg by mouth Daily.      ONGLYZA 5 MG Tabs tablet   Generic drug: saxagliptin HCl   Take 5 mg by mouth daily.      oxyCODONE 5 MG immediate release tablet   Commonly known as: Oxy IR/ROXICODONE   Take 1-2 tablets (5-10 mg total) by mouth every 4 (four) hours as needed.      pravastatin 40 MG tablet   Commonly known as: PRAVACHOL   Take 40 mg  by mouth at bedtime.      venlafaxine 37.5 MG tablet   Commonly known as: EFFEXOR   Take 37.5 mg by mouth 2 (two) times daily.      zolpidem 10 MG tablet   Commonly known as: AMBIEN   Take 10 mg by mouth at bedtime.           Follow-up Information    Follow up with Currie Paris, MD in 3 weeks.   Contact information:   [facility] [address] [place] [address] [phone]       Follow up with Nadean Corwin, MD. Schedule an appointment as soon as possible for a visit in 2 weeks. (consider workup for probable  sleep apnea)    Contact information:   [address] [facility] [address] [phone]          Signed: Ardeth Sportsman 09/07/2011, 9:23 AM

## 2011-09-07 NOTE — Progress Notes (Signed)
Pt discharged to home accomp by husband.  All discharge instructions reviewed with pt and husband.  Diet, activity, meds and FU appts. Reviewed as well as what to call MD for.  Pt given Rx for Oxycontin and lasix Rx was called into the [facility].  No further questions verbalized about home self care.

## 2011-09-08 ENCOUNTER — Telehealth (INDEPENDENT_AMBULATORY_CARE_PROVIDER_SITE_OTHER): Payer: Self-pay | Admitting: Surgery

## 2011-09-08 NOTE — Telephone Encounter (Signed)
Patient made aware Dr Jamey Ripa out of office until 10/03/11. I offered appt on this date or for patient to see one of his partners earlier. She wants to wait for Dr Jamey Ripa but made aware if she has any problems to call and we will get her in with someone. She agrees with this plan.

## 2011-09-10 ENCOUNTER — Telehealth (INDEPENDENT_AMBULATORY_CARE_PROVIDER_SITE_OTHER): Payer: Self-pay | Admitting: General Surgery

## 2011-09-10 NOTE — Telephone Encounter (Signed)
Pt calling about feeling dizzy and drowsy on furosemide 40 mg.  She reports she is to be on this med for another 4 days.  Upon questioning she is also taking Percocet, but has just stopped it.  I explained the narcotic pain medicine is more likely the reason for the side effects she is describing, as opposed to the diuretic.  Suggested she increase her po fluids, especially with Gatorade or a sports drink.  She understands and will continue the furosemide as ordered.

## 2011-10-03 ENCOUNTER — Encounter (INDEPENDENT_AMBULATORY_CARE_PROVIDER_SITE_OTHER): Payer: Self-pay | Admitting: Surgery

## 2011-10-03 ENCOUNTER — Encounter (INDEPENDENT_AMBULATORY_CARE_PROVIDER_SITE_OTHER): Payer: Self-pay | Admitting: General Surgery

## 2011-10-03 ENCOUNTER — Ambulatory Visit (INDEPENDENT_AMBULATORY_CARE_PROVIDER_SITE_OTHER): Payer: Medicare Other | Admitting: Surgery

## 2011-10-03 VITALS — BP 122/70 | HR 76 | Resp 16 | Ht 63.0 in | Wt 176.0 lb

## 2011-10-03 DIAGNOSIS — Z9889 Other specified postprocedural states: Secondary | ICD-10-CM

## 2011-10-03 NOTE — Progress Notes (Signed)
NAME: Mindy Richard       DOB: [birth year]           DATE: 10/03/2011       ZOX:[id number]   CC: Postop laparoscopic cholecystectomy  HPI:  This patient underwent a laparoscopic cholecystectomy withoperative cholangiogram on 09/02/11. She had post op ERCP for retrieval CBD stones and had some respiratory issues after that, all of which have resolved. She is in for her first postoperative visit. She notes that her incisional pain has resolved. Her preoperative symptoms have improved. She is not having problems with nausea, vomiting, diarrhea, fevers, chills, or urinary symptoms. She is tolerating diet. She feels that she is progressing well and nearly back to normal. PE: General: The patient is alert and appears comfortable, NAD.  Abdomen: Soft and benign. The incisions are healing nicely. There are no apparent problems.  Data reviewed: IOC:  Stones Pathology:  Chronic cholecystitis and cholelithiasis  Impression:  The patient appears to be doing well, with improvement in her symptoms.  Plan:  She may resume full activity and regular diet. She  will followup with [place] on a p.r.n. basis. I did tell her that she may still have some foods that cause indigestion and ask her to call us if there are any questions, problems or concerns.Reviewed issues with sedation etc. She has results pending of a sleep apnea test

## 2011-10-03 NOTE — Patient Instructions (Signed)
If you have any additional surgical procedures, especially with sedation instead of general anesthesia, let them know you had respiratory issues with your ERCP. If your test for sleep apnea is abnormal, be sure anyone giving you any typ anesthesia is aware

## 2011-11-13 ENCOUNTER — Encounter (INDEPENDENT_AMBULATORY_CARE_PROVIDER_SITE_OTHER): Payer: Self-pay

## 2011-12-14 ENCOUNTER — Encounter (HOSPITAL_COMMUNITY): Payer: Self-pay | Admitting: *Deleted

## 2011-12-14 ENCOUNTER — Other Ambulatory Visit: Payer: Self-pay

## 2011-12-14 ENCOUNTER — Emergency Department (HOSPITAL_COMMUNITY): Payer: Medicare Other

## 2011-12-14 ENCOUNTER — Emergency Department (HOSPITAL_COMMUNITY)
Admit: 2011-12-14 | Discharge: 2011-12-14 | Disposition: A | Discharge status: Home / Self Care | Payer: Medicare Other | Attending: Emergency Medicine | Admitting: Emergency Medicine

## 2011-12-14 DIAGNOSIS — R079 Chest pain, unspecified: Secondary | ICD-10-CM | POA: Insufficient documentation

## 2011-12-14 LAB — CBC
Hemoglobin: 14.2 g/dL (ref 12.0–15.0)
MCH: 28.5 pg (ref 26.0–34.0)
MCV: 84.6 fL (ref 78.0–100.0)
Platelets: 362 10*3/uL (ref 150–400)
RBC: 4.99 MIL/uL (ref 3.87–5.11)
WBC: 11.5 10*3/uL — ABNORMAL HIGH (ref 4.0–10.5)

## 2011-12-14 LAB — POCT I-STAT, CHEM 8
BUN: 10 mg/dL (ref 6–23)
Creatinine, Ser: 0.9 mg/dL (ref 0.50–1.10)
Glucose, Bld: 210 mg/dL — ABNORMAL HIGH (ref 70–99)
Hemoglobin: 14.6 g/dL (ref 12.0–15.0)
Potassium: 4.7 mEq/L (ref 3.5–5.1)
Sodium: 139 mEq/L (ref 135–145)

## 2011-12-14 LAB — BASIC METABOLIC PANEL
CO2: 30 mEq/L (ref 19–32)
Calcium: 9.9 mg/dL (ref 8.4–10.5)
Chloride: 99 mEq/L (ref 96–112)
Creatinine, Ser: 0.79 mg/dL (ref 0.50–1.10)
Glucose, Bld: 234 mg/dL — ABNORMAL HIGH (ref 70–99)
Sodium: 139 mEq/L (ref 135–145)

## 2011-12-14 LAB — GLUCOSE, CAPILLARY: Glucose-Capillary: 169 mg/dL — ABNORMAL HIGH (ref 70–99)

## 2011-12-14 LAB — POCT I-STAT TROPONIN I

## 2011-12-14 NOTE — ED Notes (Signed)
Patient reports onset of chest pain yesterday.  She states this is a new pain for her.  She denies any cardiac hx.

## 2012-01-07 ENCOUNTER — Ambulatory Visit: Payer: Medicare Other | Admitting: Cardiovascular Disease

## 2012-01-13 ENCOUNTER — Ambulatory Visit (INDEPENDENT_AMBULATORY_CARE_PROVIDER_SITE_OTHER): Payer: Medicare Other | Admitting: Cardiovascular Disease

## 2012-01-13 ENCOUNTER — Encounter: Payer: Self-pay | Admitting: Cardiovascular Disease

## 2012-01-13 VITALS — BP 128/91 | HR 82 | Ht 63.0 in | Wt 174.0 lb

## 2012-01-13 DIAGNOSIS — E119 Type 2 diabetes mellitus without complications: Secondary | ICD-10-CM

## 2012-01-13 DIAGNOSIS — R079 Chest pain, unspecified: Secondary | ICD-10-CM

## 2012-01-13 NOTE — Progress Notes (Signed)
History of Present Illness: 56 yo WF with history of HTN, HLD, GERD, DM, anxiety/depression who is here today as a new patient for cardiac evaluation. She has had no prior cardiac problems. She tells me that she has been having episodes of chest pain since 2012. She as admitted to [facility] in December 2012 with chest pain and had a normal stress test. Her chest and back pain resolved after cholecystectomy. Over the last few months, she has had multiple episodes of sharp sticking pains above her left breast. These pains last for several seconds up to several minutes. She went to the ED at [facility] and waited for five hours and left from the waiting room before being seen. Nothing seems to make this happen. No associated SOB, diaphoresis, nausea. Always occur at rest.   Primary Care Physician: Nadean Corwin   Past Medical History  Diagnosis Date  . Hypertension   . Hypercholesteremia   . GERD (gastroesophageal reflux disease)   . Mental disorder   . Anxiety   . Depression   . Chest pain 04/2011    saw Dr Myrtis Ser..not heart related  sent to GI dr. and found gallstones.  . Complication of anesthesia 2001; 20012    "slow to wake up"  . Type II diabetes mellitus 2003  . H/O hiatal hernia     Past Surgical History  Procedure Date  . Shoulder arthroscopy w/ rotator cuff repair 2012    left  . Cesarean section 1985  . Cholecystectomy 09/02/11  . Carpal tunnel release 2001    right  . Tonsillectomy ~ 1970  . Dilation and curettage of uterus 1984  . Tubal ligation 1985?  Marland Kitchen Colonoscopy   . Cholecystectomy 09/02/2011    Procedure: LAPAROSCOPIC CHOLECYSTECTOMY WITH INTRAOPERATIVE CHOLANGIOGRAM;  Surgeon: Currie Paris, MD;  Location: [facility] OR;  Service: General;  Laterality: N/A;  . Ercp 09/03/2011    Procedure: ENDOSCOPIC RETROGRADE CHOLANGIOPANCREATOGRAPHY (ERCP);  Surgeon: Barrie Folk, MD;  Location: [facility] ENDOSCOPY;  Service: Endoscopy;  Laterality: N/A;  sphincterotomy and stone  extraction    Current Outpatient Prescriptions  Medication Sig Dispense Refill  . acetaminophen (TYLENOL) 500 MG tablet Take 1,000 mg by mouth every 6 (six) hours as needed. For pain      . bisoprolol-hydrochlorothiazide (ZIAC) 10-6.25 MG per tablet Take 0.5 tablets by mouth daily.      Marland Kitchen buPROPion (WELLBUTRIN XL) 300 MG 24 hr tablet Take 300 mg by mouth daily.       . cholecalciferol (VITAMIN D) 1000 UNITS tablet Take 2,000 Units by mouth daily.       Marland Kitchen ezetimibe (ZETIA) 10 MG tablet Take 10 mg by mouth daily.      . insulin glargine (LANTUS) 100 UNIT/ML injection Inject 40 Units into the skin at bedtime.       Marland Kitchen omeprazole (PRILOSEC) 40 MG capsule Take 40 mg by mouth Daily.       . pravastatin (PRAVACHOL) 40 MG tablet Take 40 mg by mouth at bedtime.       . ranitidine (ZANTAC) 300 MG tablet Take 300 mg by mouth at bedtime. prn      . Saxagliptin-Metformin 2.09-998 MG TB24 Take by mouth. 1 tab twice a day      . venlafaxine (EFFEXOR) 75 MG tablet Take 75 mg by mouth daily.      Marland Kitchen zolpidem (AMBIEN) 10 MG tablet Take 10 mg by mouth at bedtime.  Allergies  Allergen Reactions  . Codeine Palpitations and Other (See Comments)    Makes her head feel "weird"  . Nsaids Other (See Comments)    "upset stomach"    History   Social History  . Marital Status: Married    Spouse Name: N/A    Number of Children: 1  . Years of Education: N/A   Occupational History  . Housewife    Social History Main Topics  . Smoking status: Never Smoker   . Smokeless tobacco: Never Used  . Alcohol Use: No  . Drug Use: No  . Sexually Active: Not Currently   Other Topics Concern  . Not on file   Social History Narrative  . No narrative on file    Family History  Problem Relation Age of Onset  . Cancer Maternal Aunt     breast  . Anesthesia problems Neg Hx   . Hypotension Neg Hx   . Heart attack Father 37    Smoker    Review of Systems:  As stated in the HPI and otherwise negative.     BP 128/91  Pulse 82  Ht 5\' 3"  (1.6 m)  Wt 174 lb (78.926 kg)  BMI 30.82 kg/m2  Physical Examination: General: Well developed, well nourished, NAD HEENT: OP clear, mucus membranes moist SKIN: warm, dry. No rashes. Neuro: No focal deficits Musculoskeletal: Muscle strength 5/5 all ext Psychiatric: Mood and affect normal Neck: No JVD, no carotid bruits, no thyromegaly, no lymphadenopathy. Lungs:Clear bilaterally, no wheezes, rhonci, crackles Cardiovascular: Regular rate and rhythm. No murmurs, gallops or rubs. Abdomen:Soft. Bowel sounds present. Non-tender.  Extremities: No lower extremity edema. Pulses are 2 + in the bilateral DP/PT.  EKG: NSR, rate 82 bpm. Non-specific ST abnormalities.   Assessment and Plan:   1. Chest pain: She has risk factors for CAD including HTN, HLD, DM, family history of CAD. Normal stress test in December 2012. Her symptoms are atypical but I feel that with multiple risk factors for CAD, I need to exclude CAD. Will arrange coronary CTA. She agrees to proceed. Will check BMET today.

## 2012-01-13 NOTE — Patient Instructions (Addendum)
Your physician recommends that you schedule a follow-up appointment in:  3-4 weeks.   Your physician has requested that you have cardiac CT. Cardiac computed tomography (CT) is a painless test that uses an x-ray machine to take clear, detailed pictures of your heart. For further information please visit [email or web address] will be mailed to you.

## 2012-01-14 LAB — BASIC METABOLIC PANEL
Chloride: 97 mEq/L (ref 96–112)
Creatinine, Ser: 0.9 mg/dL (ref 0.4–1.2)
GFR: 68 mL/min (ref >60.00)

## 2012-01-20 ENCOUNTER — Telehealth: Payer: Self-pay | Admitting: Cardiovascular Disease

## 2012-01-20 NOTE — Telephone Encounter (Signed)
I spoke to her insurance company ([employer] [place]) today and they will not authorize a coronary CTA. They have stated that they would pay for a nuclear stress test. I tried to call and discuss this with the pt. But no answer. Can we call and tell her and arrange exercise stress myoview? Thanks, chris

## 2012-01-21 ENCOUNTER — Other Ambulatory Visit: Payer: Self-pay | Admitting: *Deleted

## 2012-01-21 ENCOUNTER — Telehealth: Payer: Self-pay | Admitting: Cardiovascular Disease

## 2012-01-21 DIAGNOSIS — R079 Chest pain, unspecified: Secondary | ICD-10-CM

## 2012-01-21 NOTE — Telephone Encounter (Signed)
Spoke with pt and stress myoview scheduled for January 28, 2012 at 12:30. (see phone note from 9/3).  All instructions for myoview reviewed with pt and she verbalizes understanding.  Copy of instructions also mailed to pt's home address

## 2012-01-21 NOTE — Telephone Encounter (Signed)
Left message on pt's home phone yesterday and today to call office. Cell number states caller is unavailable and to try again later. No voicemail on cell phone.

## 2012-01-21 NOTE — Telephone Encounter (Signed)
Spoke with pt and stress myoview scheduled for January 28, 2012 at 12:30

## 2012-01-21 NOTE — Telephone Encounter (Signed)
Patient returning nurse call, she can be reached at hM#  °

## 2012-01-21 NOTE — Addendum Note (Signed)
Addended by: Dossie Arbour on: 01/21/2012 05:19 PM   Modules accepted: Orders

## 2012-01-28 ENCOUNTER — Ambulatory Visit (HOSPITAL_COMMUNITY): Payer: Medicare Other | Attending: Cardiology | Admitting: Radiology

## 2012-01-28 VITALS — BP 136/73 | Ht 63.0 in | Wt 170.0 lb

## 2012-01-28 DIAGNOSIS — I10 Essential (primary) hypertension: Secondary | ICD-10-CM | POA: Insufficient documentation

## 2012-01-28 DIAGNOSIS — E119 Type 2 diabetes mellitus without complications: Secondary | ICD-10-CM | POA: Insufficient documentation

## 2012-01-28 DIAGNOSIS — R079 Chest pain, unspecified: Secondary | ICD-10-CM | POA: Insufficient documentation

## 2012-01-28 DIAGNOSIS — Z8249 Family history of ischemic heart disease and other diseases of the circulatory system: Secondary | ICD-10-CM | POA: Insufficient documentation

## 2012-01-28 MED ORDER — TECHNETIUM TC 99M SESTAMIBI GENERIC - CARDIOLITE
10.0000 | Freq: Once | INTRAVENOUS | Status: AC | PRN
  Administered 2012-01-28: 10 via INTRAVENOUS

## 2012-01-28 MED ORDER — TECHNETIUM TC 99M SESTAMIBI GENERIC - CARDIOLITE
30.0000 | Freq: Once | INTRAVENOUS | Status: AC | PRN
  Administered 2012-01-28: 30 via INTRAVENOUS

## 2012-01-28 NOTE — Progress Notes (Signed)
[facility] SITE 3 NUCLEAR MED [address] [place] [address] [phone]  Cardiology Nuclear Med Study  Mindy Richard is a 56 y.o. female     MRN : [id number]     DOB: 1956/03/24  Procedure Date: 01/28/2012  Nuclear Med Background Indication for Stress Test:  Evaluation for Ischemia History:  12/12 MPS: NL EF: 81% (-)ischemia  Cardiac Risk Factors: Family History - CAD, Hypertension, IDDM Type 2 and Lipids  Symptoms:  Chest Pain   Nuclear Pre-Procedure Caffeine/Decaff Intake:  Had Caffeine Free Diet Rite @ 8:15 AM NPO After: 8:30am   Lungs:  clear O2 Sat: 96% on room air. IV 0.9% NS with Angio Cath:  22g  IV Site: R Antecubital  IV Started by:  Bonnita Levan, RN  Chest Size (in):  42 Cup Size: DD  Height: 5\' 3"  (1.6 m)  Weight:  170 lb (77.111 kg)  BMI:  Body mass index is 30.11 kg/(m^2). Tech Comments:  BS @ 11:30 AM= 133. Patient did not take any Insulin or Diabetic Meds this AM    Nuclear Med Study 1 or 2 day study: 1 day  Stress Test Type:  Stress  Reading MD: Willa Rough, MD  Order Authorizing Provider:  Verne Carrow, MD  Resting Radionuclide: Technetium 6m Sestamibi  Resting Radionuclide Dose: 11.0 mCi   Stress Radionuclide:  Technetium 31m Sestamibi  Stress Radionuclide Dose: 33.0 mCi           Stress Protocol Rest HR: 78 Stress HR: 144  Rest BP: 100/70 Stress BP: 167/78  Exercise Time (min): 6:30 METS: 7.0   Predicted Max HR: 152 bpm % Max HR: 85.53 bpm Rate Pressure Product: 32440   Dose of Adenosine (mg):  n/a Dose of Lexiscan: n/a mg  Dose of Atropine (mg): n/a Dose of Dobutamine: n/a mcg/kg/min (at max HR)  Stress Test Technologist: Milana Na, EMT-P  Nuclear Technologist:  Domenic Polite, CNMT     Rest Procedure:  Myocardial perfusion imaging was performed at rest 45 minutes following the intravenous administration of Technetium 38m Tetrofosmin. Rest ECG: NSR - Normal EKG  Stress Procedure:  The patient  performed treadmill exercise using a Bruce  Protocol for 6:30 minutes. The patient stopped due to fatigue, sob, and denied any chest pain.  There were + significant ST-T wave changes.  Technetium 30m Tetrofosmin was injected at peak exercise and myocardial perfusion imaging was performed after a brief delay. Stress ECG: (+) ST,T changes during peak exercise  QPS Raw Data Images:  Normal; no motion artifact; normal heart/lung ratio. Stress Images:  Normal homogeneous uptake in all areas of the myocardium. Rest Images:  Normal homogeneous uptake in all areas of the myocardium. Subtraction (SDS):  No evidence of ischemia. Transient Ischemic Dilatation (Normal <1.22):  0.88 Lung/Heart Ratio (Normal <0.45):  0.40  Quantitative Gated Spect Images QGS EDV:  44 ml QGS ESV:  10 ml  Impression Exercise Capacity:  Fair exercise capacity. BP Response:  Normal blood pressure response. Clinical Symptoms:  shortness of breath ECG Impression:  See comments below Comparison with Prior Nuclear Study: No images to compare  Overall Impression:  Normal stress nuclear study.  There are mild nonspecific ST changes at rest. While walking on the treadmill these ST changes became more marked. However, the nuclear images are normal. There is no evidence of scar or ischemia.  LV Ejection Fraction: 76%.  LV Wall Motion:  Normal Wall Motion  Willa Rough, MD

## 2012-02-12 ENCOUNTER — Ambulatory Visit (INDEPENDENT_AMBULATORY_CARE_PROVIDER_SITE_OTHER): Payer: Medicare Other | Admitting: Cardiovascular Disease

## 2012-02-12 ENCOUNTER — Encounter: Payer: Self-pay | Admitting: Cardiovascular Disease

## 2012-02-12 VITALS — BP 121/81 | HR 75 | Ht 63.0 in | Wt 173.0 lb

## 2012-02-12 DIAGNOSIS — R079 Chest pain, unspecified: Secondary | ICD-10-CM

## 2012-02-12 NOTE — Patient Instructions (Addendum)
Your physician recommends that you schedule a follow-up appointment  As needed with Dr. McAlhany  

## 2012-02-12 NOTE — Progress Notes (Signed)
History of Present Illness: 56 yo WF with history of HTN, HLD, GERD, DM, anxiety/depression who is here today for cardiac follow up. I saw her as a new patient for cardiac evaluation 01/13/12.  She has had no prior cardiac problems. She told me that she has been having episodes of chest pain since 2012. She as admitted to [facility] in December 2012 with chest pain and had a normal stress test. Her chest and back pain resolved after cholecystectomy. Over the last few months, she has had multiple episodes of sharp sticking pains above her left breast. These pains last for several seconds up to several minutes. She went to the ED at [facility] and waited for five hours and left from the waiting room before being seen. Nothing seems to make this happen. No associated SOB, diaphoresis, nausea. Always occur at rest. I planned a Coronary CTA but insurance denied this.  I arranged a stress myoview on 01/28/12 which showed normal LV size and function with no ischemia.   She is here today for follow up. Feeling well. No recurrence of chest pain. No SOB   Primary Care Physician: Oneta Rack  Last Lipid Profile: Followed by primary care   Past Medical History  Diagnosis Date  . Hypertension   . Hypercholesteremia   . GERD (gastroesophageal reflux disease)   . Mental disorder   . Anxiety   . Depression   . Chest pain 04/2011    saw Dr Myrtis Ser..not heart related  sent to GI dr. and found gallstones.  . Complication of anesthesia 2001; 20012    "slow to wake up"  . Type II diabetes mellitus 2003  . H/O hiatal hernia     Past Surgical History  Procedure Date  . Shoulder arthroscopy w/ rotator cuff repair 2012    left  . Cesarean section 1985  . Cholecystectomy 09/02/11  . Carpal tunnel release 2001    right  . Tonsillectomy ~ 1970  . Dilation and curettage of uterus 1984  . Tubal ligation 1985?  Marland Kitchen Colonoscopy   . Cholecystectomy 09/02/2011    Procedure: LAPAROSCOPIC CHOLECYSTECTOMY WITH INTRAOPERATIVE  CHOLANGIOGRAM;  Surgeon: Currie Paris, MD;  Location: [facility] OR;  Service: General;  Laterality: N/A;  . Ercp 09/03/2011    Procedure: ENDOSCOPIC RETROGRADE CHOLANGIOPANCREATOGRAPHY (ERCP);  Surgeon: Barrie Folk, MD;  Location: [facility] ENDOSCOPY;  Service: Endoscopy;  Laterality: N/A;  sphincterotomy and stone extraction    Current Outpatient Prescriptions  Medication Sig Dispense Refill  . acetaminophen (TYLENOL) 500 MG tablet Take 1,000 mg by mouth every 6 (six) hours as needed. For pain      . bisoprolol-hydrochlorothiazide (ZIAC) 10-6.25 MG per tablet Take 0.5 tablets by mouth daily.      Marland Kitchen buPROPion (WELLBUTRIN XL) 300 MG 24 hr tablet Take 300 mg by mouth daily.       . cholecalciferol (VITAMIN D) 1000 UNITS tablet Take 2,000 Units by mouth daily.       Marland Kitchen ezetimibe (ZETIA) 10 MG tablet Take 10 mg by mouth daily.      . insulin glargine (LANTUS) 100 UNIT/ML injection Inject 40 Units into the skin at bedtime.       Marland Kitchen omeprazole (PRILOSEC) 40 MG capsule Take 40 mg by mouth Daily.       . pravastatin (PRAVACHOL) 40 MG tablet Take 40 mg by mouth at bedtime.       . ranitidine (ZANTAC) 300 MG tablet Take 300 mg by mouth at bedtime. prn      .  Saxagliptin-Metformin 2.09-998 MG TB24 Take by mouth. 1 tab twice a day      . venlafaxine (EFFEXOR) 75 MG tablet Take 75 mg by mouth daily.      Marland Kitchen zolpidem (AMBIEN) 10 MG tablet Take 10 mg by mouth at bedtime.         Allergies  Allergen Reactions  . Codeine Palpitations and Other (See Comments)    Makes her head feel "weird"  . Nsaids Other (See Comments)    "upset stomach"    History   Social History  . Marital Status: Married    Spouse Name: N/A    Number of Children: 1  . Years of Education: N/A   Occupational History  . Housewife    Social History Main Topics  . Smoking status: Never Smoker   . Smokeless tobacco: Never Used  . Alcohol Use: No  . Drug Use: No  . Sexually Active: Not Currently   Other Topics Concern  . Not on file    Social History Narrative  . No narrative on file    Family History  Problem Relation Age of Onset  . Cancer Maternal Aunt     breast  . Anesthesia problems Neg Hx   . Hypotension Neg Hx   . Heart attack Father 53    Smoker    Review of Systems:  As stated in the HPI and otherwise negative.   BP 121/81  Pulse 75  Ht 5\' 3"  (1.6 m)  Wt 173 lb (78.472 kg)  BMI 30.65 kg/m2  Physical Examination: General: Well developed, well nourished, NAD HEENT: OP clear, mucus membranes moist SKIN: warm, dry. No rashes. Neuro: No focal deficits Musculoskeletal: Muscle strength 5/5 all ext Psychiatric: Mood and affect normal Neck: No JVD, no carotid bruits, no thyromegaly, no lymphadenopathy. Lungs:Clear bilaterally, no wheezes, rhonci, crackles Cardiovascular: Regular rate and rhythm. No murmurs, gallops or rubs. Abdomen:Soft. Bowel sounds present. Non-tender.  Extremities: No lower extremity edema. Pulses are 2 + in the bilateral DP/PT.  EKG:  Stress myoview, exercise 01/28/12:  Stress Procedure: The patient performed treadmill exercise using a Bruce Protocol for 6:30 minutes. The patient stopped due to fatigue, sob, and denied any chest pain. There were + significant ST-T wave changes. Technetium 71m Tetrofosmin was injected at peak exercise and myocardial perfusion imaging was performed after a brief delay.  Stress ECG: (+) ST,T changes during peak exercise  QPS  Raw Data Images: Normal; no motion artifact; normal heart/lung ratio.  Stress Images: Normal homogeneous uptake in all areas of the myocardium.  Rest Images: Normal homogeneous uptake in all areas of the myocardium.  Subtraction (SDS): No evidence of ischemia.  Transient Ischemic Dilatation (Normal <1.22): 0.88  Lung/Heart Ratio (Normal <0.45): 0.40  Quantitative Gated Spect Images  QGS EDV: 44 ml  QGS ESV: 10 ml  Impression  Exercise Capacity: Fair exercise capacity.  BP Response: Normal blood pressure response.    Clinical Symptoms: shortness of breath  ECG Impression: See comments below  Comparison with Prior Nuclear Study: No images to compare  Overall Impression: Normal stress nuclear study. There are mild nonspecific ST changes at rest. While walking on the treadmill these ST changes became more marked. However, the nuclear images are normal. There is no evidence of scar or ischemia.  LV Ejection Fraction: 76%. LV Wall Motion: Normal Wall Motion   Assessment and Plan:   1. Chest pain: Atypical pain. Stress test without ischemia. This is most likely GI related. Continue PPI. No  further cardiac workup at this time.

## 2012-03-31 ENCOUNTER — Other Ambulatory Visit (HOSPITAL_COMMUNITY): Payer: Self-pay | Admitting: Internal Medicine

## 2012-03-31 ENCOUNTER — Ambulatory Visit (HOSPITAL_COMMUNITY)
Admission: RE | Admit: 2012-03-31 | Discharge: 2012-03-31 | Disposition: A | Discharge status: Home / Self Care | Payer: Medicare Other | Source: Ambulatory Visit | Attending: Internal Medicine | Admitting: Internal Medicine

## 2012-03-31 DIAGNOSIS — M79609 Pain in unspecified limb: Secondary | ICD-10-CM | POA: Insufficient documentation

## 2013-02-14 ENCOUNTER — Encounter (INDEPENDENT_AMBULATORY_CARE_PROVIDER_SITE_OTHER): Payer: Self-pay

## 2013-04-03 ENCOUNTER — Encounter: Payer: Self-pay | Admitting: Internal Medicine

## 2013-04-03 DIAGNOSIS — E1169 Type 2 diabetes mellitus with other specified complication: Secondary | ICD-10-CM | POA: Insufficient documentation

## 2013-04-03 DIAGNOSIS — E559 Vitamin D deficiency, unspecified: Secondary | ICD-10-CM | POA: Insufficient documentation

## 2013-04-03 DIAGNOSIS — Z79899 Other long term (current) drug therapy: Secondary | ICD-10-CM | POA: Insufficient documentation

## 2013-04-03 NOTE — Patient Instructions (Addendum)
Continue diet & medications same. As discussed. Further disposition pending lab results.   Type 1 Diabetes Mellitus, Adult Type 1 diabetes mellitus, often simply referred to as diabetes, is a long-term (chronic) disease. It occurs when the islet cells in the pancreas that make insulin (a hormone) are destroyed and can no longer make insulin. Insulin is needed to move sugars from food into the tissue cells. The tissue cells use the sugars for energy. In people with type 1 diabetes, the sugars build up in the blood instead of going into the tissue cells. As a result, high blood sugar (hyperglycemia) develops. Without insulin, the body breaks down fat cells for the needed energy. This breakdown of fat cells produces acid chemicals (ketones), which increases the acid levels in the body. The effect of either high ketone or sugar (glucose) levels can be life-threatening.  Type 1 diabetes was also previously called juvenile diabetes. It most often occurs before the age of 60, but it can occur at any age. RISK FACTORS A person is predisposed to developing type 1 diabetes if someone in his or her family has the disease and is exposed to certain additional environmental triggers.  SYMPTOMS  Symptoms of type 1 diabetes may develop gradually over days to weeks or suddenly. The symptoms occur due to hyperglycemia. The symptoms can include:   Increased thirst (polydipsia).  Increased urination (polyuria).  Increased urination during the night (nocturia).  Weight loss. This weight loss may be rapid.  Frequent, recurring infections.  Tiredness (fatigue).  Weakness.  Vision changes, such as blurred vision.  Fruity smell to your breath.  Abdominal pain.  Nausea or vomiting. DIAGNOSIS  Type 1 diabetes is diagnosed when symptoms of diabetes are present and when blood glucose levels are increased. Your blood glucose level may be checked by one or more of the following blood tests:  A fasting blood  glucose test. You will not be allowed to eat for at least 8 hours before a blood sample is taken.  A random blood glucose test. Your blood glucose is checked at any time of the day regardless of when you ate.  A hemoglobin A1c blood glucose test. A hemoglobin A1c test provides information about blood glucose control over the previous 3 months. TREATMENT  Although type 1 diabetes cannot be prevented, it can be managed with insulin, diet, and exercise.  You will need to take insulin daily to keep blood glucose in the desired range.  You will need to match insulin dosing with exercise and healthy food choices. The treatment goal is to maintain the before-meal blood sugar (preprandial glucose) level at 70 130 mg/dL.  HOME CARE INSTRUCTIONS   Have your hemoglobin A1c level checked twice a year.  Perform daily blood glucose monitoring as directed by your caregiver.  Monitor urine ketones when you are ill and as directed by your caregiver.  Take your insulin as directed by your caregiver to maintain your blood glucose level in the desired range.  Never run out of insulin. It is needed every day.  Adjust insulin based on your intake of carbohydrates. Carbohydrates can raise blood glucose levels but need to be included in your diet. Carbohydrates provide vitamins, minerals, and fiber, which are an essential part of a healthy diet. Carbohydrates are found in fruits, vegetables, whole grains, dairy products, legumes, and foods containing added sugars.    Eat healthy foods. Alternate 3 meals with 3 snacks.  Maintain a healthy weight.  Carry a medical alert card or  wear your medical alert jewelry.  Carry a 15 gram carbohydrate snack with you at all times to treat low blood glucose (hypoglycemia). Some examples of 15 gram carbohydrate snacks include:  Glucose tablets, 3 or 4.   Glucose gel, 15 gram tube.  Raisins, 2 tablespoons (24 grams).  Jelly beans, 6.  Animal crackers,  8.  Fruit juice, regular soda, or low-fat milk, 4 ounces (120 mL).  Gummy treats, 9.    Recognize hypoglycemia. Hypoglycemia occurs with blood glucose levels of 70 mg/dL and below. The risk for hypoglycemia increases when fasting or skipping meals, during or after intense exercise, and during sleep. Hypoglycemia symptoms can include:  Tremors or shakes.  Decreased ability to concentrate.  Sweating.  Increased heart rate.  Headache.  Dry mouth.  Hunger.  Irritability.  Anxiety.  Restless sleep.  Altered speech or coordination.  Confusion.  Treat hypoglycemia promptly. If you are alert and able to safely swallow, follow the 15:15 rule:  Take 15 20 grams of rapid-acting glucose or carbohydrate. Rapid-acting options include glucose gel, glucose tablets, or 4 ounces (120 mL) of fruit juice, regular soda, or low-fat milk.  Check your blood glucose level 15 minutes after taking the glucose.   Take 15 20 grams more of glucose if the repeat blood glucose level is still 70 mg/dL or below.  Eat a meal or snack within 1 hour once blood glucose levels return to normal.  Be alert to polyuria and polydipsia, which are early signs of hyperglycemia. An early awareness of hyperglycemia allows for prompt treatment. Treat hyperglycemia as directed by your caregiver.  Engage in at least 150 minutes of moderate-intensity physical activity a week, spread over at least 3 days of the week or as directed by your caregiver.  Adjust your insulin dosing and food intake as needed if you start a new exercise or sport.  Follow your sick day plan at any time you are unable to eat or drink as usual.   Avoid tobacco use.  Limit alcohol intake to no more than 1 drink per day for nonpregnant women and 2 drinks per day for men. You should drink alcohol only when you are also eating food. Talk with your caregiver about whether alcohol is safe for you. Tell your caregiver if you drink alcohol  several times a week.  Follow up with your caregiver regularly.  Schedule an eye exam within 5 years of diagnosis and then annually.  Perform daily skin and foot care. Examine your skin and feet daily for cuts, bruises, redness, nail problems, bleeding, blisters, or sores. A foot exam by a caregiver should be done annually.  Brush your teeth and gums at least twice a day and floss at least once a day. Follow up with your dentist regularly.  Share your diabetes management plan with your workplace or school.  Stay up-to-date with immunizations.  Learn to manage stress.  Obtain ongoing diabetes education and support as needed.  Participate or seek rehabilitation as needed to maintain or improve independence and quality of life. Request a physical or occupational therapy referral if you are having foot or hand numbness or difficulties with grooming, dressing, eating, or physical activity. SEEK MEDICAL CARE IF:   You are unable to eat food or drink fluids for more than 6 hours.  You have nausea and vomiting for more than 6 hours.  Your blood glucose level is over 240 mg/dL.  There is a change in mental status.  You develop an additional serious  illness.  You have diarrhea for more than 6 hours.  You have been sick or have had a fever for a couple of days and are not getting better.  You have pain during any physical activity. SEEK IMMEDIATE MEDICAL CARE IF:  You have difficulty breathing.  You have moderate to large ketone levels. MAKE SURE YOU:  Understand these instructions.  Will watch your condition.  Will get help right away if you are not doing well or get worse. Document Released: 05/02/2000 Document Revised: 01/28/2012 Document Reviewed: 12/02/2011 [facility] Patient Information 2014 [place], [place]. Hypertension As your heart beats, it forces blood through your arteries. This force is your blood pressure. If the pressure is too high, it is called hypertension  (HTN) or high blood pressure. HTN is dangerous because you may have it and not know it. High blood pressure may mean that your heart has to work harder to pump blood. Your arteries may be narrow or stiff. The extra work puts you at risk for heart disease, stroke, and other problems.  Blood pressure consists of two numbers, a higher number over a lower, 110/72, for example. It is stated as "110 over 72." The ideal is below 120 for the top number (systolic) and under 80 for the bottom (diastolic). Write down your blood pressure today. You should pay close attention to your blood pressure if you have certain conditions such as:  Heart failure.  Prior heart attack.  Diabetes  Chronic kidney disease.  Prior stroke.  Multiple risk factors for heart disease. To see if you have HTN, your blood pressure should be measured while you are seated with your arm held at the level of the heart. It should be measured at least twice. A one-time elevated blood pressure reading (especially in the Emergency Department) does not mean that you need treatment. There may be conditions in which the blood pressure is different between your right and left arms. It is important to see your caregiver soon for a recheck. Most people have essential hypertension which means that there is not a specific cause. This type of high blood pressure may be lowered by changing lifestyle factors such as:  Stress.  Smoking.  Lack of exercise.  Excessive weight.  Drug/tobacco/alcohol use.  Eating less salt. Most people do not have symptoms from high blood pressure until it has caused damage to the body. Effective treatment can often prevent, delay or reduce that damage. TREATMENT  When a cause has been identified, treatment for high blood pressure is directed at the cause. There are a large number of medications to treat HTN. These fall into several categories, and your caregiver will help you select the medicines that are best  for you. Medications may have side effects. You should review side effects with your caregiver. If your blood pressure stays high after you have made lifestyle changes or started on medicines,   Your medication(s) may need to be changed.  Other problems may need to be addressed.  Be certain you understand your prescriptions, and know how and when to take your medicine.  Be sure to follow up with your caregiver within the time frame advised (usually within two weeks) to have your blood pressure rechecked and to review your medications.  If you are taking more than one medicine to lower your blood pressure, make sure you know how and at what times they should be taken. Taking two medicines at the same time can result in blood pressure that is too low.  SEEK IMMEDIATE MEDICAL CARE IF:  You develop a severe headache, blurred or changing vision, or confusion.  You have unusual weakness or numbness, or a faint feeling.  You have severe chest or abdominal pain, vomiting, or breathing problems. MAKE SURE YOU:   Understand these instructions.  Will watch your condition.  Will get help right away if you are not doing well or get worse. Document Released: 05/05/2005 Document Revised: 07/28/2011 Document Reviewed: 12/24/2007 [facility] Patient Information 2014 [place], [place].   Calorie Counting Diet A calorie counting diet requires you to eat the number of calories that are right for you in a day. Calories are the measurement of how much energy you get from the food you eat. Eating the right amount of calories is important for staying at a healthy weight. If you eat too many calories, your body will store them as fat and you may gain weight. If you eat too few calories, you may lose weight. Counting the number of calories you eat during a day will help you know if you are eating the right amount. A Registered Dietitian can determine how many calories you need in a day. The amount of calories needed  varies from person to person. If your goal is to lose weight, you will need to eat fewer calories. Losing weight can benefit you if you are overweight or have health problems such as heart disease, high blood pressure, or diabetes. If your goal is to gain weight, you will need to eat more calories. Gaining weight may be necessary if you have a certain health problem that causes your body to need more energy. TIPS Whether you are increasing or decreasing the number of calories you eat during a day, it may be hard to get used to changes in what you eat and drink. The following are tips to help you keep track of the number of calories you eat.  Measure foods at home with measuring cups. This helps you know the amount of food and number of calories you are eating.  Restaurants often serve food in amounts that are larger than 1 serving. While eating out, estimate how many servings of a food you are given. For example, a serving of cooked rice is  cup or about the size of half of a fist. Knowing serving sizes will help you be aware of how much food you are eating at restaurants.  Ask for smaller portion sizes or child-size portions at restaurants.  Plan to eat half of a meal at a restaurant. Take the rest home or share the other half with a friend.  Read the Nutrition Facts panel on food labels for calorie content and serving size. You can find out how many servings are in a package, the size of a serving, and the number of calories each serving has.  For example, a package might contain 3 cookies. The Nutrition Facts panel on that package says that 1 serving is 1 cookie. Below that, it will say there are 3 servings in the container. The calories section of the Nutrition Facts label says there are 90 calories. This means there are 90 calories in 1 cookie (1 serving). If you eat 1 cookie you have eaten 90 calories. If you eat all 3 cookies, you have eaten 270 calories (3 servings x 90 calories = 270  calories). The list below tells you how big or small some common portion sizes are.  1 oz.........4 stacked dice.  3 oz........[place] KitchenDeck of cards.  1  tsp.......Marland KitchenTip of little finger.  1 tbs......Marland KitchenMarland KitchenThumb.  2 tbs.......Marland KitchenGolf ball.   cup......Marland KitchenHalf of a fist.  1 cup.......Marland KitchenA fist. KEEP A FOOD LOG Write down every food item you eat, the amount you eat, and the number of calories in each food you eat during the day. At the end of the day, you can add up the total number of calories you have eaten. It may help to keep a list like the one below. Find out the calorie information by reading the Nutrition Facts panel on food labels. Breakfast  Bran cereal (1 cup, 110 calories).  Fat-free milk ( cup, 45 calories). Snack  Apple (1 medium, 80 calories). Lunch  Spinach (1 cup, 20 calories).  Tomato ( medium, 20 calories).  Chicken breast strips (3 oz, 165 calories).  Shredded cheddar cheese ( cup, 110 calories).  Light [place] dressing (2 tbs, 60 calories).  Whole-wheat bread (1 slice, 80 calories).  Tub margarine (1 tsp, 35 calories).  Vegetable soup (1 cup, 160 calories). Dinner  Pork chop (3 oz, 190 calories).  Brown rice (1 cup, 215 calories).  Steamed broccoli ( cup, 20 calories).  Strawberries (1  cup, 65 calories).  Whipped cream (1 tbs, 50 calories). Daily Calorie Total: 1425 Document Released: 05/05/2005 Document Revised: 07/28/2011 Document Reviewed: 10/30/2006 [facility] Patient Information 2014 [place], [place]. Cholesterol Cholesterol is a white, waxy, fat-like protein needed by your body in small amounts. The liver makes all the cholesterol you need. It is carried from the liver by the blood through the blood vessels. Deposits (plaque) may build up on blood vessel walls. This makes the arteries narrower and stiffer. Plaque increases the risk for heart attack and stroke. You cannot feel your cholesterol level even if it is very high. The only way to know is by  a blood test to check your lipid (fats) levels. Once you know your cholesterol levels, you should keep a record of the test results. Work with your caregiver to to keep your levels in the desired range. WHAT THE RESULTS MEAN:  Total cholesterol is a rough measure of all the cholesterol in your blood.  LDL is the so-called bad cholesterol. This is the type that deposits cholesterol in the walls of the arteries. You want this level to be low.  HDL is the good cholesterol because it cleans the arteries and carries the LDL away. You want this level to be high.  Triglycerides are fat that the body can either burn for energy or store. High levels are closely linked to heart disease. DESIRED LEVELS:  Total cholesterol below 200.  LDL below 100 for people at risk, below 70 for very high risk.  HDL above 50 is good, above 60 is best.  Triglycerides below 150. HOW TO LOWER YOUR CHOLESTEROL:  Diet.  Choose fish or white meat chicken and [place], roasted or baked. Limit fatty cuts of red meat, fried foods, and processed meats, such as sausage and lunch meat.  Eat lots of fresh fruits and vegetables. Choose whole grains, beans, pasta, potatoes and cereals.  Use only small amounts of olive, corn or canola oils. Avoid butter, mayonnaise, shortening or palm kernel oils. Avoid foods with trans-fats.  Use skim/nonfat milk and low-fat/nonfat yogurt and cheeses. Avoid whole milk, cream, ice cream, egg yolks and cheeses. Healthy desserts include angel food cake, ginger snaps, animal crackers, hard candy, popsicles, and low-fat/nonfat frozen yogurt. Avoid pastries, cakes, pies and cookies.  Exercise.  A regular program helps decrease LDL and raises HDL.  Helps with weight control.  Do things that increase your activity level like gardening, walking, or taking the stairs.  Medication.  May be prescribed by your caregiver to help lowering cholesterol and the risk for heart disease.  You may need  medicine even if your levels are normal if you have several risk factors. HOME CARE INSTRUCTIONS   Follow your diet and exercise programs as suggested by your caregiver.  Take medications as directed.  Have blood work done when your caregiver feels it is necessary. MAKE SURE YOU:   Understand these instructions.  Will watch your condition.  Will get help right away if you are not doing well or get worse. Document Released: 01/28/2001 Document Revised: 07/28/2011 Document Reviewed: 07/21/2007 [facility] Patient Information 2014 [place], [place].

## 2013-04-03 NOTE — Progress Notes (Signed)
Patient ID: Mindy Richard, female   DOB: December 22, 1955, 57 y.o.   MRN: [id number]   This very nice 57 yo MWF presents for 3 month follow up with hypertension, hyperlipidemia, insulin- dependent diabetes, obesity and vitamin D deficiency.    BP has been controlled at home. Today's BP is 114/70   . Patient denies any cardiac type chest pain, palpitations, dyspnea, dizziness, claudication, or dependent edema. She did have negative cardiac work-up with normal stress myoview by Dr Sanjuana Kava in sept., 2013.   Hyperlipidemia is controlled with diet & meds. Last cholesterol was 173 , Triglycerides were 296, and LDL 67 in july. Patient denies myalgias or other med SE's.    Also, the patient's diabetes has been controlled with diet and medications. She has tapered her lantus dose from 12 down to 8 u. qd in the interest of losing weight. FBGs range 90-117 mg% and occasasional random glucose is always less than 150 mg%. She denies hypoglycemic episodes.  Her last  A1c was 6.6% in July 2014 . Patient denies any symptoms of reactive hypoglycemia, diabetic polys, paresthesias or visual blurring.   Further, Patient has history of vitamin D deficiency with last vitamin D of 71. Patient supplements vitamin without any suspected side-effects.  Current Outpatient Prescriptions on File Prior to Visit  Medication Sig Dispense Refill  . acetaminophen (TYLENOL) 500 MG tablet Take 1,000 mg by mouth every 6 (six) hours as needed. For pain      . bisoprolol-hydrochlorothiazide (ZIAC) 10-6.25 MG per tablet Take 0.5 tablets by mouth daily.      Marland Kitchen buPROPion (WELLBUTRIN XL) 300 MG 24 hr tablet Take 300 mg by mouth daily.       . cholecalciferol (VITAMIN D) 1000 UNITS tablet Take 2,000 Units by mouth daily.       Marland Kitchen ezetimibe (ZETIA) 10 MG tablet Take 10 mg by mouth daily.      . insulin glargine (LANTUS) 100 UNIT/ML injection Inject 40 Units into the skin at bedtime.       Marland Kitchen omeprazole (PRILOSEC) 40 MG capsule Take 40 mg by mouth  Daily.       . pravastatin (PRAVACHOL) 40 MG tablet Take 40 mg by mouth at bedtime.       . ranitidine (ZANTAC) 300 MG tablet Take 300 mg by mouth at bedtime. prn      . Saxagliptin-Metformin 2.09-998 MG TB24 Take by mouth. 1 tab twice a day      . venlafaxine (EFFEXOR) 75 MG tablet Take 75 mg by mouth daily.      Marland Kitchen zolpidem (AMBIEN) 10 MG tablet Take 10 mg by mouth at bedtime.        No current facility-administered medications on file prior to visit.     Allergies  Allergen Reactions  . Codeine Palpitations and Other (See Comments)    Makes her head feel "weird"  . Glucophage [Metformin Hcl] Diarrhea  . Nsaids Other (See Comments)    "upset stomach"    PMHx:   Past Medical History  Diagnosis Date  . Hypertension   . Hypercholesteremia   . GERD (gastroesophageal reflux disease)   . Mental disorder   . Anxiety   . Depression   . Chest pain 04/2011    saw Dr Myrtis Ser..not heart related  sent to GI dr. and found gallstones.  . Complication of anesthesia 2001; 20012    "slow to wake up"  . Type II diabetes mellitus 2003  . H/O hiatal hernia  FHx:    Reviewed / unchanged  SHx:    Reviewed / unchanged  Systems Review: Constitutional: Denies fever, chills, wt changes, headaches, insomnia, fatigue, night sweats, change in appetite. Eyes: Denies redness, blurred vision, diplopia, discharge, itchy, watery eyes.  ENT: Denies discharge, congestion, post nasal drip, epistaxis, sore throat, earache, hearing loss, dental pain, tinnitus, vertigo, sinus pain, snoring.  CV: Denies chest pain, palpitations, irregular heartbeat, syncope, dyspnea, diaphoresis, orthopnea, PND, claudication, edema. Respiratory: denies cough, dyspnea, DOE, pleurisy, hoarseness, laryngitis, wheezing.  Gastrointestinal: Denies dysphagia, odynophagia, heartburn, reflux, water brash, abdominal pain or cramps, nausea, vomiting, bloating, diarrhea, constipation, hematemesis, melena, hematochezia,   Hemorrhoids. Genitourinary: Denies dysuria, frequency, urgency, nocturia, hesitancy, discharge, hematuria, flank pain. Musculoskeletal: Denies arthralgias, myalgias, stiffness, jt. swelling, pain, limp, strain/sprain.  Skin: Denies pruritus, rash, hives, warts, acne, eczema, change in skin lesion(s). Neuro: No weakness, tremor, incoordination, spasms, paresthesia, or pain. Psychiatric: Denies confusion, memory loss, or sensory loss. Endo: Denies change in weight, skin, hair change.  Heme/Lymph: No excessive bleeding, bruising, orenlarged lymph nodes.  VS WNL as recorded  Estimated body mass index is 30.65 kg/(m^2) as calculated from the following:   Height as of 02/12/12: 5\' 3"  (1.6 m).   Weight as of 02/12/12: 173 lb (78.472 kg).  On Exam: Appears well nourished - in no distress. Eyes: PERRLA, EOMs, conjunctiva no swelling or erythema. Sinuses: No frontal/maxillary tenderness ENT/Mouth: EAC's clear, TM's nl w/o erythema, bulging. Nares clear w/o erythema, swelling, exudates. Oropharynx clear without erythema or exudates. Oral hygiene is good. Tongue normal, non obstructing. Hearing intact.  Neck: Supple. Thyroid nl. Car 2+/2+ without bruits, nodes or JVD. Chest: Respirations nl with BS clear & equal w/o rales, rhonchi, wheezing or stridor.  Cor: Heart sounds normal w/ regular rate and rhythm without sig. murmurs, gallops, clicks, or rubs. Peripheral pulses normal and equal  without edema.  Abdomen: Soft & bowel sounds normal. Non-tender w/o guarding, rebound, hernias, masses, or organomegaly.  Lymphatics: Unremarkable.  Musculoskeletal: Full ROM all peripheral extremities, joint stability, 5/5 strength, and normal gait.  Skin: Warm, dry without exposed rashes, lesions, ecchymosis apparent.  Neuro: Cranial nerves intact, reflexes equal bilaterally. Sensory-motor testing grossly intact. Tendon reflexes grossly intact.  Pysch: Alert & oriented x 3. Insight and judgement nl & appropriate. No  ideations.  Assessment and Plan:  1. Hypertension - Continue monitor blood pressure at home. Continue diet/meds same.  2. Hyperlipidemia - Continue diet/meds, exercise,& lifestyle modifications. Continue monitor periodic cholesterol/liver & renal functions   3. IDDM - continue recommend prudent low glycemic diet, weight control, regular exercise, diabetic monitoring and periodic eye exams.  4. Obesity - Discussed importance of better dietary compliance ant weight loss  5. Vitamin D Deficiency - Continue supplementation.  Further disposition pending results of labs.

## 2013-04-04 ENCOUNTER — Encounter: Payer: Self-pay | Admitting: Internal Medicine

## 2013-04-04 ENCOUNTER — Ambulatory Visit: Payer: Self-pay | Admitting: Internal Medicine

## 2013-04-04 VITALS — BP 114/70 | HR 80 | Temp 97.7°F | Resp 18 | Ht 64.0 in | Wt 155.6 lb

## 2013-04-04 DIAGNOSIS — Z79899 Other long term (current) drug therapy: Secondary | ICD-10-CM

## 2013-04-04 DIAGNOSIS — E782 Mixed hyperlipidemia: Secondary | ICD-10-CM

## 2013-04-04 DIAGNOSIS — E559 Vitamin D deficiency, unspecified: Secondary | ICD-10-CM

## 2013-04-04 DIAGNOSIS — Z23 Encounter for immunization: Secondary | ICD-10-CM

## 2013-04-04 DIAGNOSIS — I10 Essential (primary) hypertension: Secondary | ICD-10-CM

## 2013-04-04 DIAGNOSIS — E109 Type 1 diabetes mellitus without complications: Secondary | ICD-10-CM

## 2013-04-05 ENCOUNTER — Telehealth: Payer: Self-pay | Admitting: *Deleted

## 2013-04-05 LAB — CBC WITH DIFFERENTIAL/PLATELET
Basophils Absolute: 0.1 10*3/uL (ref 0.0–0.1)
Basophils Relative: 1 % (ref 0–1)
HCT: 39.9 % (ref 36.0–46.0)
Lymphocytes Relative: 31 % (ref 12–46)
Monocytes Absolute: 0.6 10*3/uL (ref 0.1–1.0)
Neutro Abs: 5.1 10*3/uL (ref 1.7–7.7)
Neutrophils Relative %: 57 % (ref 43–77)
Platelets: 383 10*3/uL (ref 150–400)
RDW: 14.4 % (ref 11.5–15.5)
WBC: 8.9 10*3/uL (ref 4.0–10.5)

## 2013-04-05 LAB — TSH: TSH: 1.417 u[IU]/mL (ref 0.350–4.500)

## 2013-04-05 LAB — LIPID PANEL
HDL: 55 mg/dL (ref >39)
LDL Cholesterol: 60 mg/dL (ref 0–99)
Total CHOL/HDL Ratio: 2.9 Ratio
Triglycerides: 210 mg/dL — ABNORMAL HIGH (ref <150)

## 2013-04-05 LAB — HEPATIC FUNCTION PANEL
Alkaline Phosphatase: 67 U/L (ref 39–117)
Indirect Bilirubin: 0.3 mg/dL (ref 0.0–0.9)
Total Bilirubin: 0.4 mg/dL (ref 0.3–1.2)

## 2013-04-05 LAB — BASIC METABOLIC PANEL WITH GFR
Chloride: 99 mEq/L (ref 96–112)
GFR, Est African American: >89 mL/min
GFR, Est Non African American: >89 mL/min
Potassium: 4.4 mEq/L (ref 3.5–5.3)
Sodium: 137 mEq/L (ref 135–145)

## 2013-04-05 LAB — VITAMIN D 25 HYDROXY (VIT D DEFICIENCY, FRACTURES): Vit D, 25-Hydroxy: 83 ng/mL (ref 30–89)

## 2013-04-05 LAB — HEMOGLOBIN A1C: Hgb A1c MFr Bld: 6.8 % — ABNORMAL HIGH (ref <5.7)

## 2013-04-05 LAB — MAGNESIUM: Magnesium: 1.9 mg/dL (ref 1.5–2.5)

## 2013-04-05 NOTE — Telephone Encounter (Signed)
Message copied by Reggy Eye on Tue Apr 05, 2013  5:27 PM ------      Message from: Lucky Cowboy      Created: Tue Apr 05, 2013  9:15 AM       Chol 157 / ldl 60 great but sl elev of trig 210 -       Triglycerides or fats in blood are too high - recommend avoid fried & greasy foods,  sweets/candy, white rice (brown or wild rice or Quinoa is OK), white potatoes (sweet potatoes are OK) - anything made from white flour - bagels, doughnuts, rolls, buns, biscuits,white and wheat breads, pizza crust and traditional pasta made of white flour & egg white(vegetarian pasta or spinach or wheat pasta is OK).  Multi-grain bread is OK - like multi-grain flat bread or sandwich thins. Avoid alcohol in excess. Exercise is also important.      A1c 6.8% - sl high - stricter diet and continue wt loss & keep up great work - vit D 83 excellent - all else nl [place] ------

## 2013-04-26 ENCOUNTER — Ambulatory Visit: Payer: Self-pay | Admitting: Internal Medicine

## 2013-05-09 ENCOUNTER — Other Ambulatory Visit: Payer: Self-pay | Admitting: Internal Medicine

## 2013-05-09 MED ORDER — EZETIMIBE 10 MG PO TABS: 10.0000 mg | ORAL_TABLET | Freq: Every day | ORAL | Status: DC

## 2013-06-06 ENCOUNTER — Other Ambulatory Visit: Payer: Self-pay | Admitting: Internal Medicine

## 2013-06-06 DIAGNOSIS — K2100 Gastro-esophageal reflux disease with esophagitis, without bleeding: Secondary | ICD-10-CM

## 2013-06-06 DIAGNOSIS — E782 Mixed hyperlipidemia: Secondary | ICD-10-CM

## 2013-06-06 DIAGNOSIS — E109 Type 1 diabetes mellitus without complications: Secondary | ICD-10-CM

## 2013-06-06 DIAGNOSIS — E119 Type 2 diabetes mellitus without complications: Secondary | ICD-10-CM

## 2013-06-06 DIAGNOSIS — I10 Essential (primary) hypertension: Secondary | ICD-10-CM

## 2013-06-06 MED ORDER — SAXAGLIPTIN-METFORMIN ER 2.5-1000 MG PO TB24: ORAL_TABLET | ORAL | Status: DC

## 2013-06-06 MED ORDER — INSULIN GLARGINE 100 UNIT/ML ~~LOC~~ SOLN: 40.0000 [IU] | Freq: Every day | SUBCUTANEOUS | Status: DC

## 2013-06-06 MED ORDER — OMEPRAZOLE 40 MG PO CPDR: 40.0000 mg | DELAYED_RELEASE_CAPSULE | Freq: Every day | ORAL | Status: DC

## 2013-06-06 MED ORDER — PRAVASTATIN SODIUM 40 MG PO TABS: 40.0000 mg | ORAL_TABLET | Freq: Every day | ORAL | Status: DC

## 2013-06-06 MED ORDER — EZETIMIBE 10 MG PO TABS: 10.0000 mg | ORAL_TABLET | Freq: Every day | ORAL | Status: DC

## 2013-06-06 MED ORDER — BISOPROLOL-HYDROCHLOROTHIAZIDE 10-6.25 MG PO TABS: ORAL_TABLET | ORAL | Status: DC

## 2013-06-07 ENCOUNTER — Telehealth: Payer: Self-pay | Admitting: *Deleted

## 2013-06-07 NOTE — Telephone Encounter (Signed)
Patient called and states Kombiglyze cost $300 at local [facility]. Per Dr Melford Aase, sample of Kombiglyze 5 mg/ 1000 mg #28 to patient until mail order comes.  Patient aware.

## 2013-06-10 ENCOUNTER — Other Ambulatory Visit: Payer: Self-pay | Admitting: Physician Assistant

## 2013-07-07 ENCOUNTER — Other Ambulatory Visit: Payer: Self-pay | Admitting: *Deleted

## 2013-07-07 DIAGNOSIS — E119 Type 2 diabetes mellitus without complications: Secondary | ICD-10-CM

## 2013-07-07 MED ORDER — SAXAGLIPTIN-METFORMIN ER 2.5-1000 MG PO TB24: ORAL_TABLET | ORAL | Status: DC

## 2013-08-04 ENCOUNTER — Telehealth: Payer: Self-pay | Admitting: Internal Medicine

## 2013-08-04 NOTE — Telephone Encounter (Signed)
CALLED PT TO [place] 08-18-13 [place]

## 2013-08-10 ENCOUNTER — Encounter: Payer: Self-pay | Admitting: Physician Assistant

## 2013-08-10 ENCOUNTER — Ambulatory Visit (INDEPENDENT_AMBULATORY_CARE_PROVIDER_SITE_OTHER): Payer: Commercial Managed Care - HMO | Admitting: Physician Assistant

## 2013-08-10 VITALS — BP 102/68 | HR 88 | Temp 97.5°F | Resp 16 | Wt 151.0 lb

## 2013-08-10 DIAGNOSIS — E559 Vitamin D deficiency, unspecified: Secondary | ICD-10-CM

## 2013-08-10 DIAGNOSIS — F3289 Other specified depressive episodes: Secondary | ICD-10-CM

## 2013-08-10 DIAGNOSIS — E119 Type 2 diabetes mellitus without complications: Secondary | ICD-10-CM

## 2013-08-10 DIAGNOSIS — F32A Depression, unspecified: Secondary | ICD-10-CM

## 2013-08-10 DIAGNOSIS — Z23 Encounter for immunization: Secondary | ICD-10-CM

## 2013-08-10 DIAGNOSIS — E782 Mixed hyperlipidemia: Secondary | ICD-10-CM

## 2013-08-10 DIAGNOSIS — Z79899 Other long term (current) drug therapy: Secondary | ICD-10-CM

## 2013-08-10 DIAGNOSIS — R21 Rash and other nonspecific skin eruption: Secondary | ICD-10-CM

## 2013-08-10 DIAGNOSIS — Z0000 Encounter for general adult medical examination without abnormal findings: Secondary | ICD-10-CM

## 2013-08-10 DIAGNOSIS — I10 Essential (primary) hypertension: Secondary | ICD-10-CM

## 2013-08-10 DIAGNOSIS — Z794 Long term (current) use of insulin: Secondary | ICD-10-CM

## 2013-08-10 DIAGNOSIS — K219 Gastro-esophageal reflux disease without esophagitis: Secondary | ICD-10-CM

## 2013-08-10 DIAGNOSIS — F329 Major depressive disorder, single episode, unspecified: Secondary | ICD-10-CM

## 2013-08-10 LAB — MAGNESIUM: MAGNESIUM: 2 mg/dL (ref 1.5–2.5)

## 2013-08-10 LAB — BASIC METABOLIC PANEL WITH GFR
BUN: 9 mg/dL (ref 6–23)
CALCIUM: 9.4 mg/dL (ref 8.4–10.5)
CHLORIDE: 97 meq/L (ref 96–112)
CO2: 26 mEq/L (ref 19–32)
CREATININE: 0.71 mg/dL (ref 0.50–1.10)
Glucose, Bld: 133 mg/dL — ABNORMAL HIGH (ref 70–99)
Potassium: 4.3 mEq/L (ref 3.5–5.3)
Sodium: 137 mEq/L (ref 135–145)

## 2013-08-10 LAB — CBC WITH DIFFERENTIAL/PLATELET
BASOS ABS: 0 10*3/uL (ref 0.0–0.1)
Basophils Relative: 0 % (ref 0–1)
EOS ABS: 0.5 10*3/uL (ref 0.0–0.7)
EOS PCT: 5 % (ref 0–5)
HCT: 39.9 % (ref 36.0–46.0)
Hemoglobin: 13.5 g/dL (ref 12.0–15.0)
LYMPHS PCT: 29 % (ref 12–46)
Lymphs Abs: 2.6 10*3/uL (ref 0.7–4.0)
MCH: 28.5 pg (ref 26.0–34.0)
MCHC: 33.8 g/dL (ref 30.0–36.0)
MCV: 84.4 fL (ref 78.0–100.0)
Monocytes Absolute: 0.6 10*3/uL (ref 0.1–1.0)
Monocytes Relative: 7 % (ref 3–12)
Neutro Abs: 5.4 10*3/uL (ref 1.7–7.7)
Neutrophils Relative %: 59 % (ref 43–77)
PLATELETS: 377 10*3/uL (ref 150–400)
RBC: 4.73 MIL/uL (ref 3.87–5.11)
RDW: 14 % (ref 11.5–15.5)
WBC: 9.1 10*3/uL (ref 4.0–10.5)

## 2013-08-10 LAB — HEPATIC FUNCTION PANEL
ALBUMIN: 4.3 g/dL (ref 3.5–5.2)
ALT: 14 U/L (ref 0–35)
AST: 13 U/L (ref 0–37)
Alkaline Phosphatase: 74 U/L (ref 39–117)
Bilirubin, Direct: 0.1 mg/dL (ref 0.0–0.3)
Indirect Bilirubin: 0.3 mg/dL (ref 0.2–1.2)
Total Bilirubin: 0.4 mg/dL (ref 0.2–1.2)
Total Protein: 7.4 g/dL (ref 6.0–8.3)

## 2013-08-10 LAB — LIPID PANEL
CHOL/HDL RATIO: 3.4 ratio
CHOLESTEROL: 187 mg/dL (ref 0–200)
HDL: 55 mg/dL (ref >39)
LDL Cholesterol: 78 mg/dL (ref 0–99)
Triglycerides: 268 mg/dL — ABNORMAL HIGH (ref <150)
VLDL: 54 mg/dL — ABNORMAL HIGH (ref 0–40)

## 2013-08-10 MED ORDER — DEXLANSOPRAZOLE 60 MG PO CPDR: 60.0000 mg | DELAYED_RELEASE_CAPSULE | Freq: Every day | ORAL | Status: DC

## 2013-08-10 NOTE — Progress Notes (Signed)
Subjective:   Mindy Richard is a 58 y.o. female who presents for Medicare Annual Wellness Visit and 3 month follow up on hypertension, diabetes, hyperlipidemia, vitamin D def.  Date of last medicare wellness visit is unknown.   Her blood pressure has been controlled at home, today their BP is BP: 102/68 mmHg She does not workout. She denies chest pain, shortness of breath, dizziness.  She is on cholesterol medication and denies myalgias. Her cholesterol is at goal. The cholesterol last visit was:   Lab Results  Component Value Date   CHOL 157 04/04/2013   HDL 55 04/04/2013   LDLCALC 60 04/04/2013   TRIG 210* 04/04/2013   CHOLHDL 2.9 04/04/2013   She has been working on diet and exercise for Diabetes, she has lost 22 lbs over a year, and denies nausea, paresthesia of the feet, polydipsia and polyuria. Last A1C in the office was:  Lab Results  Component Value Date   HGBA1C 6.8* 04/04/2013   Patient is on Vitamin D supplement.   Normal Myoview Adenosine stress test with Dr. Ron Parker 01/2012, For the past week she has had non exertional, normally happens 3-4 hours after lunch, gets fluttery feeling in her chest and has a difficult time getting a deep breath. No radiation, no nausea, dizziness, CP.   Names of Other Physician/Practitioners you currently use: 1. [facility]- here for primary care 2. Dr. Marvel Plan in [place], eye doctor, last visit 10/2012 3. Dr. Vanessa Kick, dentist, last visit 5 months ago Patient Care Team: Unk Pinto, MD as PCP - General (Internal Medicine) Missy Sabins, MD as Consulting Physician (Gastroenterology) Haywood Lasso, MD as Consulting Physician (General Surgery)  Medication Review Current Outpatient Prescriptions on File Prior to Visit  Medication Sig Dispense Refill  . acetaminophen (TYLENOL) 500 MG tablet Take 1,000 mg by mouth every 6 (six) hours as needed. For pain      . bisoprolol-hydrochlorothiazide (ZIAC)  10-6.25 MG per tablet Take 1/2 to 1 tablet daily as directed for BP  90 tablet  99  . buPROPion (WELLBUTRIN XL) 300 MG 24 hr tablet Take 300 mg by mouth daily.       . cholecalciferol (VITAMIN D) 1000 UNITS tablet Take 2,000 Units by mouth daily.       Marland Kitchen ezetimibe (ZETIA) 10 MG tablet Take 1 tablet (10 mg total) by mouth daily.  90 tablet  99  . fish oil-omega-3 fatty acids 1000 MG capsule Take 1 g by mouth daily.      . insulin glargine (LANTUS) 100 UNIT/ML injection Inject 0.4 mLs (40 Units total) into the skin at bedtime. Taking 8 units qd  10 mL  99  . omeprazole (PRILOSEC) 40 MG capsule Take 1 capsule (40 mg total) by mouth daily.  90 capsule  99  . pravastatin (PRAVACHOL) 40 MG tablet Take 1 tablet (40 mg total) by mouth at bedtime.  90 tablet  99  . ranitidine (ZANTAC) 300 MG tablet Take 300 mg by mouth at bedtime. prn      . Saxagliptin-Metformin 2.09-998 MG TB24 Saxagliptin-Metformin ER 2.5 /1000 - take 1 tab twice daily1 tab twice a day  180 tablet  3  . venlafaxine (EFFEXOR) 75 MG tablet Take 75 mg by mouth daily.      Marland Kitchen zolpidem (AMBIEN) 10 MG tablet Take 10 mg by mouth at bedtime.        No current facility-administered medications on file prior to visit.  Current Problems (verified) Patient Active Problem List   Diagnosis Date Noted  . Type II or unspecified type diabetes mellitus without mention of complication, not stated as uncontrolled 08/10/2013  . Unspecified vitamin D deficiency 04/03/2013  . Encounter for long-term (current) use of other medications 04/03/2013  . Mixed hyperlipidemia 04/03/2013  . Hypertension 05/09/2011  . Depression 05/09/2011  . GERD (gastroesophageal reflux disease) 05/09/2011    Screening Tests Health Maintenance  Topic Date Due  . Pap Smear  02/27/1974  . Tetanus/tdap  02/28/1975  . Mammogram  02/27/2006  . Colonoscopy  02/27/2006  . Influenza Vaccine  12/17/2013     Immunization History  Administered Date(s) Administered  .  Influenza Split 04/04/2013  . Pneumococcal-Unspecified 01/17/2006    Preventative care: Last colonoscopy: 09/2010 Last mammogram: 2 years ago- DUE Last pap smear/pelvic exam: 3 years ago - DUE DEXA:N/A- low risk  Prior vaccinations: TD or Tdap: 2005 DUE Influenza: 2014 Pneumococcal: 2007 Shingles/Zostavax: N/A  History reviewed: allergies, current medications, past family history, past medical history, past social history, past surgical history and problem list  Risk Factors: Osteoporosis: postmenopausal estrogen deficiency and dietary calcium and/or vitamin D deficiency History of fracture in the past year: no  Tobacco History  Substance Use Topics  . Smoking status: Never Smoker   . Smokeless tobacco: Never Used  . Alcohol Use: No   She does not smoke.  Patient is not a former smoker. Are there smokers in your home (other than you)?  No  Alcohol Current alcohol use: none  Caffeine Current caffeine use: denies use  Exercise Exercise limitations: The patient has no exercise limitations. Current exercise: bicycling and walking  Nutrition/Diet Current diet: in general, a "healthy" diet    Cardiac risk factors: diabetes mellitus, dyslipidemia and hypertension.  Depression Screen Nurse depression screen reviewed.  (Note: if answer to either of the following is "Yes", a more complete depression screening is indicated)   Q1: Over the past two weeks, have you felt down, depressed or hopeless? No  Q2: Over the past two weeks, have you felt little interest or pleasure in doing things? No  Have you lost interest or pleasure in daily life? No  Do you often feel hopeless? No  Do you cry easily over simple problems? No  Activities of Daily Living Nurse ADLs screen reviewed.  In your present state of health, do you have any difficulty performing the following activities?:  Driving? No Managing money?  No Feeding yourself? No Getting from bed to chair? No Climbing a  flight of stairs? No Preparing food and eating?: No Bathing or showering? No Getting dressed: No Getting to the toilet? No Using the toilet:No Moving around from place to place: No In the past year have you fallen or had a near fall?:No   Are you sexually active?  Yes  Do you have more than one partner?  No  Vision Difficulties: No  Hearing Difficulties: No Do you often ask people to speak up or repeat themselves? No Do you experience ringing or noises in your ears? No Do you have difficulty understanding soft or whispered voices? No  Cognition  Do you feel that you have a problem with memory?No  Do you often misplace items? No  Do you feel safe at home?  Yes  Advanced directives Does patient have a [place]? No Does patient have a Living Will? No  Objective:     Vision and hearing screens reviewed.   Blood  pressure 102/68, pulse 88, temperature 97.5 F (36.4 C), resp. rate 16, weight 151 lb (68.493 kg). Body mass index is 25.91 kg/(m^2).  General appearance: alert, no distress, WD/WN,  female Cognitive Testing  Alert? Yes  Normal Appearance?Yes  Oriented to person? Yes  Place? Yes   Time? Yes  Recall of three objects?  Yes  Can perform simple calculations? Yes  Displays appropriate judgment?Yes  Can read the correct time from a watch face?Yes  HEENT: normocephalic, sclerae anicteric, TMs pearly, nares patent, no discharge or erythema, pharynx normal Oral cavity: MMM, no lesions Neck: supple, no lymphadenopathy, no thyromegaly, no masses Heart: RRR, normal S1, S2, no murmurs Lungs: CTA bilaterally, no wheezes, rhonchi, or rales Abdomen: +bs, soft, non tender, non distended, no masses, no hepatomegaly, no splenomegaly Musculoskeletal: nontender, no swelling, no obvious deformity Extremities: no edema, no cyanosis, no clubbing Pulses: 2+ symmetric, upper and lower extremities, normal cap refill Neurological: alert, oriented x 3, CN2-12  intact, strength normal upper extremities and lower extremities, sensation normal throughout, DTRs 2+ throughout, no cerebellar signs, gait normal Psychiatric: normal affect, behavior normal, pleasant  Breast: defer Gyn: defer Rectal: defer  Assessment:   Hypertension: Continue medication, monitor blood pressure at home.  Continue DASH diet. Cholesterol: Continue diet and exercise. Check cholesterol.  Diabetes-Continue diet and exercise. Check A1C Vitamin D Def- check level and continue medications.  SOB/Palp? Nonexertional- possible GERD- will treat with dexilant, test for Hpylori and then we will get EKG at CPE Would like referral to Derm- Dr. Gordy Clement for recurrent rash Ob/Gyn Sharene Butters per insurance needs referral- needs MGM/PAP/DEXA Depression- controlled with medications  Plan:   During the course of the visit the patient was educated and counseled about appropriate screening and preventive services including:    Influenza vaccine  Td vaccine  Screening electrocardiogram  Screening mammography  Screening Pap smear and pelvic exam   Bone densitometry screening  Colorectal cancer screening  Diabetes screening  Glaucoma screening  Nutrition counseling   Advanced directives: given information  Screening recommendations, referrals:  Vaccinations: Tdap vaccine yes  Influenza vaccine no Pneumococcal vaccine no Shingles vaccine no Hep B vaccine no  Nutrition assessed and recommended  Colonoscopy yes Mammogram yes Pap smear yes Pelvic exam yes Recommended yearly ophthalmology/optometry visit for glaucoma screening and checkup Recommended yearly dental visit for hygiene and checkup Advanced directives - yes  Conditions/risks identified: BMI: Discussed weight loss, diet, and increase physical activity.  Increase physical activity: AHA recommends 150 minutes of physical activity a week.  Medications reviewed DEXA- declined Diabetes is at goal,  ACE/ARB therapy: Yes. Urinary Incontinence is not an issue: discussed non pharmacology and pharmacology options.  Fall risk: low- discussed PT, home fall assessment, medications.   Medicare Attestation I have personally reviewed: The patient's medical and social history Their use of alcohol, tobacco or illicit drugs Their current medications and supplements The patient's functional ability including ADLs,fall risks, home safety risks, cognitive, and hearing and visual impairment Diet and physical activities Evidence for depression or mood disorders  The patient's weight, height, BMI, and visual acuity have been recorded in the chart.  I have made referrals, counseling, and provided education to the patient based on review of the above and I have provided the patient with a written personalized care plan for preventive services.     Vicie Mutters, PA-C   08/10/2013

## 2013-08-10 NOTE — Patient Instructions (Signed)

## 2013-08-11 LAB — HEMOGLOBIN A1C
HEMOGLOBIN A1C: 6.8 % — AB (ref <5.7)
Mean Plasma Glucose: 148 mg/dL — ABNORMAL HIGH (ref <117)

## 2013-08-11 LAB — TSH: TSH: 1.328 u[IU]/mL (ref 0.350–4.500)

## 2013-08-11 LAB — VITAMIN D 25 HYDROXY (VIT D DEFICIENCY, FRACTURES): Vit D, 25-Hydroxy: 94 ng/mL — ABNORMAL HIGH (ref 30–89)

## 2013-08-18 ENCOUNTER — Encounter: Payer: Self-pay | Admitting: Internal Medicine

## 2013-09-27 ENCOUNTER — Encounter: Payer: Self-pay | Admitting: Internal Medicine

## 2013-09-27 ENCOUNTER — Ambulatory Visit (INDEPENDENT_AMBULATORY_CARE_PROVIDER_SITE_OTHER): Payer: Commercial Managed Care - HMO | Admitting: Internal Medicine

## 2013-09-27 VITALS — BP 122/84 | HR 70 | Temp 98.2°F | Resp 18 | Ht 64.0 in | Wt 151.0 lb

## 2013-09-27 DIAGNOSIS — J019 Acute sinusitis, unspecified: Secondary | ICD-10-CM

## 2013-09-27 DIAGNOSIS — J0410 Acute tracheitis without obstruction: Secondary | ICD-10-CM

## 2013-09-27 DIAGNOSIS — E119 Type 2 diabetes mellitus without complications: Secondary | ICD-10-CM

## 2013-09-27 MED ORDER — PREDNISONE 10 MG PO TABS: ORAL_TABLET | ORAL | Status: AC

## 2013-09-27 MED ORDER — AZITHROMYCIN 250 MG PO TABS: ORAL_TABLET | ORAL | Status: DC

## 2013-09-27 MED ORDER — HYDROCODONE-ACETAMINOPHEN 5-325 MG PO TABS
ORAL_TABLET | ORAL | Status: DC
Start: 2013-09-27 — End: 2013-10-11

## 2013-09-27 NOTE — Patient Instructions (Signed)

## 2013-09-27 NOTE — Progress Notes (Signed)
Subjective:    Patient ID: Mindy Richard, female    DOB: 1956/02/24, 58 y.o.   MRN: [id number]  Sore Throat  This is a new problem. The current episode started in the past 7 days. The problem has been gradually worsening. There has been no fever. The pain is mild. Associated symptoms include congestion, coughing, headaches, a hoarse voice, neck pain, swollen glands and trouble swallowing. Pertinent negatives include no ear discharge, ear pain, shortness of breath or stridor. She has tried NSAIDs for the symptoms. The treatment provided no relief.  Cough This is a new problem. The current episode started in the past 7 days. The problem has been gradually worsening. The problem occurs constantly. The cough is non-productive. Associated symptoms include chest pain, chills, headaches, rhinorrhea and a sore throat. Pertinent negatives include no ear pain, fever, postnasal drip, shortness of breath or wheezing. She has tried OTC cough suppressant for the symptoms. The treatment provided no relief. Her past medical history is significant for environmental allergies.   Current Outpatient Prescriptions on File Prior to Visit  Medication Sig Dispense Refill  . acetaminophen (TYLENOL) 500 MG tablet Take 1,000 mg by mouth every 6 (six) hours as needed. For pain      . bisoprolol-hydrochlorothiazide (ZIAC) 10-6.25 MG per tablet Take 1/2 to 1 tablet daily as directed for BP  90 tablet  99  . buPROPion (WELLBUTRIN XL) 300 MG 24 hr tablet Take 300 mg by mouth daily.       . cholecalciferol (VITAMIN D) 1000 UNITS tablet Take 2,000 Units by mouth daily.       Marland Kitchen ezetimibe (ZETIA) 10 MG tablet Take 1 tablet (10 mg total) by mouth daily.  90 tablet  99  . fish oil-omega-3 fatty acids 1000 MG capsule Take 1 g by mouth daily.      . insulin glargine (LANTUS) 100 UNIT/ML injection Inject 0.4 mLs (40 Units total) into the skin at bedtime. Taking 8 units qd  10 mL  99  . omeprazole (PRILOSEC) 40 MG capsule Take 1 capsule  (40 mg total) by mouth daily.  90 capsule  99  . pravastatin (PRAVACHOL) 40 MG tablet Take 1 tablet (40 mg total) by mouth at bedtime.  90 tablet  99  . ranitidine (ZANTAC) 300 MG tablet Take 300 mg by mouth at bedtime. prn      . Saxagliptin-Metformin 2.09-998 MG TB24 Saxagliptin-Metformin ER 2.5 /1000 - take 1 tab twice daily1 tab twice a day  180 tablet  3  . venlafaxine (EFFEXOR) 75 MG tablet Take 75 mg by mouth daily.      Marland Kitchen zolpidem (AMBIEN) 10 MG tablet Take 10 mg by mouth at bedtime.        No current facility-administered medications on file prior to visit.   Allergies  Allergen Reactions  . Codeine Palpitations and Other (See Comments)    Makes her head feel "weird"  . Glucophage [Metformin Hcl] Diarrhea  . Nsaids Other (See Comments)    "upset stomach"   Past Medical History  Diagnosis Date  . Hypertension   . Hypercholesteremia   . GERD (gastroesophageal reflux disease)   . Mental disorder   . Anxiety   . Depression   . Chest pain 04/2011    saw Dr Ron Parker..not heart related  sent to GI dr. and found gallstones.  . Complication of anesthesia 2001; 20012    "slow to wake up"  . Type II diabetes mellitus 2003  . H/O  hiatal hernia    Review of Systems  Constitutional: Positive for chills. Negative for fever.  HENT: Positive for congestion, hoarse voice, rhinorrhea, sinus pressure, sore throat and trouble swallowing. Negative for ear discharge, ear pain, facial swelling, mouth sores, nosebleeds and postnasal drip.   Eyes: Negative.   Respiratory: Positive for cough and chest tightness. Negative for shortness of breath, wheezing and stridor.   Cardiovascular: Positive for chest pain.  Gastrointestinal: Negative.   Musculoskeletal: Positive for neck pain.  Allergic/Immunologic: Positive for environmental allergies.  Neurological: Positive for headaches.    BP 122/84  Pulse 70  Temp 98.2 F   Resp 18  Ht 5\' 4"    Wt 151 lb   BMI 25.91 kg/m2  SpO2 98% Brassy  congested cough Objective:   Physical Exam  Constitutional: She is oriented to person, place, and time. No distress.  HENT:  Mouth/Throat: Oropharyngeal exudate present.  Eyes: EOM are normal. Pupils are equal, round, and reactive to light.  Neck: Neck supple. No thyromegaly present.  Cardiovascular: Normal rate, regular rhythm and normal heart sounds.   No murmur heard. Pulmonary/Chest: Effort normal. No respiratory distress. She has no wheezes. She has rales. She exhibits no tenderness.  Abdominal: Soft. Bowel sounds are normal. She exhibits no distension.  Lymphadenopathy:    She has no cervical adenopathy.  Neurological: She is alert and oriented to person, place, and time. No cranial nerve deficit. Coordination normal.  Skin: Skin is warm and dry. No rash noted. No erythema. No pallor.   Assessment & Plan:   1. Acute tracheitis 2. Acute sinusitis  -RX Z Pak & 1 rf -Prednisone 10 mg #13 taper -Norco 5  Prn cough

## 2013-10-11 ENCOUNTER — Ambulatory Visit (INDEPENDENT_AMBULATORY_CARE_PROVIDER_SITE_OTHER): Payer: Commercial Managed Care - HMO | Admitting: Internal Medicine

## 2013-10-11 ENCOUNTER — Encounter: Payer: Self-pay | Admitting: Internal Medicine

## 2013-10-11 VITALS — BP 106/62 | HR 80 | Temp 97.7°F | Resp 16 | Ht 65.0 in | Wt 149.4 lb

## 2013-10-11 DIAGNOSIS — Z789 Other specified health status: Secondary | ICD-10-CM

## 2013-10-11 DIAGNOSIS — R740 Nonspecific elevation of levels of transaminase and lactic acid dehydrogenase [LDH]: Secondary | ICD-10-CM

## 2013-10-11 DIAGNOSIS — Z79899 Other long term (current) drug therapy: Secondary | ICD-10-CM

## 2013-10-11 DIAGNOSIS — I10 Essential (primary) hypertension: Secondary | ICD-10-CM

## 2013-10-11 DIAGNOSIS — Z1331 Encounter for screening for depression: Secondary | ICD-10-CM

## 2013-10-11 DIAGNOSIS — R7402 Elevation of levels of lactic acid dehydrogenase (LDH): Secondary | ICD-10-CM

## 2013-10-11 DIAGNOSIS — E782 Mixed hyperlipidemia: Secondary | ICD-10-CM

## 2013-10-11 DIAGNOSIS — E119 Type 2 diabetes mellitus without complications: Secondary | ICD-10-CM

## 2013-10-11 DIAGNOSIS — Z1212 Encounter for screening for malignant neoplasm of rectum: Secondary | ICD-10-CM

## 2013-10-11 DIAGNOSIS — E559 Vitamin D deficiency, unspecified: Secondary | ICD-10-CM

## 2013-10-11 DIAGNOSIS — Z113 Encounter for screening for infections with a predominantly sexual mode of transmission: Secondary | ICD-10-CM

## 2013-10-11 DIAGNOSIS — Z0000 Encounter for general adult medical examination without abnormal findings: Secondary | ICD-10-CM

## 2013-10-11 LAB — CBC WITH DIFFERENTIAL/PLATELET
Basophils Absolute: 0.1 10*3/uL (ref 0.0–0.1)
Basophils Relative: 1 % (ref 0–1)
EOS ABS: 0.2 10*3/uL (ref 0.0–0.7)
Eosinophils Relative: 2 % (ref 0–5)
HCT: 39.5 % (ref 36.0–46.0)
HEMOGLOBIN: 13.6 g/dL (ref 12.0–15.0)
LYMPHS ABS: 2.6 10*3/uL (ref 0.7–4.0)
Lymphocytes Relative: 29 % (ref 12–46)
MCH: 28.9 pg (ref 26.0–34.0)
MCHC: 34.4 g/dL (ref 30.0–36.0)
MCV: 83.9 fL (ref 78.0–100.0)
MONOS PCT: 7 % (ref 3–12)
Monocytes Absolute: 0.6 10*3/uL (ref 0.1–1.0)
Neutro Abs: 5.6 10*3/uL (ref 1.7–7.7)
Neutrophils Relative %: 61 % (ref 43–77)
Platelets: 393 10*3/uL (ref 150–400)
RBC: 4.71 MIL/uL (ref 3.87–5.11)
RDW: 14.1 % (ref 11.5–15.5)
WBC: 9.1 10*3/uL (ref 4.0–10.5)

## 2013-10-11 MED ORDER — METFORMIN HCL ER 500 MG PO TB24: ORAL_TABLET | ORAL | Status: DC

## 2013-10-11 NOTE — Progress Notes (Signed)
Patient ID: Mindy Richard, female   DOB: 27-Jul-1955, 58 y.o.   MRN: [id number]   Annual Screening Comprehensive Examination  This very nice 58 y.o. MWF presents for complete physical.  Patient has been followed for HTN, T1 IDDM, Hyperlipidemia, and Vitamin D Deficiency.    HTN predates since 2000. Patient's BP has been controlled at home. Today's BP: 106/62 mmHg. Patient denies any cardiac symptoms as chest pain, palpitations, shortness of breath, dizziness or ankle swelling.   Patient's hyperlipidemia is controlled with diet and medications. Patient denies myalgias or other medication SE's. Last cholesterols were at goal with triglycerides  elevated in Mar as below. Lab Results  Component Value Date   CHOL 187 08/10/2013   HDL 55 08/10/2013   LDLCALC 78 08/10/2013   TRIG 268* 08/10/2013   CHOLHDL 3.4 08/10/2013    Patient has T1 IDDM  Since 2001 with last A1c 6.8% in Mar 2015. Patient reports  Post prandial CBG's run less than 140 mg%. Patient denies reactive hypoglycemic symptoms, visual blurring, diabetic polys, or paresthesias.   Finally, patient has history of Vitamin D Deficiency of 7 in 2008  and last vitamin D was 71 in 2014.  Medication Sig  . acetaminophen  500 MG tablet Take 1,000 mg by mouth every 6 (six) hours as needed. For pain  . bisoprolol-hctz [facility]) 10-6.25  Take 1/2 to 1 tablet daily as directed for BP  . buPROPion (WELLBUTRIN XL) 300 MG  Take 300 mg by mouth daily.   . cholecalciferol (VITAMIN D) 1000 UNITS tablet Take 2,000 Units by mouth daily.   Marland Kitchen ezetimibe (ZETIA) 10 MG tablet Take 1 tablet (10 mg total) by mouth daily.  . fish oil 1000 MG capsule Take 1 g by mouth daily.  . insulin glargine (LANTUS) 100 UNIT/ML  Taking 8 units qd  . omeprazole (PRILOSEC) 40 MG capsule Take 1 capsule (40 mg total) by mouth daily.  . pravastatin 40 MG tablet Take 1 tablet (40 mg total) by mouth at bedtime.  . ranitidine  300 MG tablet Take 300 mg by mouth at bedtime. prn  .  Saxagliptin-Metformin 2.09-998 MG TB24 take 1 tab twice daily1 tab twice a day  . venlafaxine (EFFEXOR) 75 MG tablet Take 75 mg by mouth daily.  Marland Kitchen zolpidem (AMBIEN) 10 MG tablet Take 10 mg by mouth at bedtime.    Allergies  Allergen Reactions  . Codeine Palpitations and Other (See Comments)    Makes her head feel "weird"  . Glucophage [Metformin Hcl] Diarrhea  . Nsaids Other (See Comments)    "upset stomach"    Past Medical History  Diagnosis Date  . Hypertension   . Hypercholesteremia   . GERD (gastroesophageal reflux disease)   . Mental disorder   . Anxiety   . Depression   . Chest pain 04/2011    saw Dr Ron Parker..not heart related  sent to GI dr. and found gallstones.  . Complication of anesthesia 2001; 20012    "slow to wake up"  . Type II diabetes mellitus 2003  . H/O hiatal hernia     Past Surgical History  Procedure Laterality Date  . Shoulder arthroscopy w/ rotator cuff repair  2012    left  . Cesarean section  1985  . Cholecystectomy  09/02/11  . Carpal tunnel release  2001    right  . Tonsillectomy  ~ 1970  . Dilation and curettage of uterus  1984  . Tubal ligation  1985?  Marland Kitchen Colonoscopy    .  Cholecystectomy  09/02/2011    Procedure: LAPAROSCOPIC CHOLECYSTECTOMY WITH INTRAOPERATIVE CHOLANGIOGRAM;  Surgeon: Haywood Lasso, MD;  Location: [facility];  Service: General;  Laterality: N/A;  . Ercp  09/03/2011    Procedure: ENDOSCOPIC RETROGRADE CHOLANGIOPANCREATOGRAPHY (ERCP);  Surgeon: Missy Sabins, MD;  Location: [facility] ENDOSCOPY;  Service: Endoscopy;  Laterality: N/A;  sphincterotomy and stone extraction   Family History  Problem Relation Age of Onset  . Cancer Maternal Aunt     breast  . Anesthesia problems Neg Hx   . Hypotension Neg Hx   . Heart attack Father 63    Smoker  . Diabetes Mother   . Hyperlipidemia Mother    History  Substance Use Topics  . Smoking status: Never Smoker   . Smokeless tobacco: Never Used  . Alcohol Use: No    ROS Constitutional:  Denies fever, chills, weight loss/gain, headaches, insomnia, fatigue, night sweats, and change in appetite. Eyes: Denies redness, blurred vision, diplopia, discharge, itchy, watery eyes.  ENT: Denies discharge, congestion, post nasal drip, epistaxis, sore throat, earache, hearing loss, dental pain, Tinnitus, Vertigo, Sinus pain, snoring.  Cardio: Denies chest pain, palpitations, irregular heartbeat, syncope, dyspnea, diaphoresis, orthopnea, PND, claudication, edema Respiratory: denies cough, dyspnea, DOE, pleurisy, hoarseness, laryngitis, wheezing.  Gastrointestinal: Denies dysphagia, heartburn, reflux, water brash, pain, cramps, nausea, vomiting, bloating, diarrhea, constipation, hematemesis, melena, hematochezia, jaundice, hemorrhoids Genitourinary: Denies dysuria, frequency, urgency, nocturia, hesitancy, discharge, hematuria, flank pain Breast:Breast lumps, nipple discharge, bleeding.  Musculoskeletal: Denies arthralgia, myalgia, stiffness, Jt. Swelling, pain, limp, and strain/sprain. Skin: Denies puritis, rash, hives, warts, acne, eczema, changing in skin lesion Neuro: No weakness, tremor, incoordination, spasms, paresthesia, pain Psychiatric: Denies confusion, memory loss, sensory loss Endocrine: Denies change in weight, skin, hair change, nocturia, and paresthesia, diabetic polys, visual blurring, hyper / hypo glycemic episodes.  Heme/Lymph: No excessive bleeding, bruising, enlarged lymph nodes.   Physical Exam  BP 106/62  Pulse 80  Temp 97.7 F   Resp 16  Ht 5\' 5"    Wt 149 lb 6.4 oz   BMI 24.86 kg/m2  General Appearance: Well nourished, in no apparent distress. Eyes: PERRLA, EOMs, conjunctiva no swelling or erythema, normal fundi and vessels. Sinuses: No frontal/maxillary tenderness ENT/Mouth: EACs patent / TMs  nl. Nares clear without erythema, swelling, mucoid exudates. Oral hygiene is good. No erythema, swelling, or exudate. Tongue normal, non-obstructing. Tonsils not swollen  or erythematous. Hearing normal.  Neck: Supple, thyroid normal. No bruits, nodes or JVD. Respiratory: Respiratory effort normal.  BS equal and clear bilateral without rales, rhonci, wheezing or stridor. Cardio: Heart sounds are normal with regular rate and rhythm and no murmurs, rubs or gallops. Peripheral pulses are normal and equal bilaterally without edema. No aortic or femoral bruits. Chest: symmetric with normal excursions and percussion. Breasts: Symmetric, without lumps, nipple discharge, retractions, or fibrocystic changes.  Abdomen: Flat, soft, with bowl sounds. Nontender, no guarding, rebound, hernias, masses, or organomegaly.  Lymphatics: Non tender without lymphadenopathy.  Genitourinary:  Musculoskeletal: Full ROM all peripheral extremities, joint stability, 5/5 strength, and normal gait. Skin: Warm and dry without rashes, lesions, cyanosis, clubbing or  ecchymosis.  Neuro: Cranial nerves intact, reflexes equal bilaterally. Normal muscle tone, no cerebellar symptoms. Sensation intact.  Pysch: Awake and oriented X 3, normal affect, Insight and Judgment appropriate.   Assessment and Plan  1. Annual Screening Examination 2. Hypertension  3. Hyperlipidemia 4. T1 IDDM 5. Vitamin D Deficiency  Continue prudent diet as discussed, weight control, BP monitoring, regular exercise, and medications. Discussed  med's effects and SE's. Screening labs and tests as requested with regular follow-up as recommended.  Long discussion with patient regarding much stricter diet as per Dr Fara Olden Fuhrman's book "The End of Diabetes" in anticipation of stopping Insulin . Patient also expressed desire to switch from [place] back to [facility] due to high CoPay.

## 2013-10-11 NOTE — Patient Instructions (Signed)

## 2013-10-12 LAB — RPR

## 2013-10-12 LAB — MICROALBUMIN / CREATININE URINE RATIO
CREATININE, URINE: 72 mg/dL
MICROALB/CREAT RATIO: 6.9 mg/g (ref 0.0–30.0)
Microalb, Ur: 0.5 mg/dL (ref 0.00–1.89)

## 2013-10-12 LAB — HEMOGLOBIN A1C
Hgb A1c MFr Bld: 6.9 % — ABNORMAL HIGH
Mean Plasma Glucose: 151 mg/dL — ABNORMAL HIGH

## 2013-10-12 LAB — BASIC METABOLIC PANEL WITHOUT GFR
BUN: 7 mg/dL (ref 6–23)
CO2: 31 meq/L (ref 19–32)
Calcium: 9.7 mg/dL (ref 8.4–10.5)
Chloride: 96 meq/L (ref 96–112)
Creat: 0.77 mg/dL (ref 0.50–1.10)
GFR, Est African American: >89 mL/min
GFR, Est Non African American: 86 mL/min
Glucose, Bld: 179 mg/dL — ABNORMAL HIGH (ref 70–99)
Potassium: 4.1 meq/L (ref 3.5–5.3)
Sodium: 137 meq/L (ref 135–145)

## 2013-10-12 LAB — HEPATIC FUNCTION PANEL
ALT: 17 U/L (ref 0–35)
AST: 14 U/L (ref 0–37)
Albumin: 4.1 g/dL (ref 3.5–5.2)
Alkaline Phosphatase: 79 U/L (ref 39–117)
Bilirubin, Direct: 0.1 mg/dL (ref 0.0–0.3)
Indirect Bilirubin: 0.3 mg/dL (ref 0.2–1.2)
Total Bilirubin: 0.4 mg/dL (ref 0.2–1.2)
Total Protein: 7.2 g/dL (ref 6.0–8.3)

## 2013-10-12 LAB — MAGNESIUM: MAGNESIUM: 1.8 mg/dL (ref 1.5–2.5)

## 2013-10-12 LAB — URINALYSIS, MICROSCOPIC ONLY
Bacteria, UA: NONE SEEN
CASTS: NONE SEEN
Crystals: NONE SEEN
Squamous Epithelial / LPF: NONE SEEN

## 2013-10-12 LAB — LIPID PANEL
Cholesterol: 209 mg/dL — ABNORMAL HIGH (ref 0–200)
HDL: 58 mg/dL
LDL Cholesterol: 78 mg/dL (ref 0–99)
Total CHOL/HDL Ratio: 3.6 ratio
Triglycerides: 363 mg/dL — ABNORMAL HIGH
VLDL: 73 mg/dL — ABNORMAL HIGH (ref 0–40)

## 2013-10-12 LAB — HEPATITIS C ANTIBODY: HCV Ab: NEGATIVE

## 2013-10-12 LAB — TSH: TSH: 1.43 u[IU]/mL (ref 0.350–4.500)

## 2013-10-12 LAB — HEPATITIS A ANTIBODY, TOTAL: HEP A TOTAL AB: NONREACTIVE

## 2013-10-12 LAB — HEPATITIS B CORE ANTIBODY, TOTAL: HEP B C TOTAL AB: NONREACTIVE

## 2013-10-12 LAB — HIV ANTIBODY (ROUTINE TESTING W REFLEX): HIV 1&2 Ab, 4th Generation: NONREACTIVE

## 2013-10-12 LAB — VITAMIN D 25 HYDROXY (VIT D DEFICIENCY, FRACTURES): Vit D, 25-Hydroxy: 83 ng/mL (ref 30–89)

## 2013-10-12 LAB — HEPATITIS B SURFACE ANTIBODY,QUALITATIVE: Hep B S Ab: NEGATIVE

## 2013-10-13 LAB — HEPATITIS B E ANTIBODY: Hepatitis Be Antibody: NONREACTIVE

## 2013-10-25 ENCOUNTER — Telehealth: Payer: Self-pay | Admitting: *Deleted

## 2013-10-25 MED ORDER — MELOXICAM 15 MG PO TABS: ORAL_TABLET | ORAL | Status: DC

## 2013-10-25 NOTE — Telephone Encounter (Signed)
Patient called and states wrist continues to be painful  Per Dr Melford Aase, try RX of meloxicam 15 mg . RX sent to [place]

## 2013-11-15 ENCOUNTER — Other Ambulatory Visit: Payer: Self-pay

## 2013-11-15 DIAGNOSIS — M25539 Pain in unspecified wrist: Secondary | ICD-10-CM

## 2014-02-15 ENCOUNTER — Ambulatory Visit (INDEPENDENT_AMBULATORY_CARE_PROVIDER_SITE_OTHER): Payer: Commercial Managed Care - HMO | Admitting: Physician Assistant

## 2014-02-15 ENCOUNTER — Encounter: Payer: Self-pay | Admitting: Physician Assistant

## 2014-02-15 VITALS — BP 110/62 | HR 76 | Temp 97.9°F | Resp 16 | Ht 64.0 in | Wt 153.0 lb

## 2014-02-15 DIAGNOSIS — F3289 Other specified depressive episodes: Secondary | ICD-10-CM

## 2014-02-15 DIAGNOSIS — F329 Major depressive disorder, single episode, unspecified: Secondary | ICD-10-CM

## 2014-02-15 DIAGNOSIS — Z0000 Encounter for general adult medical examination without abnormal findings: Secondary | ICD-10-CM

## 2014-02-15 DIAGNOSIS — Z79899 Other long term (current) drug therapy: Secondary | ICD-10-CM

## 2014-02-15 DIAGNOSIS — E559 Vitamin D deficiency, unspecified: Secondary | ICD-10-CM

## 2014-02-15 DIAGNOSIS — Z23 Encounter for immunization: Secondary | ICD-10-CM

## 2014-02-15 DIAGNOSIS — K2100 Gastro-esophageal reflux disease with esophagitis, without bleeding: Secondary | ICD-10-CM

## 2014-02-15 DIAGNOSIS — F32A Depression, unspecified: Secondary | ICD-10-CM

## 2014-02-15 DIAGNOSIS — Z789 Other specified health status: Secondary | ICD-10-CM

## 2014-02-15 DIAGNOSIS — E782 Mixed hyperlipidemia: Secondary | ICD-10-CM

## 2014-02-15 DIAGNOSIS — I10 Essential (primary) hypertension: Secondary | ICD-10-CM

## 2014-02-15 DIAGNOSIS — E109 Type 1 diabetes mellitus without complications: Secondary | ICD-10-CM

## 2014-02-15 MED ORDER — DICLOFENAC SODIUM 1 % TD GEL: 2.0000 g | Freq: Four times a day (QID) | TRANSDERMAL | Status: DC

## 2014-02-15 MED ORDER — GLIPIZIDE 5 MG PO TABS: 5.0000 mg | ORAL_TABLET | Freq: Every day | ORAL | Status: DC

## 2014-02-15 MED ORDER — LISINOPRIL 20 MG PO TABS: 20.0000 mg | ORAL_TABLET | Freq: Every day | ORAL | Status: DC

## 2014-02-15 NOTE — Progress Notes (Signed)
MEDICARE ANNUAL WELLNESS VISIT AND FOLLOW UP  Assessment:   1. Essential hypertension - CBC with Differential - BASIC METABOLIC PANEL WITH GFR - Hepatic function panel - TSH  2. GERD Diet controlled  3. T1 IDDM Discussed general issues about diabetes pathophysiology and management., Educational material distributed., Suggested low cholesterol diet., Encouraged aerobic exercise., Discussed foot care., Reminded to get yearly retinal exam. Stopped ziac and switched to lisinopril for heart/kidney protection - Hemoglobin A1c - HM DIABETES FOOT EXAM  4. Depression Follows with Dr. Toy Care, she is in remission at this time  5. Encounter for long-term (current) use of other medications - Magnesium  6. Hyperlipidemia - Lipid panel  7. Vitamin D Deficiency - Vit D  25 hydroxy (rtn osteoporosis monitoring)  8. Need for prophylactic vaccination and inoculation against influenza Will get in 2-3 weeks  9. Routine general medical examination at a health care facility  10. Patient had no falls in past year   Plan:   During the course of the visit the patient was educated and counseled about appropriate screening and preventive services including:    Pneumococcal vaccine   Influenza vaccine  Td vaccine  Screening electrocardiogram  Screening mammography  Bone densitometry screening  Colorectal cancer screening  Diabetes screening  Glaucoma screening  Nutrition counseling   Advanced directives: given info/requested  Screening recommendations, referrals:  Vaccinations: Please see documentation below and orders this visit.   Nutrition assessed and recommended  Colonoscopy due 2022 Mammogram requested Pap smear not indicated Pelvic exam not indicated Recommended yearly ophthalmology/optometry visit for glaucoma screening and checkup Recommended yearly dental visit for hygiene and checkup Advanced directives - requested  Conditions/risks identified: BMI:  Discussed weight loss, diet, and increase physical activity.  Increase physical activity: AHA recommends 150 minutes of physical activity a week.  Medications reviewed DEXA- not indicated Diabetes is not at goal, ACE/ARB therapy: Yes. started this visit Urinary Incontinence is an issue: discussed non pharmacology and pharmacology options.  Fall risk: low- discussed PT, home fall assessment, medications.    Subjective:   Mindy Richard is a 58 y.o. female who presents for Medicare Annual Wellness Visit and 3 month follow up on hypertension, diabetes, hyperlipidemia, vitamin D def.  Date of last medicare wellness visit is unknown.  58 y.o. female  presents for 3 month follow up with hypertension, hyperlipidemia, diabetes and vitamin D. Her blood pressure has been controlled at home, today their BP is BP: 110/62 mmHg She does not workout. She denies chest pain, shortness of breath, dizziness.  She is on cholesterol medication and denies myalgias. Her cholesterol is at goal. The cholesterol last visit was:   Lab Results  Component Value Date   CHOL 209* 10/11/2013   HDL 58 10/11/2013   LDLCALC 78 10/11/2013   TRIG 363* 10/11/2013   CHOLHDL 3.6 10/11/2013   She has been working on diet and exercise for Diabetes, she checks her sugars and it has been running 120's, she is on 8 units of the lantus she did have a dexamethasone shot in July for her right wrist, and denies polydipsia, polyuria and visual disturbances. She has bilateral toe numbness, no ulcers. She is not on ACE/ARB.  Last A1C in the office was:  Lab Results  Component Value Date   HGBA1C 6.9* 10/11/2013   Patient is on Vitamin D supplement. Lab Results  Component Value Date   VD25OH 83 10/11/2013      Names of Other Physician/Practitioners you currently use: 1. [facility]- here for primary care 2. Eye doctor Dr. Marvel Plan- 12/2013 3. Dentist Dr. Vanessa Kick Patient Care Team: Unk Pinto,  MD as PCP - General (Internal Medicine) Missy Sabins, MD as Consulting Physician (Gastroenterology) Haywood Lasso, MD as Consulting Physician (General Surgery) Sharene Butters, MD as Consulting Physician (Obstetrics and Gynecology)  Medication Review Current Outpatient Prescriptions on File Prior to Visit  Medication Sig Dispense Refill  . acetaminophen (TYLENOL) 500 MG tablet Take 1,000 mg by mouth every 6 (six) hours as needed. For pain      . bisoprolol-hydrochlorothiazide (ZIAC) 10-6.25 MG per tablet Take 1/2 to 1 tablet daily as directed for BP  90 tablet  99  . buPROPion (WELLBUTRIN XL) 300 MG 24 hr tablet Take 300 mg by mouth daily.       . cholecalciferol (VITAMIN D) 1000 UNITS tablet Take 2,000 Units by mouth daily.       Marland Kitchen ezetimibe (ZETIA) 10 MG tablet Take 1 tablet (10 mg total) by mouth daily.  90 tablet  99  . fish oil-omega-3 fatty acids 1000 MG capsule Take 1 g by mouth daily.      . insulin glargine (LANTUS) 100 UNIT/ML injection Inject 0.4 mLs (40 Units total) into the skin at bedtime. Taking 8 units qd  10 mL  99  . meloxicam (MOBIC) 15 MG tablet Takes 1 tab daily with food for pain  30 tablet  0  . metFORMIN (GLUCOPHAGE XR) 500 MG 24 hr tablet Take 1 to 2 tablets 2 x day after a meal for diabetes and use Immodium if needed for diarrhea.  360 tablet  99  . omeprazole (PRILOSEC) 40 MG capsule Take 1 capsule (40 mg total) by mouth daily.  90 capsule  99  . pravastatin (PRAVACHOL) 40 MG tablet Take 1 tablet (40 mg total) by mouth at bedtime.  90 tablet  99  . ranitidine (ZANTAC) 300 MG tablet Take 300 mg by mouth at bedtime. prn      . venlafaxine (EFFEXOR) 75 MG tablet Take 75 mg by mouth daily.      Marland Kitchen zolpidem (AMBIEN) 10 MG tablet Take 10 mg by mouth at bedtime.        No current facility-administered medications on file prior to visit.    Current Problems (verified) Patient Active Problem List   Diagnosis Date Noted  . T1 IDDM 08/10/2013  . Vitamin D Deficiency  04/03/2013  . Encounter for long-term (current) use of other medications 04/03/2013  . Hyperlipidemia 04/03/2013  . Hypertension 05/09/2011  . Depression 05/09/2011  . GERD 05/09/2011    Screening Tests Health Maintenance  Topic Date Due  . Ophthalmology Exam  02/27/1966  . Pap Smear  02/27/1974  . Tetanus/tdap  02/28/1975  . Mammogram  02/27/2006  . Colonoscopy  02/27/2006  . Pneumococcal Polysaccharide Vaccine (##2) 01/18/2011  . Influenza Vaccine  12/17/2013  . Hemoglobin A1c  04/13/2014  . Foot Exam  10/12/2014  . Urine Microalbumin  10/12/2014     Immunization History  Administered Date(s) Administered  . DT 08/10/2013  . Influenza Split 04/04/2013  . Pneumococcal-Unspecified 01/17/2006    Preventative care: Last colonoscopy: 2012 due 2022 Last mammogram: Dr. Delfino Lovett Last pap smear/pelvic exam: Dr. Delfino Lovett DEXA:N/A 01/2012 Myoview stress test  Prior vaccinations: TD or Tdap: 2015  Influenza: 2-3 weeks  Pneumococcal: 2007 Shingles/Zostavax: declines  History reviewed: allergies, current medications, past family history, past medical history, past social history, past surgical history and  problem list  Risk Factors: Osteoporosis/FallRisk: postmenopausal estrogen deficiency and dietary calcium and/or vitamin D deficiency In the past year have you fallen or had a near fall?:No History of fracture in the past year: no  Tobacco History  Substance Use Topics  . Smoking status: Never Smoker   . Smokeless tobacco: Never Used  . Alcohol Use: No   She does not smoke.  Patient is not a former smoker. Are there smokers in your home (other than you)?  No  Alcohol Current alcohol use: none  Caffeine Current caffeine use: coffee 2 /day  Exercise Current exercise: walking  Nutrition/Diet Current diet: in general, a "healthy" diet    Cardiac risk factors: diabetes mellitus, dyslipidemia, hypertension, obesity (BMI >= 30 kg/m2) and sedentary  lifestyle.  Depression Screen (Note: if answer to either of the following is "Yes", a more complete depression screening is indicated)   Q1: Over the past two weeks, have you felt down, depressed or hopeless? No  Q2: Over the past two weeks, have you felt little interest or pleasure in doing things? No  Have you lost interest or pleasure in daily life? No  Do you often feel hopeless? No  Do you cry easily over simple problems? No  Activities of Daily Living In your present state of health, do you have any difficulty performing the following activities?:  Driving? No Managing money?  No Feeding yourself? No Getting from bed to chair? No Climbing a flight of stairs? No Preparing food and eating?: No Bathing or showering? No Getting dressed: No Getting to the toilet? No Using the toilet:No Moving around from place to place: No   Are you sexually active?  No  Do you have more than one partner?  No  Vision Difficulties: No  Hearing Difficulties: No Do you often ask people to speak up or repeat themselves? No Do you experience ringing or noises in your ears? No Do you have difficulty understanding soft or whispered voices? No  Cognition  Do you feel that you have a problem with memory?No  Do you often misplace items? No  Do you feel safe at home?  Yes  Advanced directives Does patient have a [place]? No Does patient have a Living Will? No   Objective:   Blood pressure 110/62, pulse 76, temperature 97.9 F (36.6 C), resp. rate 16, height 5\' 4"  (1.626 m), weight 153 lb (69.4 kg). Body mass index is 26.25 kg/(m^2).  General appearance: alert, no distress, WD/WN,  female Cognitive Testing  Alert? Yes  Normal Appearance?Yes  Oriented to person? Yes  Place? Yes   Time? Yes  Recall of three objects?  Yes  Can perform simple calculations? Yes  Displays appropriate judgment?Yes  Can read the correct time from a watch face?Yes  HEENT: normocephalic,  sclerae anicteric, TMs pearly, nares patent, no discharge or erythema, pharynx normal Oral cavity: MMM, no lesions Neck: supple, no lymphadenopathy, no thyromegaly, no masses Heart: RRR, normal S1, S2, no murmurs Lungs: CTA bilaterally, no wheezes, rhonchi, or rales Abdomen: +bs, soft, non tender, non distended, no masses, no hepatomegaly, no splenomegaly Musculoskeletal: nontender, no swelling, no obvious deformity Extremities: no edema, no cyanosis, no clubbing Pulses: 2+ symmetric, upper and lower extremities, normal cap refill Neurological: alert, oriented x 3, CN2-12 intact, strength normal upper extremities and lower extremities, sensation normal throughout, DTRs 2+ throughout, no cerebellar signs, gait normal Psychiatric: normal affect, behavior normal, pleasant  Breast: defer Gyn: defer Rectal: defer  Medicare  Attestation I have personally reviewed: The patient's medical and social history Their use of alcohol, tobacco or illicit drugs Their current medications and supplements The patient's functional ability including ADLs,fall risks, home safety risks, cognitive, and hearing and visual impairment Diet and physical activities Evidence for depression or mood disorders  The patient's weight, height, BMI, and visual acuity have been recorded in the chart.  I have made referrals, counseling, and provided education to the patient based on review of the above and I have provided the patient with a written personalized care plan for preventive services.     Vicie Mutters, PA-C   02/15/2014

## 2014-02-15 NOTE — Patient Instructions (Addendum)
ACE inhibitors are blood pressure medications that protect your heart and kidneys. It can cause two symptoms: The most common symptom is a dry cough/tickle in your throat that can happen the first day you take it or 5 years after you have been taking it. Please call us if you have this and we can switch it to a different medications. The least common side effect is called angioedema which is swelling of your lips and tongue and can cause problems with your breathing. This is a very very rare side effect but very serious. If this happens please stop the medication and go to the ER.   Please stop the ziac and take 1/2 of the lisinopril for 1 week and if your BP is above 130/80 then take a whole lisinopril    Bad carbs also include fruit juice, alcohol, and sweet tea. These are empty calories that do not signal to your brain that you are full.   Please remember the good carbs are still carbs which convert into sugar. So please measure them out no more than 1/2-1 cup of rice, oatmeal, pasta, and beans.  Veggies are however free foods! Pile them on.   I like lean protein at every meal such as chicken, [place], pork chops, cottage cheese, etc. Just do not fry these meats and please center your meal around vegetable, the meats should be a side dish.   No all fruit is created equal. Please see the list below, the fruit at the bottom is higher in sugars than the fruit at the top

## 2014-02-16 LAB — LIPID PANEL
CHOLESTEROL: 184 mg/dL (ref 0–200)
HDL: 64 mg/dL (ref >39)
LDL Cholesterol: 61 mg/dL (ref 0–99)
Total CHOL/HDL Ratio: 2.9 Ratio
Triglycerides: 294 mg/dL — ABNORMAL HIGH (ref <150)
VLDL: 59 mg/dL — AB (ref 0–40)

## 2014-02-16 LAB — VITAMIN D 25 HYDROXY (VIT D DEFICIENCY, FRACTURES): Vit D, 25-Hydroxy: 70 ng/mL (ref 30–89)

## 2014-02-16 LAB — CBC WITH DIFFERENTIAL/PLATELET
Basophils Absolute: 0 10*3/uL (ref 0.0–0.1)
Basophils Relative: 0 % (ref 0–1)
EOS ABS: 0.3 10*3/uL (ref 0.0–0.7)
Eosinophils Relative: 3 % (ref 0–5)
HCT: 39.3 % (ref 36.0–46.0)
Hemoglobin: 13.6 g/dL (ref 12.0–15.0)
Lymphocytes Relative: 34 % (ref 12–46)
Lymphs Abs: 3.1 10*3/uL (ref 0.7–4.0)
MCH: 29.3 pg (ref 26.0–34.0)
MCHC: 34.6 g/dL (ref 30.0–36.0)
MCV: 84.7 fL (ref 78.0–100.0)
MONO ABS: 0.6 10*3/uL (ref 0.1–1.0)
Monocytes Relative: 7 % (ref 3–12)
Neutro Abs: 5 10*3/uL (ref 1.7–7.7)
Neutrophils Relative %: 56 % (ref 43–77)
PLATELETS: 357 10*3/uL (ref 150–400)
RBC: 4.64 MIL/uL (ref 3.87–5.11)
RDW: 14.2 % (ref 11.5–15.5)
WBC: 9 10*3/uL (ref 4.0–10.5)

## 2014-02-16 LAB — BASIC METABOLIC PANEL WITH GFR
BUN: 10 mg/dL (ref 6–23)
CALCIUM: 9.5 mg/dL (ref 8.4–10.5)
CO2: 26 mEq/L (ref 19–32)
Chloride: 101 mEq/L (ref 96–112)
Creat: 0.75 mg/dL (ref 0.50–1.10)
GFR, EST NON AFRICAN AMERICAN: 89 mL/min
GFR, Est African American: >89 mL/min
Glucose, Bld: 151 mg/dL — ABNORMAL HIGH (ref 70–99)
Potassium: 4.5 mEq/L (ref 3.5–5.3)
Sodium: 138 mEq/L (ref 135–145)

## 2014-02-16 LAB — HEPATIC FUNCTION PANEL
ALT: 15 U/L (ref 0–35)
AST: 14 U/L (ref 0–37)
Albumin: 4.3 g/dL (ref 3.5–5.2)
Alkaline Phosphatase: 73 U/L (ref 39–117)
BILIRUBIN DIRECT: 0.1 mg/dL (ref 0.0–0.3)
BILIRUBIN INDIRECT: 0.3 mg/dL (ref 0.2–1.2)
Total Bilirubin: 0.4 mg/dL (ref 0.2–1.2)
Total Protein: 7.2 g/dL (ref 6.0–8.3)

## 2014-02-16 LAB — HEMOGLOBIN A1C
Hgb A1c MFr Bld: 6.7 % — ABNORMAL HIGH (ref <5.7)
Mean Plasma Glucose: 146 mg/dL — ABNORMAL HIGH (ref <117)

## 2014-02-16 LAB — TSH: TSH: 1.441 u[IU]/mL (ref 0.350–4.500)

## 2014-02-16 LAB — MAGNESIUM: MAGNESIUM: 1.9 mg/dL (ref 1.5–2.5)

## 2014-03-30 ENCOUNTER — Telehealth: Payer: Self-pay

## 2014-03-30 NOTE — Telephone Encounter (Signed)
Patient called and left message that she has only been taking the Lisinopril 20 Mg at 1/2 daily and BP is 118/70, returned call to patient and LMOM at [phone], per Vicie Mutters, PA she should continue at 1/2 tablet and monitor BP

## 2014-04-03 ENCOUNTER — Ambulatory Visit (INDEPENDENT_AMBULATORY_CARE_PROVIDER_SITE_OTHER): Payer: Commercial Managed Care - HMO

## 2014-04-03 VITALS — Temp 97.7°F

## 2014-04-03 DIAGNOSIS — Z23 Encounter for immunization: Secondary | ICD-10-CM

## 2014-04-03 MED ORDER — LISINOPRIL 20 MG PO TABS: 20.0000 mg | ORAL_TABLET | Freq: Every day | ORAL | Status: DC

## 2014-04-08 ENCOUNTER — Other Ambulatory Visit: Payer: Self-pay | Admitting: Physician Assistant

## 2014-05-02 ENCOUNTER — Other Ambulatory Visit: Payer: Self-pay

## 2014-05-02 MED ORDER — PRAVASTATIN SODIUM 40 MG PO TABS: 40.0000 mg | ORAL_TABLET | Freq: Every day | ORAL | Status: DC

## 2014-05-02 MED ORDER — OMEPRAZOLE 40 MG PO CPDR: 40.0000 mg | DELAYED_RELEASE_CAPSULE | Freq: Every day | ORAL | Status: DC

## 2014-05-24 ENCOUNTER — Encounter: Payer: Self-pay | Admitting: Internal Medicine

## 2014-05-24 ENCOUNTER — Ambulatory Visit (INDEPENDENT_AMBULATORY_CARE_PROVIDER_SITE_OTHER): Payer: Commercial Managed Care - HMO | Admitting: Internal Medicine

## 2014-05-24 VITALS — BP 104/62 | HR 88 | Temp 97.7°F | Resp 16 | Ht 64.0 in | Wt 152.6 lb

## 2014-05-24 DIAGNOSIS — E559 Vitamin D deficiency, unspecified: Secondary | ICD-10-CM | POA: Diagnosis not present

## 2014-05-24 DIAGNOSIS — E782 Mixed hyperlipidemia: Secondary | ICD-10-CM | POA: Diagnosis not present

## 2014-05-24 DIAGNOSIS — Z79899 Other long term (current) drug therapy: Secondary | ICD-10-CM

## 2014-05-24 DIAGNOSIS — E109 Type 1 diabetes mellitus without complications: Secondary | ICD-10-CM

## 2014-05-24 DIAGNOSIS — I10 Essential (primary) hypertension: Secondary | ICD-10-CM | POA: Diagnosis not present

## 2014-05-24 MED ORDER — LOSARTAN POTASSIUM 100 MG PO TABS: ORAL_TABLET | ORAL | Status: DC

## 2014-05-24 MED ORDER — METFORMIN HCL ER 500 MG PO TB24: ORAL_TABLET | ORAL | Status: DC

## 2014-05-24 NOTE — Progress Notes (Signed)
Patient ID: Mindy Richard, female   DOB: 06-18-1955, 59 y.o.   MRN: [id number]   This very nice 59 y.o.MWF presents for 3 month follow up with Hypertension, Hyperlipidemia, Pre-Diabetes and Vitamin D Deficiency.    Patient is treated for HTN & BP has been controlled at home. Today's BP: 104/62 mmHg. Patient has had no complaints of any cardiac type chest pain, palpitations, dyspnea/orthopnea/PND, dizziness, claudication, or dependent edema. Patient was switched from [place] to Lotensin at last OV for diabetic renal protection and has since developed a daily dry tickle cough.    Hyperlipidemia is controlled with diet & meds. Patient denies myalgias or other med SE's. Last Lipids were at goal  - Total Chol 184; HDL  64; LDL  61; with elevated Trig 294 on 02/15/2014.   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  Last A1c was  6.7%  02/15/2014. She is on low dose lantus  And in addition is very concernewd about the cost of her Kombiglyze = $600 last fill for 90 day's Rx.    Further, the patient also has history of Vitamin D Deficiency  and supplements vitamin D without any suspected side-effects. Last vitamin D was  70 on  02/15/2014.   Medication List   bisoprolol-hydrochlorothiazide 10-6.25 MG per tablet  Commonly known as:  ZIAC  Take 1/2 to 1 tablet daily as directed for BP     cholecalciferol 1000 UNITS tablet  Commonly known as:  VITAMIN D  Take 2,000 Units by mouth daily.     diclofenac sodium 1 % Gel  Commonly known as:  VOLTAREN  Apply 2 g topically 4 (four) times daily.     ezetimibe 10 MG tablet  Commonly known as:  ZETIA  Take 1 tablet (10 mg total) by mouth daily.     fish oil-omega-3 fatty acids 1000 MG capsule  Take 1 g by mouth daily.     glipiZIDE 5 MG tablet  Commonly known as:  GLUCOTROL  TAKE 1 TABLET (5 MG TOTAL) BY MOUTH DAILY BEFORE BREAKFAST.     KOMBIGLYZE XR 2.09-998 MG  Tb24  Generic drug:  Saxagliptin-Metformin     LANTUS SOLOSTAR 100 UNIT/ML Solostar Pen  Generic drug:  Insulin Glargine     lisinopril 20 MG tablet  Commonly known as:  PRINIVIL,ZESTRIL  Take 1 tablet daily for BP and Kidney Protection     meloxicam 15 MG tablet  Commonly known as:  MOBIC  Takes 1 tab daily with food for pain     metFORMIN 500 MG 24 hr tablet  Commonly known as:  GLUCOPHAGE XR  Take 1 to 2 tablets 2 x day after a meal for diabetes and use Immodium if needed for diarrhea.     omeprazole 40 MG capsule  Commonly known as:  PRILOSEC  Take 1 capsule (40 mg total) by mouth daily.     pravastatin 40 MG tablet  Commonly known as:  PRAVACHOL  Take 1 tablet (40 mg total) by mouth daily.     ranitidine 300 MG tablet  Commonly known as:  ZANTAC  Take 300 mg by mouth at bedtime. prn     venlafaxine XR 75 MG 24 hr capsule  Commonly known as:  EFFEXOR-XR     zolpidem 10 MG tablet  Commonly known as:  AMBIEN  Take 10 mg by mouth at bedtime.     Allergies  Allergen  Reactions  . Codeine Palpitations and Other (See Comments)    Makes her head feel "weird"  . Glucophage [Metformin Hcl] Diarrhea  . Nsaids Other (See Comments)    "upset stomach"   PMHx:   Past Medical History  Diagnosis Date  . Hypertension   . Hypercholesteremia   . GERD (gastroesophageal reflux disease)   . Mental disorder   . Anxiety   . Depression   . Chest pain 04/2011    saw Dr Ron Parker..not heart related  sent to GI dr. and found gallstones.  . Complication of anesthesia 2001; 20012    "slow to wake up"  . Type II diabetes mellitus 2003  . H/O hiatal hernia    Immunization History  Administered Date(s) Administered  . DT 08/10/2013  . Influenza Split 04/04/2013, 04/03/2014  . Pneumococcal-Unspecified 01/17/2006   Past Surgical History  Procedure Laterality Date  . Shoulder arthroscopy w/ rotator cuff repair  2012    left  . Cesarean section  1985  . Cholecystectomy  09/02/11  .  Carpal tunnel release  2001    right  . Tonsillectomy  ~ 1970  . Dilation and curettage of uterus  1984  . Tubal ligation  1985?  [facility] Colonoscopy    . Cholecystectomy  09/02/2011    Procedure: LAPAROSCOPIC CHOLECYSTECTOMY WITH INTRAOPERATIVE CHOLANGIOGRAM;  Surgeon: Haywood Lasso, MD;  Location: [place];  Service: General;  Laterality: N/A;  . Ercp  09/03/2011    Procedure: ENDOSCOPIC RETROGRADE CHOLANGIOPANCREATOGRAPHY (ERCP);  Surgeon: Missy Sabins, MD;  Location: [facility] ENDOSCOPY;  Service: Endoscopy;  Laterality: N/A;  sphincterotomy and stone extraction   FHx:    Reviewed / unchanged  SHx:    Reviewed / unchanged  Systems Review:  Constitutional: Denies fever, chills, wt changes, headaches, insomnia, fatigue, night sweats, change in appetite. Eyes: Denies redness, blurred vision, diplopia, discharge, itchy, watery eyes.  ENT: Denies discharge, congestion, post nasal drip, epistaxis, sore throat, earache, hearing loss, dental pain, tinnitus, vertigo, sinus pain, snoring.  CV: Denies chest pain, palpitations, irregular heartbeat, syncope, dyspnea, diaphoresis, orthopnea, PND, claudication or edema. Respiratory: denies cough, dyspnea, DOE, pleurisy, hoarseness, laryngitis, wheezing.  Gastrointestinal: Denies dysphagia, odynophagia, heartburn, reflux, water brash, abdominal pain or cramps, nausea, vomiting, bloating, diarrhea, constipation, hematemesis, melena, hematochezia  or hemorrhoids. Genitourinary: Denies dysuria, frequency, urgency, nocturia, hesitancy, discharge, hematuria or flank pain. Musculoskeletal: Denies arthralgias, myalgias, stiffness, jt. swelling, pain, limping or strain/sprain.  Skin: Denies pruritus, rash, hives, warts, acne, eczema or change in skin lesion(s). Neuro: No weakness, tremor, incoordination, spasms, paresthesia or pain. Psychiatric: Denies confusion, memory loss or sensory loss. Endo: Denies change in weight, skin or hair change.  Heme/Lymph: No excessive  bleeding, bruising or enlarged lymph nodes.  Physical Exam  BP 104/62 Pulse 88  Temp 97.7 F  Resp 16  Ht 5\' 4"    Wt 152 lb 9.6 oz  BMI 26.18   Appears well nourished and in no distress.  Eyes: PERRLA, EOMs, conjunctiva no swelling or erythema. Sinuses: No frontal/maxillary tenderness ENT/Mouth: EAC's clear, TM's nl w/o erythema, bulging. Nares clear w/o erythema, swelling, exudates. Oropharynx clear without erythema or exudates. Oral hygiene is good. Tongue normal, non obstructing. Hearing intact.  Neck: Supple. Thyroid nl. Car 2+/2+ without bruits, nodes or JVD. Chest: Respirations nl with BS clear & equal w/o rales, rhonchi, wheezing or stridor.  Cor: Heart sounds normal w/ regular rate and rhythm without sig. murmurs, gallops, clicks, or rubs. Peripheral pulses normal and equal  without edema.  Abdomen: Soft & bowel sounds normal. Non-tender w/o guarding, rebound, hernias, masses, or organomegaly.  Lymphatics: Unremarkable.  Musculoskeletal: Full ROM all peripheral extremities, joint stability, 5/5 strength, and normal gait.  Skin: Warm, dry without exposed rashes, lesions or ecchymosis apparent.  Neuro: Cranial nerves intact, reflexes equal bilaterally. Sensory-motor testing grossly intact. Tendon reflexes grossly intact.  Pysch: Alert & oriented x 3.  Insight and judgement nl & appropriate. No ideations.  Assessment and Plan:  1. Hypertension - Continue monitor blood pressure at home. Switch Lotensin to Losartan.  2. Hyperlipidemia - Continue diet/meds, exercise,& lifestyle modifications. Continue monitor periodic cholesterol/liver & renal functions   3. T1_IDDM  - Continue diet, exercise, lifestyle modifications. Discussed switching kombiglyze to Metformin XR and also try tapering lantus by 2 units every 2 weeks to allow weight loss as long as CBG's <200 mg%.  Has Glipizide to use prn. Monitor appropriate labs.  4. Vitamin D Deficiency - Continue  supplementation.   Recommended regular exercise, BP monitoring, weight control, and discussed med and SE's. Recommended labs to assess and monitor clinical status. Further disposition pending results of labs.

## 2014-05-24 NOTE — Patient Instructions (Addendum)
Begin taper [place] 2 units every 2 weeks as discussed  ++++++++++++++++++++++++++++++++++++++++++  Recommend the book "The END of DIETING" by Dr Baker Janus   and the book "The END of DIABETES " by Dr Excell Seltzer  At [email or web address] - get book & Audio CD's      Being diabetic has a  300% increased risk for heart attack, stroke, cancer, and alzheimer- type vascular dementia. It is very important that you work harder with diet by avoiding all foods that are white except chicken & fish. Avoid white rice (brown & wild rice is OK), white potatoes (sweetpotatoes in moderation is OK), White bread or wheat bread or anything made out of white flour like bagels, donuts, rolls, buns, biscuits, cakes, pastries, cookies, pizza crust, and pasta (made from white flour & egg whites) - vegetarian pasta or spinach or wheat pasta is OK. Multigrain breads like Arnold's or Pepperidge Farm, or multigrain sandwich thins or flatbreads.  Diet, exercise and weight loss can reverse and cure diabetes in the early stages.  Diet, exercise and weight loss is very important in the control and prevention of complications of diabetes which affects every system in your body, ie. Brain - dementia/stroke, eyes - glaucoma/blindness, heart - heart attack/heart failure, kidneys - dialysis, stomach - gastric paralysis, intestines - malabsorption, nerves - severe painful neuritis, circulation - gangrene & loss of a leg(s), and finally cancer and Alzheimers.    I recommend avoid fried & greasy foods,  sweets/candy, white rice (brown or wild rice or Quinoa is OK), white potatoes (sweet potatoes are OK) - anything made from white flour - bagels, doughnuts, rolls, buns, biscuits,white and wheat breads, pizza crust and traditional pasta made of white flour & egg white(vegetarian pasta or spinach or wheat pasta is OK).  Multi-grain bread is OK - like multi-grain flat bread or sandwich thins. Avoid alcohol in excess. Exercise is also important.    Eat  all the vegetables you want - avoid meat, especially red meat and dairy - especially cheese.  Cheese is the most concentrated form of trans-fats which is the worst thing to clog up our arteries. Veggie cheese is OK which can be found in the fresh produce section at [facility] or [employer] or [employer]

## 2014-05-25 LAB — HEPATIC FUNCTION PANEL
ALBUMIN: 4.4 g/dL (ref 3.5–5.2)
ALT: 16 U/L (ref 0–35)
AST: 17 U/L (ref 0–37)
Alkaline Phosphatase: 71 U/L (ref 39–117)
BILIRUBIN DIRECT: 0.1 mg/dL (ref 0.0–0.3)
BILIRUBIN INDIRECT: 0.3 mg/dL (ref 0.2–1.2)
TOTAL PROTEIN: 7.4 g/dL (ref 6.0–8.3)
Total Bilirubin: 0.4 mg/dL (ref 0.2–1.2)

## 2014-05-25 LAB — BASIC METABOLIC PANEL WITH GFR
BUN: 10 mg/dL (ref 6–23)
CO2: 25 meq/L (ref 19–32)
Calcium: 9.7 mg/dL (ref 8.4–10.5)
Chloride: 98 mEq/L (ref 96–112)
Creat: 0.73 mg/dL (ref 0.50–1.10)
GFR, Est African American: >89 mL/min
GLUCOSE: 156 mg/dL — AB (ref 70–99)
POTASSIUM: 4.2 meq/L (ref 3.5–5.3)
Sodium: 140 mEq/L (ref 135–145)

## 2014-05-25 LAB — LIPID PANEL
CHOLESTEROL: 198 mg/dL (ref 0–200)
HDL: 72 mg/dL (ref >39)
LDL Cholesterol: 88 mg/dL (ref 0–99)
Total CHOL/HDL Ratio: 2.8 Ratio
Triglycerides: 189 mg/dL — ABNORMAL HIGH (ref <150)
VLDL: 38 mg/dL (ref 0–40)

## 2014-05-25 LAB — CBC WITH DIFFERENTIAL/PLATELET
Basophils Absolute: 0 10*3/uL (ref 0.0–0.1)
Basophils Relative: 0 % (ref 0–1)
Eosinophils Absolute: 0.5 10*3/uL (ref 0.0–0.7)
Eosinophils Relative: 5 % (ref 0–5)
HCT: 40.1 % (ref 36.0–46.0)
Hemoglobin: 13.7 g/dL (ref 12.0–15.0)
Lymphocytes Relative: 31 % (ref 12–46)
Lymphs Abs: 2.9 10*3/uL (ref 0.7–4.0)
MCH: 29 pg (ref 26.0–34.0)
MCHC: 34.2 g/dL (ref 30.0–36.0)
MCV: 85 fL (ref 78.0–100.0)
MONOS PCT: 7 % (ref 3–12)
MPV: 9.5 fL (ref 8.6–12.4)
Monocytes Absolute: 0.7 10*3/uL (ref 0.1–1.0)
NEUTROS ABS: 5.4 10*3/uL (ref 1.7–7.7)
Neutrophils Relative %: 57 % (ref 43–77)
PLATELETS: 401 10*3/uL — AB (ref 150–400)
RBC: 4.72 MIL/uL (ref 3.87–5.11)
RDW: 13.9 % (ref 11.5–15.5)
WBC: 9.4 10*3/uL (ref 4.0–10.5)

## 2014-05-25 LAB — INSULIN, FASTING: Insulin fasting, serum: 29.3 u[IU]/mL — ABNORMAL HIGH (ref 2.0–19.6)

## 2014-05-25 LAB — TSH: TSH: 2.11 u[IU]/mL (ref 0.350–4.500)

## 2014-05-25 LAB — MAGNESIUM: MAGNESIUM: 2 mg/dL (ref 1.5–2.5)

## 2014-05-25 LAB — VITAMIN D 25 HYDROXY (VIT D DEFICIENCY, FRACTURES): Vit D, 25-Hydroxy: 44 ng/mL (ref 30–100)

## 2014-05-25 LAB — HEMOGLOBIN A1C
Hgb A1c MFr Bld: 6.8 % — ABNORMAL HIGH (ref <5.7)
Mean Plasma Glucose: 148 mg/dL — ABNORMAL HIGH (ref <117)

## 2014-05-31 ENCOUNTER — Other Ambulatory Visit: Payer: Self-pay | Admitting: Internal Medicine

## 2014-06-12 ENCOUNTER — Telehealth: Payer: Self-pay | Admitting: *Deleted

## 2014-06-12 NOTE — Telephone Encounter (Signed)
NPI = [id number]     Ph = [phone] Mindy Richard  Pt needs a referral to =  DR Milly Jakob at [facility] [facility] for right wrist pain. Pt was scheduled & at appointment on Monday 1/25 but due to not being able to get a [employer] referral pt left appt & is waiting for our ok to r/s'd asap. Please contact pt with status

## 2014-06-13 ENCOUNTER — Telehealth: Payer: Self-pay

## 2014-06-13 NOTE — Telephone Encounter (Signed)
[employer] referral approved for patient to see Dr.Thompson for right wrist pain. Left message for patient to return call.

## 2014-06-15 DIAGNOSIS — M65841 Other synovitis and tenosynovitis, right hand: Secondary | ICD-10-CM | POA: Diagnosis not present

## 2014-07-07 ENCOUNTER — Other Ambulatory Visit: Payer: Self-pay | Admitting: Internal Medicine

## 2014-07-17 DIAGNOSIS — M65841 Other synovitis and tenosynovitis, right hand: Secondary | ICD-10-CM | POA: Diagnosis not present

## 2014-07-25 ENCOUNTER — Other Ambulatory Visit: Payer: Self-pay | Admitting: Internal Medicine

## 2014-08-31 ENCOUNTER — Ambulatory Visit: Payer: Self-pay | Admitting: Physician Assistant

## 2014-08-31 ENCOUNTER — Encounter: Payer: Self-pay | Admitting: Internal Medicine

## 2014-08-31 ENCOUNTER — Ambulatory Visit (INDEPENDENT_AMBULATORY_CARE_PROVIDER_SITE_OTHER): Payer: Commercial Managed Care - HMO | Admitting: Internal Medicine

## 2014-08-31 VITALS — BP 118/72 | HR 100 | Temp 97.7°F | Resp 16 | Ht 64.0 in | Wt 152.0 lb

## 2014-08-31 DIAGNOSIS — R413 Other amnesia: Secondary | ICD-10-CM

## 2014-08-31 DIAGNOSIS — I10 Essential (primary) hypertension: Secondary | ICD-10-CM | POA: Diagnosis not present

## 2014-08-31 DIAGNOSIS — E782 Mixed hyperlipidemia: Secondary | ICD-10-CM

## 2014-08-31 DIAGNOSIS — Z79899 Other long term (current) drug therapy: Secondary | ICD-10-CM | POA: Diagnosis not present

## 2014-08-31 DIAGNOSIS — F329 Major depressive disorder, single episode, unspecified: Secondary | ICD-10-CM | POA: Diagnosis not present

## 2014-08-31 DIAGNOSIS — E559 Vitamin D deficiency, unspecified: Secondary | ICD-10-CM

## 2014-08-31 DIAGNOSIS — F32A Depression, unspecified: Secondary | ICD-10-CM

## 2014-08-31 DIAGNOSIS — E109 Type 1 diabetes mellitus without complications: Secondary | ICD-10-CM | POA: Diagnosis not present

## 2014-08-31 DIAGNOSIS — D649 Anemia, unspecified: Secondary | ICD-10-CM | POA: Diagnosis not present

## 2014-08-31 NOTE — Patient Instructions (Signed)
GETTING OFF OF PPI's    Nexium/protonix/prilosec/Omeprazole/Dexilant/Aciphex are called PPI's, they are great at healing your stomach but should only be taken for a short period of time.     Recent studies have shown that taken for a long time they  can increase the risk of osteoporosis (weakening of your bones), pneumonia, low magnesium, restless legs, Cdiff (infection that causes diarrhea), DEMENTIA and most recently kidney damage / disease / insufficiency.     Due to this information we want to try to stop the PPI but if you try to stop it abruptly this can cause rebound acid and worsening symptoms.   So this is how we want you to get off the PPI:  - Start taking the nexium/protonix/prilosec/PPI  every other day with  zantac (ranitidine) 2 x a day for 2-4 weeks  - then decrease the PPI to every 3 days while taking the zantac (ranitidine) twice a day the other  days for 2-4  Weeks  - then you can try the zantac (ranitidine) once at night or up to 2 x day as needed.  - you can continue on this once at night or stop all together  - Avoid alcohol, spicy foods, NSAIDS (aleve, ibuprofen) at this time. See foods below.   +++++++++++++++++++++++++++++++++++++++++++  Food Choices for Gastroesophageal Reflux Disease  When you have gastroesophageal reflux disease (GERD), the foods you eat and your eating habits are very important. Choosing the right foods can help ease the discomfort of GERD. WHAT GENERAL GUIDELINES DO I NEED TO FOLLOW?  Choose fruits, vegetables, whole grains, low-fat dairy products, and low-fat meat, fish, and poultry.  Limit fats such as oils, salad dressings, butter, nuts, and avocado.  Keep a food diary to identify foods that cause symptoms.  Avoid foods that cause reflux. These may be different for different people.  Eat frequent small meals instead of three large meals each day.  Eat your meals slowly, in a relaxed setting.  Limit fried foods.  Cook foods  using methods other than frying.  Avoid drinking alcohol.  Avoid drinking large amounts of liquids with your meals.  Avoid bending over or lying down until 2-3 hours after eating.   WHAT FOODS ARE NOT RECOMMENDED? The following are some foods and drinks that may worsen your symptoms:  Vegetables Tomatoes. Tomato juice. Tomato and spaghetti sauce. Chili peppers. Onion and garlic. Horseradish. Fruits Oranges, grapefruit, and lemon (fruit and juice). Meats High-fat meats, fish, and poultry. This includes hot dogs, ribs, ham, sausage, salami, and bacon. Dairy Whole milk and chocolate milk. Sour cream. Cream. Butter. Ice cream. Cream cheese.  Beverages Coffee and tea, with or without caffeine. Carbonated beverages or energy drinks. Condiments Hot sauce. Barbecue sauce.  Sweets/Desserts Chocolate and cocoa. Donuts. Peppermint and spearmint. Fats and Oils High-fat foods, including French fries and potato chips. Other Vinegar. Strong spices, such as black pepper, white pepper, red pepper, cayenne, curry powder, cloves, ginger, and chili powder. Nexium/protonix/prilosec are called PPI's, they are great at healing your stomach but should only be taken for a short period of time.    

## 2014-08-31 NOTE — Progress Notes (Signed)
Patient ID: Mindy Richard, female   DOB: [birth year], 59 y.o.   MRN: [id number] Assessment and Plan:  Hypertension:  -Continue medication -monitor blood pressure at home. -Continue DASH diet -Reminder to go to the ER if any CP, SOB, nausea, dizziness, severe HA, changes vision/speech, left arm numbness and tingling and jaw pain.  Cholesterol - Continue diet and exercise -Check cholesterol.   Diabetes without complications -Continue diet and exercise.  -Check A1C  Vitamin D Def -check level -continue medications.   Depression -followed by Dr. Robina Ade  Memory loss -b12 -Iron panel  Continue diet and meds as discussed. Further disposition pending results of labs. Discussed med's effects and SE's.    HPI 59 y.o. female  presents for 3 month follow up with hypertension, hyperlipidemia, diabetes and vitamin D deficiency.   Her blood pressure has been controlled at home, today their BP is BP: 118/72 mmHg.She does not workout. She denies chest pain, shortness of breath, dizziness.   She is on cholesterol medication and denies myalgias. Her cholesterol is at goal. The cholesterol was:  05/24/2014: Cholesterol, Total 198; HDL-C 72; LDL (calc) 88; Triglycerides 189*   She has been working on diet and exercise for diabetes without complications, she is on bASA, she is on ACE/ARB, and denies  foot ulcerations, hyperglycemia, hypoglycemia , increased appetite, nausea, paresthesia of the feet, polydipsia, polyuria, visual disturbances, vomiting and weight loss. Last A1C was: 05/24/2014: Hemoglobin-A1c 6.8*Patient is concerned that her A1C will be up because since coming off her combiglyze her appetite has increased significantly and she has been eating poorly.     Patient is on Vitamin D supplement. 05/24/2014: VITD 44   She does reports some memory loss.  She thinks that this may be a residual of the [place].  She reports that she cannot take trazedone.  She reports that she and Dr. Robina Ade have talked  about these.  She reports mostly it is worse with stress and with strangers.    Current Medications:  Current Outpatient Prescriptions on File Prior to Visit  Medication Sig Dispense Refill  . acetaminophen (TYLENOL) 500 MG tablet Take 1,000 mg by mouth every 6 (six) hours as needed. For pain    . B-D ULTRAFINE III SHORT PEN 31G X 8 MM MISC USE AS DIRECTED  WITH  LANTUS  INJECTIONS 90 each 3  . buPROPion (WELLBUTRIN XL) 300 MG 24 hr tablet     . cholecalciferol (VITAMIN D) 1000 UNITS tablet Take 2,000 Units by mouth daily.     . diclofenac sodium (VOLTAREN) 1 % GEL Apply 2 g topically 4 (four) times daily. 100 g 2  . glipiZIDE (GLUCOTROL) 5 MG tablet TAKE 1 TABLET (5 MG TOTAL) BY MOUTH DAILY BEFORE BREAKFAST. 30 tablet 0  . LANTUS SOLOSTAR 100 UNIT/ML Solostar Pen     . losartan (COZAAR) 100 MG tablet Take 1/2 to 1 tablet daily as directed for BP & Kidney Protection 90 tablet 99  . metFORMIN (GLUCOPHAGE XR) 500 MG 24 hr tablet Take 1 to 2 tablets 2 x day after a meal for diabetes and use Immodium if needed for diarrhea. 360 tablet 99  . omeprazole (PRILOSEC) 40 MG capsule Take 1 capsule (40 mg total) by mouth daily. 90 capsule PRN  . pravastatin (PRAVACHOL) 40 MG tablet Take 1 tablet (40 mg total) by mouth daily. 90 tablet PRN  . ranitidine (ZANTAC) 300 MG tablet Take 300 mg by mouth at bedtime. prn    . venlafaxine XR (EFFEXOR-XR)  75 MG 24 hr capsule     . ZETIA 10 MG tablet TAKE 1 TABLET EVERY DAY 90 tablet 3  . zolpidem (AMBIEN) 10 MG tablet Take 10 mg by mouth at bedtime.      No current facility-administered medications on file prior to visit.   Medical History:  Past Medical History  Diagnosis Date  . Hypertension   . Hypercholesteremia   . GERD (gastroesophageal reflux disease)   . Mental disorder   . Anxiety   . Depression   . Chest pain 04/2011    saw Dr Ron Parker..not heart related  sent to GI dr. and found gallstones.  . Complication of anesthesia 2001; 20012    "slow to wake  up"  . Type II diabetes mellitus 2003  . H/O hiatal hernia    Allergies:  Allergies  Allergen Reactions  . Codeine Palpitations and Other (See Comments)    Makes her head feel "weird"  . Glucophage [Metformin Hcl] Diarrhea  . Nsaids Other (See Comments)    "upset stomach"     Review of Systems:  Review of Systems  Constitutional: Negative for fever, chills and malaise/fatigue.  HENT: Negative for congestion, sore throat and tinnitus.   Eyes: Negative.   Respiratory: Negative for cough, shortness of breath and wheezing.   Cardiovascular: Negative for chest pain, palpitations and leg swelling.  Gastrointestinal: Positive for heartburn. Negative for nausea, vomiting, diarrhea, constipation, blood in stool and melena.  Genitourinary: Negative for dysuria, urgency, frequency and hematuria.  Neurological: Positive for sensory change (big toes). Negative for dizziness, focal weakness, loss of consciousness and headaches.  Psychiatric/Behavioral: Positive for memory loss. The patient has insomnia.     Family history- Review and unchanged  Social history- Review and unchanged  Physical Exam: BP 118/72 mmHg  Pulse 100  Temp(Src) 97.7 F (36.5 C)  Resp 16  Ht 5\' 4"  (1.626 m)  Wt 152 lb (68.947 kg)  BMI 26.08 kg/m2 Wt Readings from Last 3 Encounters:  08/31/14 152 lb (68.947 kg)  05/24/14 152 lb 9.6 oz (69.219 kg)  02/15/14 153 lb (69.4 kg)   General Appearance: Well nourished well developed, non-toxic appearing, in no apparent distress. Eyes: PERRLA, EOMs, conjunctiva no swelling or erythema ENT/Mouth: Ear canals clear with no erythema, swelling, or discharge.  TMs normal bilaterally, oropharynx clear, moist, with no exudate.   Neck: Supple, thyroid normal, no JVD, no cervical adenopathy.  Respiratory: Respiratory effort normal, breath sounds clear A&P, no wheeze, rhonchi or rales noted.  No retractions, no accessory muscle usage Cardio: RRR with no MRGs. No noted edema.   Abdomen: Soft, + BS.  Non tender, no guarding, rebound, hernias, masses. Musculoskeletal: Full ROM, 5/5 strength, Normal gait Skin: Warm, dry without rashes, lesions, ecchymosis.  Neuro: Awake and oriented X 3, Cranial nerves intact. No cerebellar symptoms.  Psych: normal affect, Insight and Judgment appropriate.    FORCUCCI, Yoselin Amerman, PA-C 3:27 PM [facility]

## 2014-09-01 ENCOUNTER — Other Ambulatory Visit: Payer: Self-pay | Admitting: Internal Medicine

## 2014-09-01 LAB — BASIC METABOLIC PANEL WITH GFR
BUN: 10 mg/dL (ref 6–23)
CHLORIDE: 97 meq/L (ref 96–112)
CO2: 25 meq/L (ref 19–32)
CREATININE: 0.73 mg/dL (ref 0.50–1.10)
Calcium: 9.4 mg/dL (ref 8.4–10.5)
GFR, Est African American: >89 mL/min
Glucose, Bld: 179 mg/dL — ABNORMAL HIGH (ref 70–99)
Potassium: 4.4 mEq/L (ref 3.5–5.3)
Sodium: 137 mEq/L (ref 135–145)

## 2014-09-01 LAB — HEPATIC FUNCTION PANEL
ALK PHOS: 72 U/L (ref 39–117)
ALT: 13 U/L (ref 0–35)
AST: 13 U/L (ref 0–37)
Albumin: 4.2 g/dL (ref 3.5–5.2)
Bilirubin, Direct: 0.1 mg/dL (ref 0.0–0.3)
Indirect Bilirubin: 0.3 mg/dL (ref 0.2–1.2)
TOTAL PROTEIN: 7.2 g/dL (ref 6.0–8.3)
Total Bilirubin: 0.4 mg/dL (ref 0.2–1.2)

## 2014-09-01 LAB — CBC WITH DIFFERENTIAL/PLATELET
BASOS ABS: 0.1 10*3/uL (ref 0.0–0.1)
Basophils Relative: 1 % (ref 0–1)
EOS PCT: 6 % — AB (ref 0–5)
Eosinophils Absolute: 0.5 10*3/uL (ref 0.0–0.7)
HEMATOCRIT: 37.9 % (ref 36.0–46.0)
Hemoglobin: 13 g/dL (ref 12.0–15.0)
Lymphocytes Relative: 33 % (ref 12–46)
Lymphs Abs: 2.5 10*3/uL (ref 0.7–4.0)
MCH: 28.9 pg (ref 26.0–34.0)
MCHC: 34.3 g/dL (ref 30.0–36.0)
MCV: 84.2 fL (ref 78.0–100.0)
MONO ABS: 0.5 10*3/uL (ref 0.1–1.0)
MONOS PCT: 7 % (ref 3–12)
MPV: 9.7 fL (ref 8.6–12.4)
NEUTROS ABS: 4 10*3/uL (ref 1.7–7.7)
Neutrophils Relative %: 53 % (ref 43–77)
Platelets: 366 10*3/uL (ref 150–400)
RBC: 4.5 MIL/uL (ref 3.87–5.11)
RDW: 13.8 % (ref 11.5–15.5)
WBC: 7.6 10*3/uL (ref 4.0–10.5)

## 2014-09-01 LAB — LIPID PANEL
CHOLESTEROL: 169 mg/dL (ref 0–200)
HDL: 62 mg/dL (ref >=46)
LDL Cholesterol: 74 mg/dL (ref 0–99)
Total CHOL/HDL Ratio: 2.7 Ratio
Triglycerides: 166 mg/dL — ABNORMAL HIGH (ref <150)
VLDL: 33 mg/dL (ref 0–40)

## 2014-09-01 LAB — HEMOGLOBIN A1C
Hgb A1c MFr Bld: 7.4 % — ABNORMAL HIGH (ref <5.7)
Mean Plasma Glucose: 166 mg/dL — ABNORMAL HIGH (ref <117)

## 2014-09-01 LAB — IRON AND TIBC
%SAT: 16 % — AB (ref 20–55)
Iron: 78 ug/dL (ref 42–145)
TIBC: 486 ug/dL — ABNORMAL HIGH (ref 250–470)
UIBC: 408 ug/dL — ABNORMAL HIGH (ref 125–400)

## 2014-09-01 LAB — MAGNESIUM: Magnesium: 2 mg/dL (ref 1.5–2.5)

## 2014-09-01 LAB — VITAMIN B12: Vitamin B-12: 278 pg/mL (ref 211–911)

## 2014-09-01 LAB — TSH: TSH: 1.246 u[IU]/mL (ref 0.350–4.500)

## 2014-09-01 LAB — INSULIN, RANDOM: Insulin: 39.5 u[IU]/mL — ABNORMAL HIGH (ref 2.0–19.6)

## 2014-09-01 MED ORDER — SAXAGLIPTIN-METFORMIN ER 2.5-1000 MG PO TB24: 1.0000 | ORAL_TABLET | Freq: Two times a day (BID) | ORAL | Status: DC

## 2014-09-03 LAB — VITAMIN D 1,25 DIHYDROXY
Vitamin D 1, 25 (OH)2 Total: 57 pg/mL (ref 18–72)
Vitamin D2 1, 25 (OH)2: <8 pg/mL
Vitamin D3 1, 25 (OH)2: 57 pg/mL

## 2014-09-04 ENCOUNTER — Encounter: Payer: Self-pay | Admitting: Internal Medicine

## 2014-09-11 ENCOUNTER — Other Ambulatory Visit: Payer: Self-pay | Admitting: Internal Medicine

## 2014-09-21 ENCOUNTER — Other Ambulatory Visit: Payer: Self-pay | Admitting: *Deleted

## 2014-09-21 ENCOUNTER — Encounter: Payer: Self-pay | Admitting: Internal Medicine

## 2014-09-21 MED ORDER — SAXAGLIPTIN-METFORMIN ER 2.5-1000 MG PO TB24: 1.0000 | ORAL_TABLET | Freq: Two times a day (BID) | ORAL | Status: DC

## 2014-10-12 ENCOUNTER — Encounter: Payer: Self-pay | Admitting: Internal Medicine

## 2014-11-13 ENCOUNTER — Other Ambulatory Visit: Payer: Self-pay

## 2014-12-11 ENCOUNTER — Other Ambulatory Visit: Payer: Self-pay | Admitting: *Deleted

## 2014-12-11 MED ORDER — INSULIN GLARGINE 100 UNIT/ML SOLOSTAR PEN: PEN_INJECTOR | SUBCUTANEOUS | Status: DC

## 2015-01-11 ENCOUNTER — Ambulatory Visit (INDEPENDENT_AMBULATORY_CARE_PROVIDER_SITE_OTHER): Payer: Commercial Managed Care - HMO | Admitting: Internal Medicine

## 2015-01-11 ENCOUNTER — Encounter: Payer: Self-pay | Admitting: Internal Medicine

## 2015-01-11 VITALS — BP 120/76 | HR 80 | Temp 97.6°F | Resp 16 | Ht 63.75 in | Wt 150.4 lb

## 2015-01-11 DIAGNOSIS — K2100 Gastro-esophageal reflux disease with esophagitis, without bleeding: Secondary | ICD-10-CM

## 2015-01-11 DIAGNOSIS — E119 Type 2 diabetes mellitus without complications: Secondary | ICD-10-CM | POA: Diagnosis not present

## 2015-01-11 DIAGNOSIS — E782 Mixed hyperlipidemia: Secondary | ICD-10-CM | POA: Diagnosis not present

## 2015-01-11 DIAGNOSIS — I10 Essential (primary) hypertension: Secondary | ICD-10-CM | POA: Diagnosis not present

## 2015-01-11 DIAGNOSIS — F329 Major depressive disorder, single episode, unspecified: Secondary | ICD-10-CM | POA: Diagnosis not present

## 2015-01-11 DIAGNOSIS — Z79899 Other long term (current) drug therapy: Secondary | ICD-10-CM

## 2015-01-11 DIAGNOSIS — E559 Vitamin D deficiency, unspecified: Secondary | ICD-10-CM | POA: Diagnosis not present

## 2015-01-11 DIAGNOSIS — Z1212 Encounter for screening for malignant neoplasm of rectum: Secondary | ICD-10-CM

## 2015-01-11 DIAGNOSIS — Z794 Long term (current) use of insulin: Secondary | ICD-10-CM | POA: Diagnosis not present

## 2015-01-11 DIAGNOSIS — Z1389 Encounter for screening for other disorder: Secondary | ICD-10-CM

## 2015-01-11 DIAGNOSIS — Z1331 Encounter for screening for depression: Secondary | ICD-10-CM

## 2015-01-11 DIAGNOSIS — F32A Depression, unspecified: Secondary | ICD-10-CM

## 2015-01-11 DIAGNOSIS — Z0000 Encounter for general adult medical examination without abnormal findings: Secondary | ICD-10-CM | POA: Insufficient documentation

## 2015-01-11 LAB — LIPID PANEL
CHOL/HDL RATIO: 3 ratio (ref <=5.0)
CHOLESTEROL: 186 mg/dL (ref 125–200)
HDL: 61 mg/dL (ref >=46)
LDL Cholesterol: 90 mg/dL (ref <130)
Triglycerides: 175 mg/dL — ABNORMAL HIGH (ref <150)
VLDL: 35 mg/dL — ABNORMAL HIGH (ref <30)

## 2015-01-11 LAB — CBC WITH DIFFERENTIAL/PLATELET
BASOS PCT: 1 % (ref 0–1)
Basophils Absolute: 0.1 10*3/uL (ref 0.0–0.1)
EOS ABS: 0.5 10*3/uL (ref 0.0–0.7)
Eosinophils Relative: 6 % — ABNORMAL HIGH (ref 0–5)
HCT: 38.8 % (ref 36.0–46.0)
Hemoglobin: 12.9 g/dL (ref 12.0–15.0)
Lymphocytes Relative: 36 % (ref 12–46)
Lymphs Abs: 2.8 10*3/uL (ref 0.7–4.0)
MCH: 28.2 pg (ref 26.0–34.0)
MCHC: 33.2 g/dL (ref 30.0–36.0)
MCV: 84.7 fL (ref 78.0–100.0)
MONO ABS: 0.5 10*3/uL (ref 0.1–1.0)
MONOS PCT: 7 % (ref 3–12)
MPV: 9.2 fL (ref 8.6–12.4)
Neutro Abs: 3.9 10*3/uL (ref 1.7–7.7)
Neutrophils Relative %: 50 % (ref 43–77)
PLATELETS: 346 10*3/uL (ref 150–400)
RBC: 4.58 MIL/uL (ref 3.87–5.11)
RDW: 14 % (ref 11.5–15.5)
WBC: 7.7 10*3/uL (ref 4.0–10.5)

## 2015-01-11 LAB — BASIC METABOLIC PANEL WITH GFR
BUN: 9 mg/dL (ref 7–25)
CALCIUM: 9.2 mg/dL (ref 8.6–10.4)
CO2: 24 mmol/L (ref 20–31)
CREATININE: 0.74 mg/dL (ref 0.50–1.05)
Chloride: 100 mmol/L (ref 98–110)
GFR, Est Non African American: >89 mL/min (ref >=60)
Glucose, Bld: 196 mg/dL — ABNORMAL HIGH (ref 65–99)
Potassium: 4.2 mmol/L (ref 3.5–5.3)
SODIUM: 138 mmol/L (ref 135–146)

## 2015-01-11 LAB — HEPATIC FUNCTION PANEL
ALT: 13 U/L (ref 6–29)
AST: 14 U/L (ref 10–35)
Albumin: 4.2 g/dL (ref 3.6–5.1)
Alkaline Phosphatase: 76 U/L (ref 33–130)
BILIRUBIN DIRECT: 0.1 mg/dL (ref <=0.2)
BILIRUBIN TOTAL: 0.4 mg/dL (ref 0.2–1.2)
Indirect Bilirubin: 0.3 mg/dL (ref 0.2–1.2)
Total Protein: 6.9 g/dL (ref 6.1–8.1)

## 2015-01-11 LAB — TSH: TSH: 1.574 u[IU]/mL (ref 0.350–4.500)

## 2015-01-11 LAB — MAGNESIUM: MAGNESIUM: 2 mg/dL (ref 1.5–2.5)

## 2015-01-11 NOTE — Progress Notes (Signed)
Patient ID: MONITA SWIER, female   DOB: 09-20-1955, 59 y.o.   MRN: [id number]   Comprehensive Examination  This very nice 59 y.o. MWF presents for complete physical.  Patient has been followed for HTN, T2_NIDDM , Hyperlipidemia, and Vitamin D Deficiency. Patient also is on SS Disability for now controlled depression. Patient has hx/o OSA and was intolerant to her CPAP mask and after losing 25-30# , she feels  She no longer snores nor has apneic spells.     HTN predates since  2000. Patient's BP has been controlled at home.Today's BP: 120/76 mmHg. Patient denies any cardiac symptoms as chest pain, palpitations, shortness of breath, dizziness or ankle swelling.   Patient's hyperlipidemia is controlled with diet and medications. Patient denies myalgias or other medication SE's. Last lipids were at goal -  Cholesterol 169; HDL 62; LDL 74; Triglycerides 166 on 08/31/2014.   Patient has Insulin Dependent  T2_ DM since 2001 and patient denies reactive hypoglycemic symptoms, visual blurring, diabetic polys or paresthesias. Last A1c was 7.4% on 08/31/2014.   Finally, patient has history of Vitamin D Deficiency of "7" in 2008 and last vitamin D was 44 on 05/24/2014.     Medication Sig  . acetaminophen (TYLENOL) 500 MG tablet Take 1,000 mg by mouth every 6 (six) hours as needed. For pain  . B-D ULTRAFINE III SHORT PEN 31G X 8 MM MISC USE AS DIRECTED  WITH  LANTUS  INJECTIONS  . buPROPion (WELLBUTRIN XL) 300 MG 24 hr tablet   . cholecalciferol (VITAMIN D) 1000 UNITS tablet Take 2,000 Units by mouth daily.   . diclofenac sodium (VOLTAREN) 1 % GEL Apply 2 g topically 4 (four) times daily.  Marland Kitchen glipiZIDE (GLUCOTROL) 5 MG tablet TAKE 1 TABLET (5 MG TOTAL) BY MOUTH DAILY BEFORE BREAKFAST.  Marland Kitchen Insulin Glargine (LANTUS SOLOSTAR) 100 UNIT/ML Solostar Pen Inject 8 units daily AD or AD by PCP  . losartan (COZAAR) 100 MG tablet Take 1/2 to 1 tablet daily as directed for BP & Kidney Protection  . metFORMIN (GLUCOPHAGE XR)  500 MG 24 hr tablet Take 1 to 2 tablets 2 x day after a meal for diabetes and use Immodium if needed for diarrhea.  Marland Kitchen omeprazole (PRILOSEC) 40 MG capsule Take 1 capsule (40 mg total) by mouth daily.  . pravastatin (PRAVACHOL) 40 MG tablet Take 1 tablet (40 mg total) by mouth daily.  . ranitidine (ZANTAC) 300 MG tablet Take 300 mg by mouth at bedtime. prn  . Saxagliptin-Metformin 2.09-998 MG TB24 Take 1 tablet by mouth 2 (two) times daily with a meal.  . venlafaxine XR (EFFEXOR-XR) 75 MG 24 hr capsule   . ZETIA 10 MG tablet TAKE 1 TABLET EVERY DAY  . zolpidem (AMBIEN) 10 MG tablet Take 10 mg by mouth at bedtime.     Allergies  Allergen Reactions  . Codeine Palpitations and Other (See Comments)    Makes her head feel "weird"  . Glucophage [Metformin Hcl] Diarrhea  . Nsaids Other (See Comments)    "upset stomach"   Past Medical History  Diagnosis Date  . Hypertension   . Hypercholesteremia   . GERD (gastroesophageal reflux disease)   . Mental disorder   . Anxiety   . Depression   . Chest pain 04/2011    saw Dr Ron Parker..not heart related  sent to GI dr. and found gallstones.  . Complication of anesthesia 2001; 20012    "slow to wake up"  . Type II diabetes mellitus 2003  .  H/O hiatal hernia    Health Maintenance  Topic Date Due  . TETANUS/TDAP  02/28/1975  . MAMMOGRAM  02/27/2006  . COLONOSCOPY  02/27/2006  . PNEUMOCOCCAL POLYSACCHARIDE VACCINE (2) 01/18/2011  . URINE MICROALBUMIN  10/12/2014  . INFLUENZA VACCINE  12/18/2014  . OPHTHALMOLOGY EXAM  01/11/2015  . FOOT EXAM  02/16/2015  . HEMOGLOBIN A1C  03/02/2015  . Hepatitis C Screening  Completed  . HIV Screening  Completed   Immunization History  Administered Date(s) Administered  . DT 08/10/2013  . Influenza Split 04/04/2013, 04/03/2014  . Pneumococcal-Unspecified 01/17/2006   Past Surgical History  Procedure Laterality Date  . Shoulder arthroscopy w/ rotator cuff repair  2012    left  . Cesarean section  1985  .  Cholecystectomy  09/02/11  . Carpal tunnel release  2001    right  . Tonsillectomy  ~ 1970  . Dilation and curettage of uterus  1984  . Tubal ligation  1985?  Marland Kitchen Colonoscopy    . Cholecystectomy  09/02/2011    Procedure: LAPAROSCOPIC CHOLECYSTECTOMY WITH INTRAOPERATIVE CHOLANGIOGRAM;  Surgeon: Haywood Lasso, MD;  Location: [place];  Service: General;  Laterality: N/A;  . Ercp  09/03/2011    Procedure: ENDOSCOPIC RETROGRADE CHOLANGIOPANCREATOGRAPHY (ERCP);  Surgeon: Missy Sabins, MD;  Location: [facility] ENDOSCOPY;  Service: Endoscopy;  Laterality: N/A;  sphincterotomy and stone extraction   Family History  Problem Relation Age of Onset  . Cancer Maternal Aunt     breast  . Anesthesia problems Neg Hx   . Hypotension Neg Hx   . Heart attack Father 87    Smoker  . Diabetes Mother   . Hyperlipidemia Mother    Social History   Social History  . Marital Status: Married    Spouse Name: N/A  . Number of Children: 1  . Years of Education: N/A   Occupational History  . Housewife    Social History Main Topics  . Smoking status: Never Smoker   . Smokeless tobacco: Never Used  . Alcohol Use: No  . Drug Use: No  . Sexual Activity: Not Currently   Other Topics Concern  . Not on file   Social History Narrative    ROS Constitutional: Denies fever, chills, weight loss/gain, headaches, insomnia,  night sweats or change in appetite. Does c/o fatigue. Eyes: Denies redness, blurred vision, diplopia, discharge, itchy or watery eyes.  ENT: Denies discharge, congestion, post nasal drip, epistaxis, sore throat, earache, hearing loss, dental pain, Tinnitus, Vertigo, Sinus pain or snoring.  Cardio: Denies chest pain, palpitations, irregular heartbeat, syncope, dyspnea, diaphoresis, orthopnea, PND, claudication or edema Respiratory: denies cough, dyspnea, DOE, pleurisy, hoarseness, laryngitis or wheezing.  Gastrointestinal: Denies dysphagia, heartburn, reflux, water brash, pain, cramps, nausea,  vomiting, bloating, diarrhea, constipation, hematemesis, melena, hematochezia, jaundice or hemorrhoids Genitourinary: Denies dysuria, frequency, urgency, nocturia, hesitancy, discharge, hematuria or flank pain Musculoskeletal: Denies arthralgia, myalgia, stiffness, Jt. Swelling, pain, limp or strain/sprain. Denies Falls. Skin: Denies puritis, rash, hives, warts, acne, eczema or change in skin lesion Neuro: No weakness, tremor, incoordination, spasms, paresthesia or pain Psychiatric: Denies confusion, memory loss or sensory loss. Denies Depression. Endocrine: Denies change in weight, skin, hair change, nocturia, and paresthesia, diabetic polys, visual blurring or hyper / hypo glycemic episodes.  Heme/Lymph: No excessive bleeding, bruising or enlarged lymph nodes.  Physical Exam  BP 120/76 mmHg  Pulse 80  Temp(Src) 97.6 F (36.4 C)  Resp 16  Ht 5' 3.75" (1.619 m)  Wt 150 lb 6.4 oz (68.221  kg)  BMI 26.03 kg/m2  General Appearance: Well nourished, in no apparent distress. Eyes: PERRLA, EOMs, conjunctiva no swelling or erythema, normal fundi and vessels. Sinuses: No frontal/maxillary tenderness ENT/Mouth: EACs patent / TMs  nl. Nares clear without erythema, swelling, mucoid exudates. Oral hygiene is good. No erythema, swelling, or exudate. Tongue normal, non-obstructing. Tonsils not swollen or erythematous. Hearing normal.  Neck: Supple, thyroid normal. No bruits, nodes or JVD. Respiratory: Respiratory effort normal.  BS equal and clear bilateral without rales, rhonci, wheezing or stridor. Cardio: Heart sounds are normal with regular rate and rhythm and no murmurs, rubs or gallops. Peripheral pulses are normal and equal bilaterally without edema. No aortic or femoral bruits. Chest: symmetric with normal excursions and percussion.  Abdomen: Flat, soft, with bowel sounds. Nontender, no guarding, rebound, hernias, masses, or organomegaly.  Lymphatics: Non tender without lymphadenopathy.   Genitourinary: No hernias.Testes nl. DRE - prostate nl for age - smooth & firm w/o nodules. Musculoskeletal: Full ROM all peripheral extremities, joint stability, 5/5 strength, and normal gait. Skin: Warm and dry without rashes, lesions, cyanosis, clubbing or  ecchymosis.  Neuro: Cranial nerves intact, reflexes equal bilaterally. Normal muscle tone, no cerebellar symptoms. Sensation intact by monofilament testing to the toes bilaterally.  Pysch: Awake and oriented X 3 with normal affect, insight and judgment appropriate.   Assessment and Plan  1. Essential hypertension  - EKG 12-Lead - [place], RETROPERITNL ABD,  LTD - TSH  2. Insulin-requiring  II diabetes mellitus  - Microalbumin / creatinine urine ratio - HM DIABETES FOOT EXAM - LOW EXTREMITY NEUR EXAM DOCUM - Hemoglobin A1c  3. Hyperlipidemia  - Lipid panel  4. Vitamin D deficiency  - Vit D  25 hydroxy (rtn osteoporosis monitoring)  5. GERD   6. Depression, controlled   7. Screening for rectal cancer  - POC Hemoccult Bld/Stl (3-Cd Home Screen); Future  8. Depression screen   9. Medication management  - Urine Microscopic - CBC with Differential/Platelet - BASIC METABOLIC PANEL WITH GFR - Hepatic function panel - Magnesium   Continue prudent diet as discussed, weight control, BP monitoring, regular exercise, and medications as discussed.  Discussed med effects and SE's. Routine screening labs and tests as requested with regular follow-up as recommended.  Over 40 minutes of exam, counseling &  chart review was performed

## 2015-01-11 NOTE — Patient Instructions (Signed)

## 2015-01-12 LAB — URINALYSIS, MICROSCOPIC ONLY
Bacteria, UA: NONE SEEN [HPF]
Casts: NONE SEEN [LPF]
RBC / HPF: NONE SEEN RBC/HPF (ref <=2)
WBC, UA: NONE SEEN WBC/HPF (ref <=5)
Yeast: NONE SEEN [HPF]

## 2015-01-12 LAB — MICROALBUMIN / CREATININE URINE RATIO
CREATININE, URINE: 171.3 mg/dL
MICROALB/CREAT RATIO: 5.8 mg/g (ref 0.0–30.0)
Microalb, Ur: 1 mg/dL (ref <2.0)

## 2015-01-12 LAB — HEMOGLOBIN A1C
HEMOGLOBIN A1C: 7.2 % — AB (ref <5.7)
Mean Plasma Glucose: 160 mg/dL — ABNORMAL HIGH (ref <117)

## 2015-01-12 LAB — VITAMIN D 25 HYDROXY (VIT D DEFICIENCY, FRACTURES): Vit D, 25-Hydroxy: 41 ng/mL (ref 30–100)

## 2015-02-07 DIAGNOSIS — H43313 Vitreous membranes and strands, bilateral: Secondary | ICD-10-CM | POA: Diagnosis not present

## 2015-02-07 DIAGNOSIS — H5213 Myopia, bilateral: Secondary | ICD-10-CM | POA: Diagnosis not present

## 2015-02-07 DIAGNOSIS — H5210 Myopia, unspecified eye: Secondary | ICD-10-CM | POA: Diagnosis not present

## 2015-03-08 ENCOUNTER — Ambulatory Visit (INDEPENDENT_AMBULATORY_CARE_PROVIDER_SITE_OTHER): Payer: Commercial Managed Care - HMO

## 2015-03-08 DIAGNOSIS — Z23 Encounter for immunization: Secondary | ICD-10-CM

## 2015-03-13 ENCOUNTER — Other Ambulatory Visit: Payer: Self-pay | Admitting: *Deleted

## 2015-03-13 MED ORDER — GLIPIZIDE 5 MG PO TABS: ORAL_TABLET | ORAL | Status: DC

## 2015-04-16 ENCOUNTER — Other Ambulatory Visit: Payer: Self-pay

## 2015-04-16 ENCOUNTER — Encounter: Payer: Self-pay | Admitting: Physician Assistant

## 2015-04-16 ENCOUNTER — Ambulatory Visit (INDEPENDENT_AMBULATORY_CARE_PROVIDER_SITE_OTHER): Payer: Commercial Managed Care - HMO | Admitting: Physician Assistant

## 2015-04-16 VITALS — BP 108/60 | HR 88 | Temp 97.7°F | Resp 16 | Ht 63.75 in | Wt 153.0 lb

## 2015-04-16 DIAGNOSIS — F325 Major depressive disorder, single episode, in full remission: Secondary | ICD-10-CM

## 2015-04-16 DIAGNOSIS — E559 Vitamin D deficiency, unspecified: Secondary | ICD-10-CM

## 2015-04-16 DIAGNOSIS — E782 Mixed hyperlipidemia: Secondary | ICD-10-CM | POA: Diagnosis not present

## 2015-04-16 DIAGNOSIS — Z0001 Encounter for general adult medical examination with abnormal findings: Secondary | ICD-10-CM

## 2015-04-16 DIAGNOSIS — I10 Essential (primary) hypertension: Secondary | ICD-10-CM | POA: Diagnosis not present

## 2015-04-16 DIAGNOSIS — E1142 Type 2 diabetes mellitus with diabetic polyneuropathy: Secondary | ICD-10-CM | POA: Diagnosis not present

## 2015-04-16 DIAGNOSIS — R6889 Other general symptoms and signs: Secondary | ICD-10-CM | POA: Diagnosis not present

## 2015-04-16 DIAGNOSIS — K2100 Gastro-esophageal reflux disease with esophagitis, without bleeding: Secondary | ICD-10-CM

## 2015-04-16 DIAGNOSIS — G4733 Obstructive sleep apnea (adult) (pediatric): Secondary | ICD-10-CM | POA: Diagnosis not present

## 2015-04-16 DIAGNOSIS — E1140 Type 2 diabetes mellitus with diabetic neuropathy, unspecified: Secondary | ICD-10-CM | POA: Insufficient documentation

## 2015-04-16 DIAGNOSIS — E119 Type 2 diabetes mellitus without complications: Secondary | ICD-10-CM

## 2015-04-16 DIAGNOSIS — Z794 Long term (current) use of insulin: Secondary | ICD-10-CM | POA: Diagnosis not present

## 2015-04-16 DIAGNOSIS — Z79899 Other long term (current) drug therapy: Secondary | ICD-10-CM | POA: Diagnosis not present

## 2015-04-16 DIAGNOSIS — Z0000 Encounter for general adult medical examination without abnormal findings: Secondary | ICD-10-CM

## 2015-04-16 NOTE — Progress Notes (Signed)
MEDICARE ANNUAL WELLNESS VISIT AND FOLLOW UP  Assessment:   1. Essential hypertension - continue medications, DASH diet, exercise and monitor at home. Call if greater than 130/80.  - CBC with Differential/Platelet - BASIC METABOLIC PANEL WITH GFR - Hepatic function panel - TSH  2. Insulin-requiring or dependent type II diabetes mellitus ([place]) Discussed general issues about diabetes pathophysiology and management., Educational material distributed., Suggested low cholesterol diet., Encouraged aerobic exercise., Discussed foot care., Reminded to get yearly retinal exam. - Hemoglobin A1c  3. Diabetic polyneuropathy associated with type 2 diabetes mellitus ([place]) Discussed general issues about diabetes pathophysiology and management., Educational material distributed., Suggested low cholesterol diet., Encouraged aerobic exercise., Discussed foot care., Reminded to get yearly retinal exam.  4. Hyperlipidemia Has been off zetia due to cost.  - Lipid panel  5. Depression, major, in remission [facility]) Follows Dr. Toy Care, remission  6. Medicare annual wellness visit, initial Need to get information about MGM and PAP from Dr. Marvel Plan  7. GERD Continue PPI/H2 blocker, diet discussed  8. Vitamin D deficiency - VITAMIN D 25 Hydroxy (Vit-D Deficiency, Fractures)  9. Medication management - Magnesium  10. OSA (obstructive sleep apnea) Need re titration of machine from [facility] in [place], may be contributing to memory issues/insomnia.     Plan:   During the course of the visit the patient was educated and counseled about appropriate screening and preventive services including:    Pneumococcal vaccine   Influenza vaccine  Td vaccine  Screening electrocardiogram  Screening mammography  Bone densitometry screening  Colorectal cancer screening  Diabetes screening  Glaucoma screening  Nutrition counseling   Advanced directives: given info/requested  Conditions/risks  identified: BMI: Discussed weight loss, diet, and increase physical activity.  Increase physical activity: AHA recommends 150 minutes of physical activity a week.  Medications reviewed DEXA- not indicated Diabetes is not at goal, ACE/ARB therapy: Yes. started this visit Urinary Incontinence is an issue: discussed non pharmacology and pharmacology options.  Fall risk: low- discussed PT, home fall assessment, medications.    Subjective:   Mindy Richard is a 59 y.o. female who presents for Medicare Annual Wellness Visit and 3 month follow up on hypertension, diabetes, hyperlipidemia, vitamin D def.  Date of last medicare wellness visit was 01/2014  59 y.o. female  presents for 3 month follow up with hypertension, hyperlipidemia, diabetes and vitamin D. Her blood pressure has been controlled at home, today it is: BP: 108/60 mmHg  She does not workout. She denies chest pain, shortness of breath, dizziness.  She is on cholesterol medication and denies myalgias. Her cholesterol is at goal. The cholesterol last visit was:   Lab Results  Component Value Date   CHOL 186 01/11/2015   HDL 61 01/11/2015   LDLCALC 90 01/11/2015   TRIG 175* 01/11/2015   CHOLHDL 3.0 01/11/2015   She has been working on diet and exercise for Diabetes, she checks her sugars and it has been running 120's, she is on 10 units of the lantus and denies polydipsia, polyuria and visual disturbances. She has bilateral toe numbness, no ulcers. She is not on ACE/ARB.  Last A1C in the office was:  Lab Results  Component Value Date   HGBA1C 7.2* 01/11/2015    Lab Results  Component Value Date   GFRNONAA >89 01/11/2015   Patient is on Vitamin D supplement. Lab Results  Component Value Date   VD25OH 41 01/11/2015   She complains of memory issues, has CPAP but does not wear it,  was set at 11 but states it is too much. Would like to titrate to lower.  She is on wellbutrin and effexor for anxiety/depression, has increased  stress due to son's husband in hospice in [place].    Names of Other Physician/Practitioners you currently use: 1. [facility]- here for primary care 2. Eye doctor Dr. Marvel Plan- 12/2014 3. Dentist Dr. Vanessa Kick Patient Care Team: Unk Pinto, MD as PCP - General (Internal Medicine) Teena Irani, MD as Consulting Physician (Gastroenterology) Neldon Mc, MD as Consulting Physician (General Surgery) Alden Hipp, MD as Consulting Physician (Obstetrics and Gynecology)  Medication Review Current Outpatient Prescriptions on File Prior to Visit  Medication Sig Dispense Refill  . acetaminophen (TYLENOL) 500 MG tablet Take 1,000 mg by mouth every 6 (six) hours as needed. For pain    . B-D ULTRAFINE III SHORT PEN 31G X 8 MM MISC USE AS DIRECTED  WITH  LANTUS  INJECTIONS 90 each 3  . buPROPion (WELLBUTRIN XL) 300 MG 24 hr tablet     . cholecalciferol (VITAMIN D) 1000 UNITS tablet Take 2,000 Units by mouth daily.     . diclofenac sodium (VOLTAREN) 1 % GEL Apply 2 g topically 4 (four) times daily. 100 g 2  . glipiZIDE (GLUCOTROL) 5 MG tablet TAKE 1 TABLET (5 MG TOTAL) BY MOUTH DAILY BEFORE BREAKFAST. 90 tablet 1  . Insulin Glargine (LANTUS SOLOSTAR) 100 UNIT/ML Solostar Pen Inject 8 units daily AD or AD by PCP 45 mL 2  . losartan (COZAAR) 100 MG tablet Take 1/2 to 1 tablet daily as directed for BP & Kidney Protection 90 tablet 99  . omeprazole (PRILOSEC) 40 MG capsule Take 1 capsule (40 mg total) by mouth daily. 90 capsule PRN  . pravastatin (PRAVACHOL) 40 MG tablet Take 1 tablet (40 mg total) by mouth daily. 90 tablet PRN  . ranitidine (ZANTAC) 300 MG tablet Take 300 mg by mouth at bedtime. prn    . Saxagliptin-Metformin 2.09-998 MG TB24 Take 1 tablet by mouth 2 (two) times daily with a meal. 180 tablet 3  . venlafaxine XR (EFFEXOR-XR) 75 MG 24 hr capsule     . zolpidem (AMBIEN) 10 MG tablet Take 10 mg by mouth at bedtime.     Marland Kitchen ZETIA 10 MG tablet TAKE 1  TABLET EVERY DAY (Patient not taking: Reported on 04/16/2015) 90 tablet 3   No current facility-administered medications on file prior to visit.    Current Problems (verified) Patient Active Problem List   Diagnosis Date Noted  . Medicare annual wellness visit, initial 01/11/2015  . Insulin-requiring or dependent type II diabetes mellitus ([place]) 08/10/2013  . Vitamin D deficiency 04/03/2013  . Medication management 04/03/2013  . Hyperlipidemia 04/03/2013  . Hypertension 05/09/2011  . Depression, controlled 05/09/2011  . GERD 05/09/2011    Screening Tests Health Maintenance  Topic Date Due  . TETANUS/TDAP  02/28/1975  . PAP SMEAR  02/27/1977  . MAMMOGRAM  02/27/2006  . COLONOSCOPY  02/27/2006  . PNEUMOCOCCAL POLYSACCHARIDE VACCINE (2) 01/18/2011  . OPHTHALMOLOGY EXAM  01/11/2015  . HEMOGLOBIN A1C  07/14/2015  . INFLUENZA VACCINE  12/18/2015  . FOOT EXAM  01/11/2016  . URINE MICROALBUMIN  01/11/2016  . Hepatitis C Screening  Completed  . HIV Screening  Completed     Immunization History  Administered Date(s) Administered  . DT 08/10/2013  . Influenza Split 04/04/2013, 04/03/2014, 03/08/2015  . Pneumococcal-Unspecified 01/17/2006    Preventative care: Last colonoscopy: 2012 due 2022 Last mammogram:  Dr. Delfino Lovett Last pap smear/pelvic exam: Dr. Delfino Lovett DEXA:N/A 01/2012 Myoview stress test  Prior vaccinations: TD or Tdap: 2015  Influenza: 2016 Pneumococcal: 2007 Prevnar 13: N/A Shingles/Zostavax: declines  Allergies Allergies  Allergen Reactions  . Codeine Palpitations and Other (See Comments)    Makes her head feel "weird"  . Glucophage [Metformin Hcl] Diarrhea  . Nsaids Other (See Comments)    "upset stomach"   Surgical history Past Surgical History  Procedure Laterality Date  . Shoulder arthroscopy w/ rotator cuff repair  2012    left  . Cesarean section  1985  . Cholecystectomy  09/02/11  . Carpal tunnel release  2001    right  . Tonsillectomy  ~  1970  . Dilation and curettage of uterus  1984  . Tubal ligation  1985?  Marland Kitchen Colonoscopy    . Cholecystectomy  09/02/2011    Procedure: LAPAROSCOPIC CHOLECYSTECTOMY WITH INTRAOPERATIVE CHOLANGIOGRAM;  Surgeon: Haywood Lasso, MD;  Location: [place];  Service: General;  Laterality: N/A;  . Ercp  09/03/2011    Procedure: ENDOSCOPIC RETROGRADE CHOLANGIOPANCREATOGRAPHY (ERCP);  Surgeon: Missy Sabins, MD;  Location: [facility] ENDOSCOPY;  Service: Endoscopy;  Laterality: N/A;  sphincterotomy and stone extraction   Family history Family History  Problem Relation Age of Onset  . Cancer Maternal Aunt     breast  . Anesthesia problems Neg Hx   . Hypotension Neg Hx   . Heart attack Father 65    Smoker  . Diabetes Mother   . Hyperlipidemia Mother     Tobacco Social History  Substance Use Topics  . Smoking status: Never Smoker   . Smokeless tobacco: Never Used  . Alcohol Use: No   MEDICARE WELLNESS OBJECTIVES: Tobacco use: She does not smoke.  Patient is not a former smoker. If yes, counseling given Alcohol Current alcohol use: none Osteoporosis: postmenopausal estrogen deficiency and dietary calcium and/or vitamin D deficiency, History of fracture in the past year: no Fall risk: Low Risk Diet: in general, a "healthy" diet   Physical activity: Current Exercise Habits:: The patient does not participate in regular exercise at present Cardiac risk factors: Cardiac Risk Factors include: advanced age (>45men, >34 women);diabetes mellitus;dyslipidemia;hypertension;sedentary lifestyle Depression/mood screen:   Depression screen [facility] 2/9 04/16/2015  Decreased Interest 0  Down, Depressed, Hopeless 0  PHQ - 2 Score 0    ADLs:  In your present state of health, do you have any difficulty performing the following activities: 04/16/2015  Hearing? N  Vision? Y  Difficulty concentrating or making decisions? Y  Walking or climbing stairs? N  Dressing or bathing? N  Doing errands, shopping? N  Preparing  Food and eating ? N  Using the Toilet? N  In the past six months, have you accidently leaked urine? N  Do you have problems with loss of bowel control? N  Managing your Medications? N  Managing your Finances? N  Housekeeping or managing your Housekeeping? N    Cognitive Testing  Alert? Yes  Normal Appearance?Yes  Oriented to person? Yes  Place? Yes   Time? Yes  Recall of three objects?  Yes  Can perform simple calculations? Yes  Displays appropriate judgment?Yes  Can read the correct time from a watch face?Yes  EOL planning: Does patient have an advance directive?: Yes Type of Advance Directive: Healthcare Power of Attorney, Living will Does patient want to make changes to advanced directive?: No - Patient declined Copy of advanced directive(s) in chart?: No - copy requested   Objective:  Blood pressure 108/60, pulse 88, temperature 97.7 F (36.5 C), temperature source Temporal, resp. rate 16, height 5' 3.75" (1.619 m), weight 153 lb (69.4 kg), SpO2 96 %. Body mass index is 26.48 kg/(m^2).  General appearance: alert, no distress, WD/WN,  female HEENT: normocephalic, sclerae anicteric, TMs pearly, nares patent, no discharge or erythema, pharynx normal Oral cavity: MMM, no lesions Neck: supple, no lymphadenopathy, no thyromegaly, no masses Heart: RRR, normal S1, S2, no murmurs Lungs: CTA bilaterally, no wheezes, rhonchi, or rales Abdomen: +bs, soft, non tender, non distended, no masses, no hepatomegaly, no splenomegaly Musculoskeletal: nontender, no swelling, no obvious deformity Extremities: no edema, no cyanosis, no clubbing Pulses: 2+ symmetric, upper and lower extremities, normal cap refill Neurological: alert, oriented x 3, CN2-12 intact, strength normal upper extremities and lower extremities, sensation decreased bilateral feet, DTRs 2+ throughout, no cerebellar signs, gait normal Psychiatric: normal affect, behavior normal, pleasant  Breast: defer Gyn: defer Rectal:  defer  Medicare Attestation I have personally reviewed: The patient's medical and social history Their use of alcohol, tobacco or illicit drugs Their current medications and supplements The patient's functional ability including ADLs,fall risks, home safety risks, cognitive, and hearing and visual impairment Diet and physical activities Evidence for depression or mood disorders  The patient's weight, height, BMI, and visual acuity have been recorded in the chart.  I have made referrals, counseling, and provided education to the patient based on review of the above and I have provided the patient with a written personalized care plan for preventive services.     Vicie Mutters, PA-C   04/16/2015

## 2015-04-16 NOTE — Patient Instructions (Addendum)
I think it is possible that you have sleep apnea. It can cause interrupted sleep, headaches, frequent awakenings, fatigue, dry mouth, fast/slow heart beats, memory issues, anxiety/depression, swelling, numbness tingling hands/feet, weight gain, shortness of breath, and the list goes on. Sleep apnea needs to be ruled out because if it is left untreated it does eventually lead to abnormal heart beats, lung failure or heart failure as well as increasing the risk of heart attack and stroke. There are masks you can wear OR a mouth piece that I can give you information about. Often times though people feel MUCH better after getting treatment.   Sleep Apnea  Sleep apnea is a sleep disorder characterized by abnormal pauses in breathing while you sleep. When your breathing pauses, the level of oxygen in your blood decreases. This causes you to move out of deep sleep and into light sleep. As a result, your quality of sleep is poor, and the system that carries your blood throughout your body (cardiovascular system) experiences stress. If sleep apnea remains untreated, the following conditions can develop:  High blood pressure (hypertension).  Coronary artery disease.  Inability to achieve or maintain an erection (impotence).  Impairment of your thought process (cognitive dysfunction). There are three types of sleep apnea: 1. Obstructive sleep apnea--Pauses in breathing during sleep because of a blocked airway. 2. Central sleep apnea--Pauses in breathing during sleep because the area of the brain that controls your breathing does not send the correct signals to the muscles that control breathing. 3. Mixed sleep apnea--A combination of both obstructive and central sleep apnea.  RISK FACTORS The following risk factors can increase your risk of developing sleep apnea:  Being overweight.  Smoking.  Having narrow passages in your nose and throat.  Being of older age.  Being female.  Alcohol use.   Sedative and tranquilizer use.  Ethnicity. Among individuals younger than 35 years, African Americans are at increased risk of sleep apnea. SYMPTOMS   Difficulty staying asleep.  Daytime sleepiness and fatigue.  Loss of energy.  Irritability.  Loud, heavy snoring.  Morning headaches.  Trouble concentrating.  Forgetfulness.  Decreased interest in sex. DIAGNOSIS  In order to diagnose sleep apnea, your caregiver will perform a physical examination. Your caregiver may suggest that you take a home sleep test. Your caregiver may also recommend that you spend the night in a sleep lab. In the sleep lab, several monitors record information about your heart, lungs, and brain while you sleep. Your leg and arm movements and blood oxygen level are also recorded. TREATMENT The following actions may help to resolve mild sleep apnea:  Sleeping on your side.   Using a decongestant if you have nasal congestion.   Avoiding the use of depressants, including alcohol, sedatives, and narcotics.   Losing weight and modifying your diet if you are overweight. There also are devices and treatments to help open your airway:  Oral appliances. These are custom-made mouthpieces that shift your lower jaw forward and slightly open your bite. This opens your airway.  Devices that create positive airway pressure. This positive pressure "splints" your airway open to help you breathe better during sleep. The following devices create positive airway pressure:  Continuous positive airway pressure (CPAP) device. The CPAP device creates a continuous level of air pressure with an air pump. The air is delivered to your airway through a mask while you sleep. This continuous pressure keeps your airway open.  Nasal expiratory positive airway pressure (EPAP) device. The EPAP device  creates positive air pressure as you exhale. The device consists of single-use valves, which are inserted into each nostril and held in  place by adhesive. The valves create very little resistance when you inhale but create much more resistance when you exhale. That increased resistance creates the positive airway pressure. This positive pressure while you exhale keeps your airway open, making it easier to breath when you inhale again.  Bilevel positive airway pressure (BPAP) device. The BPAP device is used mainly in patients with central sleep apnea. This device is similar to the CPAP device because it also uses an air pump to deliver continuous air pressure through a mask. However, with the BPAP machine, the pressure is set at two different levels. The pressure when you exhale is lower than the pressure when you inhale.  Surgery. Typically, surgery is only done if you cannot comply with less invasive treatments or if the less invasive treatments do not improve your condition. Surgery involves removing excess tissue in your airway to create a wider passage way. Document Released: 04/25/2002 Document Revised: 08/30/2012 Document Reviewed: 09/11/2011 [facility] Patient Information 2015 [place], [place]. This information is not intended to replace advice given to you by your health care provider. Make sure you discuss any questions you have with your health care provider.    Diabetes is a very complicated disease...lets simplify it.  An easy way to look at it to understand the complications is if you think of the extra sugar floating in your blood stream as glass shards floating through your blood stream.    Diabetes affects your small vessels first: 1) The glass shards (sugar) scraps down the tiny blood vessels in your eyes and lead to diabetic retinopathy, the leading cause of blindness in the [place]. Diabetes is the leading cause of newly diagnosed adult (67 to 59 years of age) blindness in the [place].  2) The glass shards scratches down the tiny vessels of your legs leading to nerve damage called neuropathy and can lead to  amputations of your feet. More than 60% of all non-traumatic amputations of lower limbs occur in people with diabetes.  3) Over time the small vessels in your brain are shredded and closed off, individually this does not cause any problems but over a long period of time many of the small vessels being blocked can lead to Vascular Dementia.   4) Your kidney's are a filter system and have a "net" that keeps certain things in the body and lets bad things out. Sugar shreds this net and leads to kidney damage and eventually failure. Decreasing the sugar that is destroying the net and certain blood pressure medications can help stop or decrease progression of kidney disease. Diabetes was the primary cause of kidney failure in 44 percent of all new cases in 2011.  5) Diabetes also destroys the small vessels in your penis that lead to erectile dysfunction. Eventually the vessels are so damaged that you may not be responsive to cialis or viagra.   Diabetes and your large vessels: Your larger vessels consist of your coronary arteries in your heart and the carotid vessels to your brain. Diabetes or even increased sugars put you at 300% increased risk of heart attack and stroke and this is why.. The sugar scrapes down your large blood vessels and your body sees this as an internal injury and tries to repair itself. Just like you get a scab on your skin, your platelets will stick to the blood vessel wall trying  to heal it. This is why we have diabetics on low dose aspirin daily, this prevents the platelets from sticking and can prevent plaque formation. In addition, your body takes cholesterol and tries to shove it into the open wound. This is why we want your LDL, or bad cholesterol, below 70.   The combination of platelets and cholesterol over 5-10 years forms plaque that can break off and cause a heart attack or stroke.   PLEASE REMEMBER:  Diabetes is preventable! Up to 37 percent of complications and  morbidities among individuals with type 2 diabetes can be prevented, delayed, or effectively treated and minimized with regular visits to a health professional, appropriate monitoring and medication, and a healthy diet and lifestyle.     Bad carbs also include fruit juice, alcohol, and sweet tea. These are empty calories that do not signal to your brain that you are full.   Please remember the good carbs are still carbs which convert into sugar. So please measure them out no more than 1/2-1 cup of rice, oatmeal, pasta, and beans  Veggies are however free foods! Pile them on.   Not all fruit is created equal. Please see the list below, the fruit at the bottom is higher in sugars than the fruit at the top. Please avoid all dried fruits.

## 2015-04-17 LAB — BASIC METABOLIC PANEL WITH GFR
BUN: 9 mg/dL (ref 7–25)
CALCIUM: 9.3 mg/dL (ref 8.6–10.4)
CHLORIDE: 100 mmol/L (ref 98–110)
CO2: 26 mmol/L (ref 20–31)
CREATININE: 0.58 mg/dL (ref 0.50–1.05)
GFR, Est African American: >89 mL/min (ref >=60)
GFR, Est Non African American: >89 mL/min (ref >=60)
Glucose, Bld: 131 mg/dL — ABNORMAL HIGH (ref 65–99)
Potassium: 4.4 mmol/L (ref 3.5–5.3)
SODIUM: 138 mmol/L (ref 135–146)

## 2015-04-17 LAB — HEPATIC FUNCTION PANEL
ALBUMIN: 3.9 g/dL (ref 3.6–5.1)
ALT: 13 U/L (ref 6–29)
AST: 13 U/L (ref 10–35)
Alkaline Phosphatase: 77 U/L (ref 33–130)
Total Bilirubin: 0.3 mg/dL (ref 0.2–1.2)
Total Protein: 7 g/dL (ref 6.1–8.1)

## 2015-04-17 LAB — LIPID PANEL
CHOL/HDL RATIO: 3.2 ratio (ref <=5.0)
CHOLESTEROL: 194 mg/dL (ref 125–200)
HDL: 60 mg/dL (ref >=46)
LDL Cholesterol: 88 mg/dL (ref <130)
Triglycerides: 229 mg/dL — ABNORMAL HIGH (ref <150)
VLDL: 46 mg/dL — ABNORMAL HIGH (ref <30)

## 2015-04-17 LAB — CBC WITH DIFFERENTIAL/PLATELET
BASOS PCT: 1 % (ref 0–1)
Basophils Absolute: 0.1 10*3/uL (ref 0.0–0.1)
EOS ABS: 0.5 10*3/uL (ref 0.0–0.7)
Eosinophils Relative: 7 % — ABNORMAL HIGH (ref 0–5)
HCT: 36.6 % (ref 36.0–46.0)
HEMOGLOBIN: 12.5 g/dL (ref 12.0–15.0)
LYMPHS ABS: 2.4 10*3/uL (ref 0.7–4.0)
Lymphocytes Relative: 32 % (ref 12–46)
MCH: 28.5 pg (ref 26.0–34.0)
MCHC: 34.2 g/dL (ref 30.0–36.0)
MCV: 83.4 fL (ref 78.0–100.0)
MONO ABS: 0.6 10*3/uL (ref 0.1–1.0)
MONOS PCT: 8 % (ref 3–12)
MPV: 9.4 fL (ref 8.6–12.4)
Neutro Abs: 3.9 10*3/uL (ref 1.7–7.7)
Neutrophils Relative %: 52 % (ref 43–77)
PLATELETS: 391 10*3/uL (ref 150–400)
RBC: 4.39 MIL/uL (ref 3.87–5.11)
RDW: 14 % (ref 11.5–15.5)
WBC: 7.5 10*3/uL (ref 4.0–10.5)

## 2015-04-17 LAB — HEMOGLOBIN A1C
Hgb A1c MFr Bld: 6.8 % — ABNORMAL HIGH (ref <5.7)
Mean Plasma Glucose: 148 mg/dL — ABNORMAL HIGH (ref <117)

## 2015-04-17 LAB — VITAMIN D 25 HYDROXY (VIT D DEFICIENCY, FRACTURES): Vit D, 25-Hydroxy: 54 ng/mL (ref 30–100)

## 2015-04-17 LAB — TSH: TSH: 1.179 u[IU]/mL (ref 0.350–4.500)

## 2015-04-17 LAB — MAGNESIUM: Magnesium: 2 mg/dL (ref 1.5–2.5)

## 2015-04-20 NOTE — Addendum Note (Signed)
Addended by: Vicie Mutters R on: 04/20/2015 12:56 PM   Modules accepted: Orders

## 2015-05-08 NOTE — Addendum Note (Signed)
Addended by: Vicie Mutters R on: 05/08/2015 12:42 PM   Modules accepted: Orders

## 2015-05-20 HISTORY — PX: CARPAL TUNNEL RELEASE: SHX101

## 2015-05-27 ENCOUNTER — Other Ambulatory Visit: Payer: Self-pay | Admitting: Internal Medicine

## 2015-06-04 ENCOUNTER — Ambulatory Visit (INDEPENDENT_AMBULATORY_CARE_PROVIDER_SITE_OTHER): Payer: Commercial Managed Care - HMO | Admitting: Physician Assistant

## 2015-06-04 ENCOUNTER — Encounter: Payer: Self-pay | Admitting: Physician Assistant

## 2015-06-04 VITALS — BP 110/76 | HR 90 | Temp 97.7°F | Resp 16 | Ht 63.75 in | Wt 153.0 lb

## 2015-06-04 DIAGNOSIS — Z79899 Other long term (current) drug therapy: Secondary | ICD-10-CM

## 2015-06-04 DIAGNOSIS — K2100 Gastro-esophageal reflux disease with esophagitis, without bleeding: Secondary | ICD-10-CM

## 2015-06-04 DIAGNOSIS — E119 Type 2 diabetes mellitus without complications: Secondary | ICD-10-CM | POA: Diagnosis not present

## 2015-06-04 DIAGNOSIS — E782 Mixed hyperlipidemia: Secondary | ICD-10-CM | POA: Diagnosis not present

## 2015-06-04 DIAGNOSIS — I10 Essential (primary) hypertension: Secondary | ICD-10-CM

## 2015-06-04 DIAGNOSIS — R6889 Other general symptoms and signs: Secondary | ICD-10-CM | POA: Diagnosis not present

## 2015-06-04 DIAGNOSIS — Z0001 Encounter for general adult medical examination with abnormal findings: Secondary | ICD-10-CM

## 2015-06-04 DIAGNOSIS — E1142 Type 2 diabetes mellitus with diabetic polyneuropathy: Secondary | ICD-10-CM

## 2015-06-04 DIAGNOSIS — E559 Vitamin D deficiency, unspecified: Secondary | ICD-10-CM | POA: Diagnosis not present

## 2015-06-04 DIAGNOSIS — G4733 Obstructive sleep apnea (adult) (pediatric): Secondary | ICD-10-CM | POA: Diagnosis not present

## 2015-06-04 DIAGNOSIS — Z0000 Encounter for general adult medical examination without abnormal findings: Secondary | ICD-10-CM

## 2015-06-04 DIAGNOSIS — F325 Major depressive disorder, single episode, in full remission: Secondary | ICD-10-CM

## 2015-06-04 DIAGNOSIS — R1013 Epigastric pain: Secondary | ICD-10-CM

## 2015-06-04 DIAGNOSIS — Z794 Long term (current) use of insulin: Secondary | ICD-10-CM

## 2015-06-04 LAB — CBC WITH DIFFERENTIAL/PLATELET
Basophils Absolute: 0.1 10*3/uL (ref 0.0–0.1)
Basophils Relative: 1 % (ref 0–1)
Eosinophils Absolute: 0.3 10*3/uL (ref 0.0–0.7)
Eosinophils Relative: 4 % (ref 0–5)
HCT: 37.9 % (ref 36.0–46.0)
Hemoglobin: 12.6 g/dL (ref 12.0–15.0)
Lymphocytes Relative: 32 % (ref 12–46)
Lymphs Abs: 2.6 10*3/uL (ref 0.7–4.0)
MCH: 27.9 pg (ref 26.0–34.0)
MCHC: 33.2 g/dL (ref 30.0–36.0)
MCV: 84 fL (ref 78.0–100.0)
MPV: 9.5 fL (ref 8.6–12.4)
Monocytes Absolute: 0.6 10*3/uL (ref 0.1–1.0)
Monocytes Relative: 7 % (ref 3–12)
Neutro Abs: 4.5 10*3/uL (ref 1.7–7.7)
Neutrophils Relative %: 56 % (ref 43–77)
Platelets: 382 10*3/uL (ref 150–400)
RBC: 4.51 MIL/uL (ref 3.87–5.11)
RDW: 13.8 % (ref 11.5–15.5)
WBC: 8 10*3/uL (ref 4.0–10.5)

## 2015-06-04 NOTE — Progress Notes (Signed)
MEDICARE ANNUAL WELLNESS VISIT AND FOLLOW UP  Assessment:   1. Essential hypertension - continue medications, DASH diet, exercise and monitor at home. Call if greater than 130/80.  - CBC with Differential/Platelet - BASIC METABOLIC PANEL WITH GFR - Hepatic function panel - TSH  2. Insulin-requiring or dependent type II diabetes mellitus ([place]) Discussed general issues about diabetes pathophysiology and management., Educational material distributed., Suggested low cholesterol diet., Encouraged aerobic exercise., Discussed foot care., Reminded to get yearly retinal exam. - Hemoglobin A1c  3. Diabetic polyneuropathy associated with type 2 diabetes mellitus ([place]) Discussed general issues about diabetes pathophysiology and management., Educational material distributed., Suggested low cholesterol diet., Encouraged aerobic exercise., Discussed foot care., Reminded to get yearly retinal exam.  4. Hyperlipidemia - on pravastatin - Lipid panel  5. Depression, major, in remission [facility]) Follows Dr. Toy Care, remission  6. Medicare annual wellness visit, initial Need to get information about MGM and PAP from Dr. Marvel Plan  7. GERD Will check minimal labs, dexilant samples x 10 days, then back to prilosec, if not better may need gastric emptying study for possible gastroparesis versus sending to GI for EGD.   8. Vitamin D deficiency - VITAMIN D 25 Hydroxy (Vit-D Deficiency, Fractures)  9. Medication management - Magnesium  10. OSA (obstructive sleep apnea)  11. Abdominal pain, epigastric Will check minimal labs, dexilant samples x 10 days, then back to prilosec, if not better may need gastric emptying study for possible gastroparesis versus sending to GI for EGD.  - CBC with Differential/Platelet - BASIC METABOLIC PANEL WITH GFR - Hepatic function panel - Amylase      Plan:   During the course of the visit the patient was educated and counseled about appropriate screening and  preventive services including:    Pneumococcal vaccine   Influenza vaccine  Td vaccine  Screening electrocardiogram  Screening mammography  Bone densitometry screening  Colorectal cancer screening  Diabetes screening  Glaucoma screening  Nutrition counseling   Advanced directives: given info/requested  Conditions/risks identified: BMI: Discussed weight loss, diet, and increase physical activity.  Increase physical activity: AHA recommends 150 minutes of physical activity a week.  Medications reviewed DEXA- not indicated Diabetes is not at goal, ACE/ARB therapy: Yes.  Urinary Incontinence is an issue: discussed non pharmacology and pharmacology options.  Fall risk: low- discussed PT, home fall assessment, medications.    Subjective:   Mindy Richard is a 60 y.o. female who presents for Medicare Annual Wellness Visit and AB pain Date of last medicare wellness visit was 11//2016  60 y.o. female  presents AB pain.  She states that she has been having GERD despite being on prilosec and has been taking Tums. She states Thursday night after garlic parm chicken, had severe epigastric stabbing pain, took 4 tums without help. S/p choley. Denies N,V, dark black stool/blood in stool, constipation. Has diarrhea frequently. Denies quick fullness. Denies NSAID or ETOH use.   Her blood pressure has been controlled at home, today it is: BP: 110/76 mmHg  She does not workout. She denies chest pain, shortness of breath, dizziness.  She is on cholesterol medication and denies myalgias. Her cholesterol is at goal. The cholesterol last visit was:   Lab Results  Component Value Date   CHOL 194 04/16/2015   HDL 60 04/16/2015   LDLCALC 88 04/16/2015   TRIG 229* 04/16/2015   CHOLHDL 3.2 04/16/2015   She has been working on diet and exercise for Diabetes, she checks her sugars and it has  been running 120's, she is on 10 units of the lantus and denies polydipsia, polyuria and visual  disturbances. She has bilateral toe numbness, no ulcers. She is not on ACE/ARB.  Last A1C in the office was:  Lab Results  Component Value Date   HGBA1C 6.8* 04/16/2015    Lab Results  Component Value Date   GFRNONAA >89 04/16/2015   Patient is on Vitamin D supplement. Lab Results  Component Value Date   VD25OH 54 04/16/2015   Has OSA, not on CPAP.  She is on wellbutrin and effexor for anxiety/depression.    Names of Other Physician/Practitioners you currently use: 1. [facility]- here for primary care 2. Eye doctor Dr. Marvel Plan- 12/2014 3. Dentist Dr. Vanessa Kick Patient Care Team: Unk Pinto, MD as PCP - General (Internal Medicine) Teena Irani, MD as Consulting Physician (Gastroenterology) Neldon Mc, MD as Consulting Physician (General Surgery) [place] as Respiratory Therapist [facility]) Alden Hipp, MD as Consulting Physician (Obstetrics and Gynecology)  Medication Review Current Outpatient Prescriptions on File Prior to Visit  Medication Sig Dispense Refill  . acetaminophen (TYLENOL) 500 MG tablet Take 1,000 mg by mouth every 6 (six) hours as needed. For pain    . B-D ULTRAFINE III SHORT PEN 31G X 8 MM MISC USE AS DIRECTED  WITH  LANTUS  INJECTIONS 90 each 3  . buPROPion (WELLBUTRIN XL) 300 MG 24 hr tablet     . cholecalciferol (VITAMIN D) 1000 UNITS tablet Take 2,000 Units by mouth daily.     . diclofenac sodium (VOLTAREN) 1 % GEL Apply 2 g topically 4 (four) times daily. 100 g 2  . glipiZIDE (GLUCOTROL) 5 MG tablet TAKE 1 TABLET (5 MG TOTAL) BY MOUTH DAILY BEFORE BREAKFAST. 90 tablet 1  . Insulin Glargine (LANTUS SOLOSTAR) 100 UNIT/ML Solostar Pen Inject 8 units daily AD or AD by PCP 45 mL 2  . losartan (COZAAR) 100 MG tablet Take 1/2 to 1 tablet daily as directed for BP & Kidney Protection 90 tablet 99  . omeprazole (PRILOSEC) 40 MG capsule TAKE 1 CAPSULE EVERY DAY 90 capsule 1  .  pravastatin (PRAVACHOL) 40 MG tablet TAKE 1 TABLET EVERY DAY 90 tablet 1  . ranitidine (ZANTAC) 300 MG tablet Take 300 mg by mouth at bedtime. prn    . Saxagliptin-Metformin 2.09-998 MG TB24 Take 1 tablet by mouth 2 (two) times daily with a meal. 180 tablet 3  . venlafaxine XR (EFFEXOR-XR) 75 MG 24 hr capsule     . Vitamins A & D 5000-400 UNITS CAPS     . ZETIA 10 MG tablet TAKE 1 TABLET EVERY DAY 90 tablet 3  . zolpidem (AMBIEN) 10 MG tablet Take 10 mg by mouth at bedtime.      No current facility-administered medications on file prior to visit.    Current Problems (verified) Patient Active Problem List   Diagnosis Date Noted  . Neuropathy, diabetic ([place]) 04/16/2015  . Medicare annual wellness visit, initial 01/11/2015  . Insulin-requiring or dependent type II diabetes mellitus ([place]) 08/10/2013  . Vitamin D deficiency 04/03/2013  . Medication management 04/03/2013  . Hyperlipidemia 04/03/2013  . Hypertension 05/09/2011  . Depression, major, in remission ([place]) 05/09/2011  . GERD 05/09/2011    Screening Tests Health Maintenance  Topic Date Due  . TETANUS/TDAP  02/28/1975  . PAP SMEAR  02/27/1977  . MAMMOGRAM  02/27/2006  . PNEUMOCOCCAL POLYSACCHARIDE VACCINE (2) 01/18/2011  . HEMOGLOBIN A1C  10/14/2015  .  INFLUENZA VACCINE  12/18/2015  . OPHTHALMOLOGY EXAM  01/03/2016  . FOOT EXAM  04/15/2016  . COLONOSCOPY  03/06/2021  . Hepatitis C Screening  Completed  . HIV Screening  Completed     Immunization History  Administered Date(s) Administered  . DT 08/10/2013  . Influenza Split 04/04/2013, 04/03/2014, 03/08/2015  . Pneumococcal-Unspecified 01/17/2006    Preventative care: Last colonoscopy: 2012 due 2022 Last mammogram: Dr. Delfino Lovett Last pap smear/pelvic exam: Dr. Delfino Lovett DEXA:N/A 01/2012 Myoview stress test  Prior vaccinations: TD or Tdap: 2015  Influenza: 2016 Pneumococcal: 2007 Prevnar 13: N/A Shingles/Zostavax: declines  Allergies Allergies  Allergen  Reactions  . Codeine Palpitations and Other (See Comments)    Makes her head feel "weird"  . Glucophage [Metformin Hcl] Diarrhea  . Nsaids Other (See Comments)    "upset stomach"   Surgical history Past Surgical History  Procedure Laterality Date  . Shoulder arthroscopy w/ rotator cuff repair  2012    left  . Cesarean section  1985  . Cholecystectomy  09/02/11  . Carpal tunnel release  2001    right  . Tonsillectomy  ~ 1970  . Dilation and curettage of uterus  1984  . Tubal ligation  1985?  [facility]land Kitchen Colonoscopy    . Cholecystectomy  09/02/2011    Procedure: LAPAROSCOPIC CHOLECYSTECTOMY WITH INTRAOPERATIVE CHOLANGIOGRAM;  Surgeon: Haywood Lasso, MD;  Location: [facility];  Service: General;  Laterality: N/A;  . Ercp  09/03/2011    Procedure: ENDOSCOPIC RETROGRADE CHOLANGIOPANCREATOGRAPHY (ERCP);  Surgeon: Missy Sabins, MD;  Location[facility] ENDOSCOPY;  Service: Endoscopy;  Laterality: N/A;  sphincterotomy and stone extraction   Family history Family History  Problem Relation Age of Onset  . Cancer Maternal Aunt     breast  . Anesthesia problems Neg Hx   . Hypotension Neg Hx   . Heart attack Father 58    Smoker  . Diabetes Mother   . Hyperlipidemia Mother     Tobacco Social History  Substance Use Topics  . Smoking status: Never Smoker   . Smokeless tobacco: Never Used  . Alcohol Use: No   MEDICARE WELLNESS OBJECTIVES: Tobacco use: She does not smoke.  Patient is not a former smoker. If yes, counseling given Alcohol Current alcohol use: none Osteoporosis: postmenopausal estrogen deficiency and dietary calcium and/or vitamin D deficiency, History of fracture in the past year: no Fall risk: Low Risk Diet: in general, a "healthy" diet   Physical activity: Current Exercise Habits:: The patient does not participate in regular exercise at present Cardiac risk factors: Cardiac Risk Factors include: advanced age (>1men, >58 women);diabetes mellitus;dyslipidemia;hypertension;sedentary  lifestyle Depression/mood screen:   Depression screen [facility] 2/9 06/04/2015  Decreased Interest 0  Down, Depressed, Hopeless 0  PHQ - 2 Score 0    ADLs:  In your present state of health, do you have any difficulty performing the following activities: 06/04/2015 04/16/2015  Hearing? N N  Vision? Y Y  Difficulty concentrating or making decisions? Tempie Donning  Walking or climbing stairs? N N  Dressing or bathing? N N  Doing errands, shopping? N N  Preparing Food and eating ? N N  Using the Toilet? N N  In the past six months, have you accidently leaked urine? N N  Do you have problems with loss of bowel control? N N  Managing your Medications? N N  Managing your Finances? N N  Housekeeping or managing your Housekeeping? N N    Cognitive Testing  Alert? Yes  Normal Appearance?Yes  Oriented to person? Yes  Place? Yes   Time? Yes  Recall of three objects?  Yes  Can perform simple calculations? Yes  Displays appropriate judgment?Yes  Can read the correct time from a watch face?Yes  EOL planning: Does patient have an advance directive?: Yes Type of Advance Directive: Living will Does patient want to make changes to advanced directive?: No - Patient declined Copy of advanced directive(s) in chart?: No - copy requested   Objective:   Blood pressure 110/76, pulse 90, temperature 97.7 F (36.5 C), temperature source Temporal, resp. rate 16, height 5' 3.75" (1.619 m), weight 153 lb (69.4 kg), SpO2 95 %. Body mass index is 26.48 kg/(m^2).  General appearance: alert, no distress, WD/WN,  female HEENT: normocephalic, sclerae anicteric, TMs pearly, nares patent, no discharge or erythema, pharynx normal Oral cavity: MMM, no lesions Neck: supple, no lymphadenopathy, no thyromegaly, no masses Heart: RRR, normal S1, S2, no murmurs Lungs: CTA bilaterally, no wheezes, rhonchi, or rales Abdomen: +bs, soft, mild epigastric tenderness, non distended, no masses, no hepatomegaly, no  splenomegaly Musculoskeletal: nontender, no swelling, no obvious deformity Extremities: no edema, no cyanosis, no clubbing Pulses: 2+ symmetric, upper and lower extremities, normal cap refill Neurological: alert, oriented x 3, CN2-12 intact, strength normal upper extremities and lower extremities, sensation normal bilateral feet, DTRs 2+ throughout, no cerebellar signs, gait normal Psychiatric: normal affect, behavior normal, pleasant  Breast: defer Gyn: defer Rectal: defer  Medicare Attestation I have personally reviewed: The patient's medical and social history Their use of alcohol, tobacco or illicit drugs Their current medications and supplements The patient's functional ability including ADLs,fall risks, home safety risks, cognitive, and hearing and visual impairment Diet and physical activities Evidence for depression or mood disorders  The patient's weight, height, BMI, and visual acuity have been recorded in the chart.  I have made referrals, counseling, and provided education to the patient based on review of the above and I have provided the patient with a written personalized care plan for preventive services.     Vicie Mutters, PA-C   06/04/2015

## 2015-06-04 NOTE — Patient Instructions (Signed)
Food Choices for Gastroesophageal Reflux Disease, Adult When you have gastroesophageal reflux disease (GERD), the foods you eat and your eating habits are very important. Choosing the right foods can help ease the discomfort of GERD. WHAT GENERAL GUIDELINES DO I NEED TO FOLLOW?  Choose fruits, vegetables, whole grains, low-fat dairy products, and low-fat meat, fish, and poultry.  Limit fats such as oils, salad dressings, butter, nuts, and avocado.  Keep a food diary to identify foods that cause symptoms.  Avoid foods that cause reflux. These may be different for different people.  Eat frequent small meals instead of three large meals each day.  Eat your meals slowly, in a relaxed setting.  Limit fried foods.  Cook foods using methods other than frying.  Avoid drinking alcohol.  Avoid drinking large amounts of liquids with your meals.  Avoid bending over or lying down until 2-3 hours after eating. WHAT FOODS ARE NOT RECOMMENDED? The following are some foods and drinks that may worsen your symptoms: Vegetables Tomatoes. Tomato juice. Tomato and spaghetti sauce. Chili peppers. Onion and garlic. Horseradish. Fruits Oranges, grapefruit, and lemon (fruit and juice). Meats High-fat meats, fish, and poultry. This includes hot dogs, ribs, ham, sausage, salami, and bacon. Dairy Whole milk and chocolate milk. Sour cream. Cream. Butter. Ice cream. Cream cheese.  Beverages Coffee and tea, with or without caffeine. Carbonated beverages or energy drinks. Condiments Hot sauce. Barbecue sauce.  Sweets/Desserts Chocolate and cocoa. Donuts. Peppermint and spearmint. Fats and Oils High-fat foods, including [place] fries and potato chips. Other Vinegar. Strong spices, such as black pepper, white pepper, red pepper, cayenne, curry powder, cloves, ginger, and chili powder. The items listed above may not be a complete list of foods and beverages to avoid. Contact your dietitian for more  information.   This information is not intended to replace advice given to you by your health care provider. Make sure you discuss any questions you have with your health care provider.   Document Released: 05/05/2005 Document Revised: 05/26/2014 Document Reviewed: 03/09/2013 Elsevier Interactive Patient Education 2016 Elsevier Inc.  Gastroparesis Gastroparesis, also called delayed gastric emptying, is a condition in which food takes longer than normal to empty from the stomach. The condition is usually long-lasting (chronic). CAUSES This condition may be caused by:  An endocrine disorder, such as hypothyroidism or diabetes. Diabetes is the most common cause of this condition.  A nervous system disease, such as Parkinson disease or multiple sclerosis.  Cancer, infection, or surgery of the stomach or vagus nerve.  A connective tissue disorder, such as scleroderma.  Certain medicines. In most cases, the cause is not known. RISK FACTORS This condition is more likely to develop in:  People with certain disorders, including endocrine disorders, eating disorders, amyloidosis, and scleroderma.  People with certain diseases, including Parkinson disease or multiple sclerosis.  People with cancer or infection of the stomach or vagus nerve.  People who have had surgery on the stomach or vagus nerve.  People who take certain medicines.  Women. SYMPTOMS Symptoms of this condition include:  An early feeling of fullness when eating.  Nausea.  Weight loss.  Vomiting.  Heartburn.  Abdominal bloating.  Inconsistent blood glucose levels.  Lack of appetite.  Acid from the stomach coming up into the esophagus (gastroesophageal reflux).  Spasms of the stomach. Symptoms may come and go. DIAGNOSIS This condition is diagnosed with tests, such as:  Tests that check how long it takes food to move through the stomach and intestines. These tests  include:  Upper gastrointestinal  (GI) series. In this test, X-rays of the intestines are taken after you drink a liquid. The liquid makes the intestines show up better on the X-rays.  Gastric emptying scintigraphy. In this test, scans are taken after you eat food that contains a small amount of radioactive material.  Wireless capsule GI monitoring system. This test involves swallowing a capsule that records information about movement through the stomach.  Gastric manometry. This test measures electrical and muscular activity in the stomach. It is done with a thin tube that is passed down the throat and into the stomach.  Endoscopy. This test checks for abnormalities in the lining of the stomach. It is done with a long, thin tube that is passed down the throat and into the stomach.  An ultrasound. This test can help rule out gallbladder disease or pancreatitis as a cause of your symptoms. It uses sound waves to take pictures of the inside of your body. TREATMENT There is no cure for gastroparesis. This condition may be managed with:  Treatment of the underlying condition causing the gastroparesis.  Lifestyle changes, including exercise and dietary changes. Dietary changes can include:  Changes in what and when you eat.  Eating smaller meals more often.  Eating low-fat foods.  Eating low-fiber forms of high-fiber foods, such as cooked vegetables instead of raw vegetables.  Having liquid foods in place of solid foods. Liquid foods are easier to digest.  Medicines. These may be given to control nausea and vomiting and to stimulate stomach muscles.  Getting food through a feeding tube. This may be done in severe cases.  A gastric neurostimulator. This is a device that is inserted into the body with surgery. It helps improve stomach emptying and control nausea and vomiting. HOME CARE INSTRUCTIONS  Follow your health care provider's instructions about exercise and diet.  Take medicines only as directed by your health  care provider. SEEK MEDICAL CARE IF:  Your symptoms do not improve with treatment.  You have new symptoms. SEEK IMMEDIATE MEDICAL CARE IF:  You have severe abdominal pain that does not improve with treatment.  You have nausea that does not go away.  You cannot keep fluids down.   This information is not intended to replace advice given to you by your health care provider. Make sure you discuss any questions you have with your health care provider.   Document Released: 05/05/2005 Document Revised: 09/19/2014 Document Reviewed: 05/01/2014 Elsevier Interactive Patient Education [employer].

## 2015-06-05 LAB — HEPATIC FUNCTION PANEL
ALBUMIN: 4.2 g/dL (ref 3.6–5.1)
ALK PHOS: 79 U/L (ref 33–130)
ALT: 13 U/L (ref 6–29)
AST: 14 U/L (ref 10–35)
Bilirubin, Direct: <0.1 mg/dL (ref <=0.2)
Total Bilirubin: 0.3 mg/dL (ref 0.2–1.2)
Total Protein: 6.9 g/dL (ref 6.1–8.1)

## 2015-06-05 LAB — BASIC METABOLIC PANEL WITH GFR
BUN: 11 mg/dL (ref 7–25)
CHLORIDE: 96 mmol/L — AB (ref 98–110)
CO2: 28 mmol/L (ref 20–31)
CREATININE: 0.68 mg/dL (ref 0.50–1.05)
Calcium: 9.5 mg/dL (ref 8.6–10.4)
GFR, Est African American: >89 mL/min (ref >=60)
GFR, Est Non African American: >89 mL/min (ref >=60)
Glucose, Bld: 190 mg/dL — ABNORMAL HIGH (ref 65–99)
POTASSIUM: 4.4 mmol/L (ref 3.5–5.3)
Sodium: 135 mmol/L (ref 135–146)

## 2015-06-05 LAB — AMYLASE: AMYLASE: 27 U/L (ref 0–105)

## 2015-06-23 ENCOUNTER — Other Ambulatory Visit: Payer: Self-pay | Admitting: Internal Medicine

## 2015-07-19 ENCOUNTER — Encounter: Payer: Self-pay | Admitting: Internal Medicine

## 2015-07-19 ENCOUNTER — Ambulatory Visit (INDEPENDENT_AMBULATORY_CARE_PROVIDER_SITE_OTHER): Payer: Commercial Managed Care - HMO | Admitting: Internal Medicine

## 2015-07-19 VITALS — BP 118/64 | HR 88 | Temp 98.0°F | Resp 16 | Ht 63.75 in | Wt 153.0 lb

## 2015-07-19 DIAGNOSIS — Z79899 Other long term (current) drug therapy: Secondary | ICD-10-CM

## 2015-07-19 DIAGNOSIS — Z794 Long term (current) use of insulin: Secondary | ICD-10-CM | POA: Diagnosis not present

## 2015-07-19 DIAGNOSIS — E119 Type 2 diabetes mellitus without complications: Secondary | ICD-10-CM

## 2015-07-19 DIAGNOSIS — I10 Essential (primary) hypertension: Secondary | ICD-10-CM

## 2015-07-19 DIAGNOSIS — E782 Mixed hyperlipidemia: Secondary | ICD-10-CM

## 2015-07-19 DIAGNOSIS — E559 Vitamin D deficiency, unspecified: Secondary | ICD-10-CM | POA: Diagnosis not present

## 2015-07-19 LAB — CBC WITH DIFFERENTIAL/PLATELET
BASOS PCT: 1 % (ref 0–1)
Basophils Absolute: 0.1 10*3/uL (ref 0.0–0.1)
Eosinophils Absolute: 0.4 10*3/uL (ref 0.0–0.7)
Eosinophils Relative: 5 % (ref 0–5)
HEMATOCRIT: 38.3 % (ref 36.0–46.0)
HEMOGLOBIN: 12.6 g/dL (ref 12.0–15.0)
LYMPHS ABS: 2.6 10*3/uL (ref 0.7–4.0)
LYMPHS PCT: 32 % (ref 12–46)
MCH: 27.3 pg (ref 26.0–34.0)
MCHC: 32.9 g/dL (ref 30.0–36.0)
MCV: 83.1 fL (ref 78.0–100.0)
MONO ABS: 0.6 10*3/uL (ref 0.1–1.0)
MONOS PCT: 7 % (ref 3–12)
MPV: 9.6 fL (ref 8.6–12.4)
NEUTROS ABS: 4.5 10*3/uL (ref 1.7–7.7)
NEUTROS PCT: 55 % (ref 43–77)
Platelets: 385 10*3/uL (ref 150–400)
RBC: 4.61 MIL/uL (ref 3.87–5.11)
RDW: 14.1 % (ref 11.5–15.5)
WBC: 8.1 10*3/uL (ref 4.0–10.5)

## 2015-07-19 NOTE — Patient Instructions (Signed)
[facility] [place]

## 2015-07-19 NOTE — Progress Notes (Signed)
Assessment and Plan:  Hypertension:  -Continue medication,  -monitor blood pressure at home.  -Continue DASH diet.   -Reminder to go to the ER if any CP, SOB, nausea, dizziness, severe HA, changes vision/speech, left arm numbness and tingling, and jaw pain.  Cholesterol: -Continue diet and exercise.  -Check cholesterol.   Diabetes: -Continue diet and exercise.  -Check A1C  Vitamin D Def: -check level -continue medications.   Depression -stable but could be worse secondary to husbands disability  Continue diet and meds as discussed. Further disposition pending results of labs.  HPI 60 y.o. female  presents for 3 month follow up with hypertension, hyperlipidemia, prediabetes and vitamin D.   Her blood pressure has been controlled at home, today their BP is BP: 118/64 mmHg.   She does not workout. She denies chest pain, shortness of breath, dizziness.  She reports that she hasn't been exercising.     She is not on cholesterol medication and denies myalgias. Her cholesterol is at goal. The cholesterol last visit was:   Lab Results  Component Value Date   CHOL 194 04/16/2015   HDL 60 04/16/2015   LDLCALC 88 04/16/2015   TRIG 229* 04/16/2015   CHOLHDL 3.2 04/16/2015     She has been working on diet and exercise for prediabetes, and denies foot ulcerations, hyperglycemia, hypoglycemia , increased appetite, nausea, paresthesia of the feet, polydipsia, polyuria, visual disturbances, vomiting and weight loss. Last A1C in the office was:  Lab Results  Component Value Date   HGBA1C 6.8* 04/16/2015  She reports that she doesn't check her blood sugar much.  She feels fine.    Patient is on Vitamin D supplement.  Lab Results  Component Value Date   VD25OH 54 04/16/2015      Current Medications:  Current Outpatient Prescriptions on File Prior to Visit  Medication Sig Dispense Refill  . acetaminophen (TYLENOL) 500 MG tablet Take 1,000 mg by mouth every 6 (six) hours as needed.  For pain    . B-D ULTRAFINE III SHORT PEN 31G X 8 MM MISC USE AS DIRECTED  WITH  LANTUS  INJECTIONS 90 each 3  . buPROPion (WELLBUTRIN XL) 300 MG 24 hr tablet     . cholecalciferol (VITAMIN D) 1000 UNITS tablet Take 2,000 Units by mouth daily.     . diclofenac sodium (VOLTAREN) 1 % GEL Apply 2 g topically 4 (four) times daily. 100 g 2  . glipiZIDE (GLUCOTROL) 5 MG tablet TAKE 1 TABLET (5 MG TOTAL) BY MOUTH DAILY BEFORE BREAKFAST. 90 tablet 1  . Insulin Glargine (LANTUS SOLOSTAR) 100 UNIT/ML Solostar Pen Inject 8 units daily AD or AD by PCP (Patient taking differently: Inject 12 units daily AD or AD by PCP) 45 mL 2  . losartan (COZAAR) 100 MG tablet Take 1/2 to 1 tablet daily as directed for BP & Kidney Protection 90 tablet 99  . omeprazole (PRILOSEC) 40 MG capsule TAKE 1 CAPSULE EVERY DAY 90 capsule 1  . pravastatin (PRAVACHOL) 40 MG tablet TAKE 1 TABLET EVERY DAY 90 tablet 1  . ranitidine (ZANTAC) 300 MG tablet Take 300 mg by mouth at bedtime. prn    . Saxagliptin-Metformin 2.09-998 MG TB24 Take 1 tablet by mouth 2 (two) times daily with a meal. 180 tablet 3  . venlafaxine XR (EFFEXOR-XR) 75 MG 24 hr capsule     . Vitamins A & D 5000-400 UNITS CAPS     . zolpidem (AMBIEN) 10 MG tablet Take 10 mg by mouth  at bedtime.      No current facility-administered medications on file prior to visit.    Medical History:  Past Medical History  Diagnosis Date  . Hypertension   . Hypercholesteremia   . GERD (gastroesophageal reflux disease)   . Mental disorder   . Anxiety   . Depression   . Chest pain 04/2011    saw Dr Ron Parker..not heart related  sent to GI dr. and found gallstones.  . Complication of anesthesia 2001; 20012    "slow to wake up"  . Type II diabetes mellitus ([place]) 2003  . H/O hiatal hernia     Allergies:  Allergies  Allergen Reactions  . Codeine Palpitations and Other (See Comments)    Makes her head feel "weird"  . Glucophage [Metformin Hcl] Diarrhea  . Nsaids Other (See  Comments)    "upset stomach"     Review of Systems:  Review of Systems  Constitutional: Negative for fever, chills and malaise/fatigue.  HENT: Negative for congestion, ear pain and sore throat.   Eyes: Negative.   Respiratory: Negative.   Cardiovascular: Negative for chest pain, palpitations and leg swelling.  Gastrointestinal: Negative for heartburn, diarrhea, constipation, blood in stool and melena.  Genitourinary: Negative.   Skin: Negative.   Neurological: Negative for dizziness, loss of consciousness and headaches.  Psychiatric/Behavioral: Negative for depression. The patient is not nervous/anxious and does not have insomnia.     Family history- Review and unchanged  Social history- Review and unchanged  Physical Exam: BP 118/64 mmHg  Pulse 88  Temp(Src) 98 F (36.7 C) (Temporal)  Resp 16  Ht 5' 3.75" (1.619 m)  Wt 153 lb (69.4 kg)  BMI 26.48 kg/m2 Wt Readings from Last 3 Encounters:  07/19/15 153 lb (69.4 kg)  06/04/15 153 lb (69.4 kg)  04/16/15 153 lb (69.4 kg)    General Appearance: Well nourished well developed, in no apparent distress. Eyes: PERRLA, EOMs, conjunctiva no swelling or erythema ENT/Mouth: Ear canals normal without obstruction, swelling, erythma, discharge.  TMs normal bilaterally.  Oropharynx moist, clear, without exudate, or postoropharyngeal swelling. Neck: Supple, thyroid normal,no cervical adenopathy  Respiratory: Respiratory effort normal, Breath sounds clear A&P without rhonchi, wheeze, or rale.  No retractions, no accessory usage. Cardio: RRR with no MRGs. Brisk peripheral pulses without edema.  Abdomen: Soft, + BS,  Non tender, no guarding, rebound, hernias, masses. Musculoskeletal: Full ROM, 5/5 strength, Normal gait Skin: Warm, dry without rashes, lesions, ecchymosis.  Neuro: Awake and oriented X 3, Cranial nerves intact. Normal muscle tone, no cerebellar symptoms. Psych: Normal affect, Insight and Judgment appropriate.    Starlyn Skeans, PA-C 4:07 PM [facility]

## 2015-07-20 LAB — LIPID PANEL
CHOL/HDL RATIO: 3.3 ratio (ref <=5.0)
CHOLESTEROL: 214 mg/dL — AB (ref 125–200)
HDL: 65 mg/dL (ref >=46)
LDL Cholesterol: 97 mg/dL (ref <130)
Triglycerides: 258 mg/dL — ABNORMAL HIGH (ref <150)
VLDL: 52 mg/dL — ABNORMAL HIGH (ref <30)

## 2015-07-20 LAB — TSH: TSH: 1.44 mIU/L

## 2015-07-20 LAB — BASIC METABOLIC PANEL WITH GFR
BUN: 10 mg/dL (ref 7–25)
CHLORIDE: 100 mmol/L (ref 98–110)
CO2: 24 mmol/L (ref 20–31)
CREATININE: 0.73 mg/dL (ref 0.50–1.05)
Calcium: 9.2 mg/dL (ref 8.6–10.4)
GFR, Est African American: >89 mL/min (ref >=60)
GFR, Est Non African American: >89 mL/min (ref >=60)
GLUCOSE: 183 mg/dL — AB (ref 65–99)
Potassium: 4.3 mmol/L (ref 3.5–5.3)
SODIUM: 138 mmol/L (ref 135–146)

## 2015-07-20 LAB — HEPATIC FUNCTION PANEL
ALBUMIN: 4 g/dL (ref 3.6–5.1)
ALK PHOS: 84 U/L (ref 33–130)
ALT: 12 U/L (ref 6–29)
AST: 12 U/L (ref 10–35)
BILIRUBIN TOTAL: 0.3 mg/dL (ref 0.2–1.2)
Bilirubin, Direct: 0.1 mg/dL (ref <=0.2)
Indirect Bilirubin: 0.2 mg/dL (ref 0.2–1.2)
TOTAL PROTEIN: 7.2 g/dL (ref 6.1–8.1)

## 2015-07-20 LAB — HEMOGLOBIN A1C
HEMOGLOBIN A1C: 6.9 % — AB (ref <5.7)
Mean Plasma Glucose: 151 mg/dL — ABNORMAL HIGH (ref <117)

## 2015-08-22 ENCOUNTER — Ambulatory Visit (INDEPENDENT_AMBULATORY_CARE_PROVIDER_SITE_OTHER): Payer: Commercial Managed Care - HMO | Admitting: Physician Assistant

## 2015-08-22 ENCOUNTER — Encounter: Payer: Self-pay | Admitting: Physician Assistant

## 2015-08-22 VITALS — BP 100/60 | HR 91 | Temp 97.5°F | Resp 16 | Ht 63.75 in | Wt 154.4 lb

## 2015-08-22 DIAGNOSIS — R1013 Epigastric pain: Secondary | ICD-10-CM

## 2015-08-22 DIAGNOSIS — K2100 Gastro-esophageal reflux disease with esophagitis, without bleeding: Secondary | ICD-10-CM

## 2015-08-22 NOTE — Patient Instructions (Signed)
Food Choices for Gastroesophageal Reflux Disease, Adult When you have gastroesophageal reflux disease (GERD), the foods you eat and your eating habits are very important. Choosing the right foods can help ease the discomfort of GERD. WHAT GENERAL GUIDELINES DO I NEED TO FOLLOW?  Choose fruits, vegetables, whole grains, low-fat dairy products, and low-fat meat, fish, and poultry.  Limit fats such as oils, salad dressings, butter, nuts, and avocado.  Keep a food diary to identify foods that cause symptoms.  Avoid foods that cause reflux. These may be different for different people.  Eat frequent small meals instead of three large meals each day.  Eat your meals slowly, in a relaxed setting.  Limit fried foods.  Cook foods using methods other than frying.  Avoid drinking alcohol.  Avoid drinking large amounts of liquids with your meals.  Avoid bending over or lying down until 2-3 hours after eating. WHAT FOODS ARE NOT RECOMMENDED? The following are some foods and drinks that may worsen your symptoms: Vegetables Tomatoes. Tomato juice. Tomato and spaghetti sauce. Chili peppers. Onion and garlic. Horseradish. Fruits Oranges, grapefruit, and lemon (fruit and juice). Meats High-fat meats, fish, and poultry. This includes hot dogs, ribs, ham, sausage, salami, and bacon. Dairy Whole milk and chocolate milk. Sour cream. Cream. Butter. Ice cream. Cream cheese.  Beverages Coffee and tea, with or without caffeine. Carbonated beverages or energy drinks. Condiments Hot sauce. Barbecue sauce.  Sweets/Desserts Chocolate and cocoa. Donuts. Peppermint and spearmint. Fats and Oils High-fat foods, including French fries and potato chips. Other Vinegar. Strong spices, such as black pepper, white pepper, red pepper, cayenne, curry powder, cloves, ginger, and chili powder. The items listed above may not be a complete list of foods and beverages to avoid. Contact your dietitian for more  information.   This information is not intended to replace advice given to you by your health care provider. Make sure you discuss any questions you have with your health care provider.   Document Released: 05/05/2005 Document Revised: 05/26/2014 Document Reviewed: 03/09/2013 Elsevier Interactive Patient Education 2016 Elsevier Inc.  

## 2015-08-22 NOTE — Progress Notes (Signed)
   Subjective:    Patient ID: Mindy Richard, female    DOB: 1955/07/30, 60 y.o.   MRN: [id number]  HPI 60 y.o. WF presents with worsening indigestion. She was treated with dexilant for something similar in Jan that improved. She is still on prilosec 40mg  daily. This most recent episode started 1 week ago after eating pasta and tomato sauce. Better after some tums. Has been having some constant right shoulder blade pain, comes and goes, nothing worse, nothing better. Had colonoscopy in 2012, sees Dr. Amedeo Plenty. She is s/p cholecystomy due to common bile duct stone in 2013. Normal stress test 2013. No ABX use, no ETOH/NSAIDS.   Lab Results  Component Value Date   ALT 12 07/19/2015   AST 12 07/19/2015   ALKPHOS 84 07/19/2015   BILITOT 0.3 07/19/2015    Review of Systems  Constitutional: Negative for fever and chills.  HENT: Negative for congestion, ear pain and sore throat.   Eyes: Negative.   Respiratory: Negative.   Cardiovascular: Negative for chest pain, palpitations and leg swelling.  Gastrointestinal: Positive for abdominal pain (epigastric, with GERD, and burping). Negative for nausea, vomiting, diarrhea, constipation, blood in stool, abdominal distention, anal bleeding and rectal pain.  Genitourinary: Negative.   Skin: Negative.   Neurological: Negative for dizziness and headaches.  Psychiatric/Behavioral: The patient is not nervous/anxious.        Objective:   Physical Exam  Constitutional: She is oriented to person, place, and time. She appears well-developed and well-nourished.  HENT:  Head: Normocephalic and atraumatic.  Right Ear: External ear normal.  Left Ear: External ear normal.  Mouth/Throat: Oropharynx is clear and moist.  Eyes: Conjunctivae and EOM are normal. Pupils are equal, round, and reactive to light.  Neck: Normal range of motion. Neck supple. No thyromegaly present.  Cardiovascular: Normal rate, regular rhythm and normal heart sounds.  Exam reveals no  gallop and no friction rub.   No murmur heard. Pulmonary/Chest: Effort normal and breath sounds normal. No respiratory distress. She has no wheezes.  Abdominal: Soft. Bowel sounds are normal. She exhibits no shifting dullness, no distension, no abdominal bruit, no pulsatile midline mass and no mass. There is no hepatosplenomegaly. There is tenderness in the epigastric area. There is guarding. There is no rigidity, no rebound, no CVA tenderness, no tenderness at McBurney's point and negative Murphy's sign (NEGATIVE). No hernia.  Musculoskeletal: Normal range of motion.  Lymphadenopathy:    She has no cervical adenopathy.  Neurological: She is alert and oriented to person, place, and time.  Skin: Skin is warm and dry.  Psychiatric: She has a normal mood and affect.       Assessment & Plan:  AB pain/GERD Unlikely stones in common bile duct due to normal labs/neg murphys but if any worsening pain go to ER Dexilant samples given, follow up with GI for possible EGD to rule out ulcer/Hpylori/HH

## 2015-09-02 ENCOUNTER — Other Ambulatory Visit: Payer: Self-pay | Admitting: Internal Medicine

## 2015-09-25 DIAGNOSIS — K219 Gastro-esophageal reflux disease without esophagitis: Secondary | ICD-10-CM | POA: Diagnosis not present

## 2015-10-05 ENCOUNTER — Other Ambulatory Visit: Payer: Self-pay | Admitting: Gastroenterology

## 2015-10-05 DIAGNOSIS — K317 Polyp of stomach and duodenum: Secondary | ICD-10-CM | POA: Diagnosis not present

## 2015-10-05 DIAGNOSIS — K2970 Gastritis, unspecified, without bleeding: Secondary | ICD-10-CM | POA: Diagnosis not present

## 2015-10-05 DIAGNOSIS — K2950 Unspecified chronic gastritis without bleeding: Secondary | ICD-10-CM | POA: Diagnosis not present

## 2015-10-05 DIAGNOSIS — K449 Diaphragmatic hernia without obstruction or gangrene: Secondary | ICD-10-CM | POA: Diagnosis not present

## 2015-10-05 DIAGNOSIS — R1013 Epigastric pain: Secondary | ICD-10-CM | POA: Diagnosis not present

## 2015-10-23 ENCOUNTER — Ambulatory Visit (INDEPENDENT_AMBULATORY_CARE_PROVIDER_SITE_OTHER): Payer: Commercial Managed Care - HMO | Admitting: Physician Assistant

## 2015-10-23 ENCOUNTER — Encounter: Payer: Self-pay | Admitting: Physician Assistant

## 2015-10-23 VITALS — BP 118/72 | HR 98 | Temp 98.0°F | Resp 16 | Ht 63.75 in | Wt 155.0 lb

## 2015-10-23 DIAGNOSIS — E559 Vitamin D deficiency, unspecified: Secondary | ICD-10-CM

## 2015-10-23 DIAGNOSIS — E1142 Type 2 diabetes mellitus with diabetic polyneuropathy: Secondary | ICD-10-CM | POA: Diagnosis not present

## 2015-10-23 DIAGNOSIS — Z79899 Other long term (current) drug therapy: Secondary | ICD-10-CM

## 2015-10-23 DIAGNOSIS — E782 Mixed hyperlipidemia: Secondary | ICD-10-CM

## 2015-10-23 DIAGNOSIS — I10 Essential (primary) hypertension: Secondary | ICD-10-CM | POA: Diagnosis not present

## 2015-10-23 LAB — HEPATIC FUNCTION PANEL
ALT: 14 U/L (ref 6–29)
AST: 15 U/L (ref 10–35)
Albumin: 4.2 g/dL (ref 3.6–5.1)
Alkaline Phosphatase: 87 U/L (ref 33–130)
Bilirubin, Direct: 0.1 mg/dL (ref <=0.2)
Indirect Bilirubin: 0.2 mg/dL (ref 0.2–1.2)
Total Bilirubin: 0.3 mg/dL (ref 0.2–1.2)
Total Protein: 7 g/dL (ref 6.1–8.1)

## 2015-10-23 LAB — CBC WITH DIFFERENTIAL/PLATELET
Basophils Absolute: 81 cells/uL (ref 0–200)
Basophils Relative: 1 %
Eosinophils Absolute: 243 cells/uL (ref 15–500)
Eosinophils Relative: 3 %
HCT: 38 % (ref 35.0–45.0)
Hemoglobin: 12.8 g/dL (ref 11.7–15.5)
Lymphocytes Relative: 31 %
Lymphs Abs: 2511 cells/uL (ref 850–3900)
MCH: 27.8 pg (ref 27.0–33.0)
MCHC: 33.7 g/dL (ref 32.0–36.0)
MCV: 82.4 fL (ref 80.0–100.0)
MPV: 9.3 fL (ref 7.5–12.5)
Monocytes Absolute: 567 cells/uL (ref 200–950)
Monocytes Relative: 7 %
Neutro Abs: 4698 cells/uL (ref 1500–7800)
Neutrophils Relative %: 58 %
Platelets: 384 10*3/uL (ref 140–400)
RBC: 4.61 MIL/uL (ref 3.80–5.10)
RDW: 14.1 % (ref 11.0–15.0)
WBC: 8.1 10*3/uL (ref 3.8–10.8)

## 2015-10-23 LAB — BASIC METABOLIC PANEL WITH GFR
BUN: 10 mg/dL (ref 7–25)
CHLORIDE: 98 mmol/L (ref 98–110)
CO2: 24 mmol/L (ref 20–31)
Calcium: 9.3 mg/dL (ref 8.6–10.4)
Creat: 0.73 mg/dL (ref 0.50–1.05)
GFR, Est African American: >89 mL/min (ref >=60)
Glucose, Bld: 137 mg/dL — ABNORMAL HIGH (ref 65–99)
Potassium: 4.7 mmol/L (ref 3.5–5.3)
SODIUM: 137 mmol/L (ref 135–146)

## 2015-10-23 LAB — HEMOGLOBIN A1C
HEMOGLOBIN A1C: 6.9 % — AB (ref <5.7)
MEAN PLASMA GLUCOSE: 151 mg/dL

## 2015-10-23 LAB — MAGNESIUM: MAGNESIUM: 2 mg/dL (ref 1.5–2.5)

## 2015-10-23 LAB — LIPID PANEL
CHOL/HDL RATIO: 3 ratio (ref <=5.0)
Cholesterol: 195 mg/dL (ref 125–200)
HDL: 66 mg/dL (ref >=46)
LDL CALC: 83 mg/dL (ref <130)
Triglycerides: 228 mg/dL — ABNORMAL HIGH (ref <150)
VLDL: 46 mg/dL — AB (ref <30)

## 2015-10-23 NOTE — Progress Notes (Signed)
Assessment and Plan:  Hypertension:  -Continue medication,  -monitor blood pressure at home.  -Continue DASH diet.   -Reminder to go to the ER if any CP, SOB, nausea, dizziness, severe HA, changes vision/speech, left arm numbness and tingling, and jaw pain.  Cholesterol: -Continue diet and exercise.  -Check cholesterol.   Diabetes: -Continue diet and exercise.  -Check A1C  Vitamin D Def: -check level -continue medications.   Polyneuropathy/mononeuropathy Try lyrica 50 samples at night, if not better send back to ortho  Continue diet and meds as discussed. Further disposition pending results of labs.  HPI 60 y.o. female  presents for 3 month follow up with hypertension, hyperlipidemia, prediabetes and vitamin D.   Her blood pressure has been controlled at home, today their BP is BP: 118/72 mmHg.   She does not workout. She denies chest pain, shortness of breath, dizziness.  She reports that she hasn't been exercising.    Has had GERD, had gastritis and polyp with EGD 2 weeks ago, is on nexium twice daily, and will go back to see Dr. Amedeo Plenty on the 14th. Negative h pylori   She is not on cholesterol medication and denies myalgias. Her cholesterol is at goal. The cholesterol last visit was:   Lab Results  Component Value Date   CHOL 214* 07/19/2015   HDL 65 07/19/2015   LDLCALC 97 07/19/2015   TRIG 258* 07/19/2015   CHOLHDL 3.3 07/19/2015     She has been working on diet and exercise for diabetes, and denies foot ulcerations, hyperglycemia, hypoglycemia , increased appetite, nausea, paresthesia of the feet, polydipsia, polyuria, visual disturbances, vomiting and weight loss. Last A1C in the office was:  Lab Results  Component Value Date   HGBA1C 6.9* 07/19/2015  She reports that she doesn't check her blood sugar much.  She feels fine.    Patient is on Vitamin D supplement.  Lab Results  Component Value Date   VD25OH 54 04/16/2015     BMI is Body mass index is 26.82  kg/(m^2)., she is working on diet and exercise. Wt Readings from Last 3 Encounters:  10/23/15 155 lb (70.308 kg)  08/22/15 154 lb 6.4 oz (70.035 kg)  07/19/15 153 lb (69.4 kg)    Current Medications:  Current Outpatient Prescriptions on File Prior to Visit  Medication Sig Dispense Refill  . acetaminophen (TYLENOL) 500 MG tablet Take 1,000 mg by mouth every 6 (six) hours as needed. For pain    . B-D ULTRAFINE III SHORT PEN 31G X 8 MM MISC USE AS DIRECTED  WITH  LANTUS  INJECTIONS 90 each 3  . buPROPion (WELLBUTRIN XL) 300 MG 24 hr tablet     . cholecalciferol (VITAMIN D) 1000 UNITS tablet Take 2,000 Units by mouth daily.     . diclofenac sodium (VOLTAREN) 1 % GEL Apply 2 g topically 4 (four) times daily. 100 g 2  . glipiZIDE (GLUCOTROL) 5 MG tablet TAKE 1 TABLET EVERY DAY BEFORE BREAKFAST 90 tablet 1  . Insulin Glargine (LANTUS SOLOSTAR) 100 UNIT/ML Solostar Pen Inject 8 units daily AD or AD by PCP (Patient taking differently: Inject 12 units daily AD or AD by PCP) 45 mL 2  . losartan (COZAAR) 100 MG tablet Take 1/2 to 1 tablet daily as directed for BP & Kidney Protection 90 tablet 99  . omeprazole (PRILOSEC) 40 MG capsule TAKE 1 CAPSULE EVERY DAY 90 capsule 1  . pravastatin (PRAVACHOL) 40 MG tablet TAKE 1 TABLET EVERY DAY 90 tablet 1  .  ranitidine (ZANTAC) 300 MG tablet Take 300 mg by mouth at bedtime. prn    . Saxagliptin-Metformin 2.09-998 MG TB24 Take 1 tablet by mouth 2 (two) times daily with a meal. 180 tablet 3  . venlafaxine XR (EFFEXOR-XR) 75 MG 24 hr capsule     . Vitamins A & D 5000-400 UNITS CAPS     . zolpidem (AMBIEN) 10 MG tablet Take 10 mg by mouth at bedtime.      No current facility-administered medications on file prior to visit.    Medical History:  Past Medical History  Diagnosis Date  . Hypertension   . Hypercholesteremia   . GERD (gastroesophageal reflux disease)   . Mental disorder   . Anxiety   . Depression   . Chest pain 04/2011    saw Dr Ron Parker..not  heart related  sent to GI dr. and found gallstones.  . Complication of anesthesia 2001; 20012    "slow to wake up"  . Type II diabetes mellitus ([place]) 2003  . H/O hiatal hernia     Allergies:  Allergies  Allergen Reactions  . Codeine Palpitations and Other (See Comments)    Makes her head feel "weird"  . Glucophage [Metformin Hcl] Diarrhea  . Nsaids Other (See Comments)    "upset stomach"     Review of Systems:  Review of Systems  Constitutional: Negative for fever, chills and malaise/fatigue.  HENT: Negative for congestion, ear pain and sore throat.   Eyes: Negative.   Respiratory: Negative.   Cardiovascular: Negative for chest pain, palpitations and leg swelling.  Gastrointestinal: Negative for heartburn, diarrhea, constipation, blood in stool and melena.  Genitourinary: Negative.   Skin: Negative.   Neurological: Negative for dizziness, loss of consciousness and headaches.  Psychiatric/Behavioral: Negative for depression. The patient is not nervous/anxious and does not have insomnia.     Family history- Review and unchanged  Social history- Review and unchanged  Physical Exam: BP 118/72 mmHg  Pulse 98  Temp(Src) 98 F (36.7 C) (Temporal)  Resp 16  Ht 5' 3.75" (1.619 m)  Wt 155 lb (70.308 kg)  BMI 26.82 kg/m2 Wt Readings from Last 3 Encounters:  10/23/15 155 lb (70.308 kg)  08/22/15 154 lb 6.4 oz (70.035 kg)  07/19/15 153 lb (69.4 kg)    General Appearance: Well nourished well developed, in no apparent distress. Eyes: PERRLA, EOMs, conjunctiva no swelling or erythema ENT/Mouth: Ear canals normal without obstruction, swelling, erythma, discharge.  TMs normal bilaterally.  Oropharynx moist, clear, without exudate, or postoropharyngeal swelling. Neck: Supple, thyroid normal,no cervical adenopathy  Respiratory: Respiratory effort normal, Breath sounds clear A&P without rhonchi, wheeze, or rale.  No retractions, no accessory usage. Cardio: RRR with no MRGs. Brisk  peripheral pulses without edema.  Abdomen: Soft, + BS,  Non tender, no guarding, rebound, hernias, masses. Musculoskeletal: Full ROM, 5/5 strength, Normal gait Skin: Warm, dry without rashes, lesions, ecchymosis.  Neuro: Awake and oriented X 3, Cranial nerves intact. Normal muscle tone, no cerebellar symptoms. Psych: Normal affect, Insight and Judgment appropriate.    Vicie Mutters, PA-C 3:58 PM [facility]

## 2015-10-23 NOTE — Patient Instructions (Signed)
Can take the lyrica samples for nerve pain. It can make you sleepy so we suggest trying it at night first and please plan to not drive or do anything strenuous. Also please do not take this medication with alcohol.  Start out 1 pill at night before bed, can increase to 2 pills at night before bed. Please call the office if you have any side effects.   Can take 3 pills a day however you would like  Some examples: - 1 breakfast, lunch, bedtime. - 1 at breakfast, 2 at bed time  How should I use this medicine? Take this medicine by mouth with a glass of water. Follow the directions on the prescription label. You can take this medicine with or without food. Take your doses at regular intervals. Do not take your medicine more often than directed. Do not stop taking except on your doctor's advice.  What if I miss a dose? If you miss a dose, take it as soon as you can. If it is almost time for your next dose, take only that dose. Do not take double or extra doses.  What should I watch for while using this medicine? Tell your doctor or healthcare professional if your symptoms do not start to get better or if they get worse.   You may get drowsy or dizzy. Do not drive, use machinery, or do anything that needs mental alertness until you know how this medicine affects you. Do not stand or sit up quickly, especially if you are an older patient. This reduces the risk of dizzy or fainting spells. Alcohol may interfere with the effect of this medicine. Avoid alcoholic drinks. If you have a heart condition, like congestive heart failure, and notice that you are retaining water and have swelling in your hands or feet, contact your health care provider immediately.  What side effects may I notice from receiving this medicine? Side effects that you should report to your doctor or health care professional as soon as possible and are very rare: -allergic reactions like skin rash, itching or hives, swelling of the  face, lips, or tongue -breathing problems -changes in vision -jerking or unusual movements of any part of your body -suicidal thoughts or other mood changes -swelling of the ankles, feet, hands -unusual bruising or bleeding  Side effects that usually do not require medical attention (Report these to your doctor or health care professional if they continue or are bothersome.): -dizziness -drowsiness -dry mouth -nausea -tremors

## 2015-10-24 LAB — VITAMIN D 25 HYDROXY (VIT D DEFICIENCY, FRACTURES): VIT D 25 HYDROXY: 68 ng/mL (ref 30–100)

## 2015-10-24 LAB — TSH: TSH: 1.76 mIU/L

## 2015-10-30 ENCOUNTER — Other Ambulatory Visit: Payer: Self-pay | Admitting: Internal Medicine

## 2015-10-31 DIAGNOSIS — K219 Gastro-esophageal reflux disease without esophagitis: Secondary | ICD-10-CM | POA: Diagnosis not present

## 2015-11-12 ENCOUNTER — Other Ambulatory Visit: Payer: Self-pay | Admitting: Internal Medicine

## 2015-11-13 ENCOUNTER — Ambulatory Visit (INDEPENDENT_AMBULATORY_CARE_PROVIDER_SITE_OTHER): Payer: Commercial Managed Care - HMO | Admitting: Family Medicine

## 2015-11-13 ENCOUNTER — Other Ambulatory Visit: Payer: Self-pay | Admitting: *Deleted

## 2015-11-13 ENCOUNTER — Ambulatory Visit (INDEPENDENT_AMBULATORY_CARE_PROVIDER_SITE_OTHER): Payer: Commercial Managed Care - HMO

## 2015-11-13 VITALS — BP 118/76 | HR 104 | Temp 98.3°F | Resp 17 | Ht 63.75 in | Wt 156.0 lb

## 2015-11-13 DIAGNOSIS — S67191A Crushing injury of left index finger, initial encounter: Secondary | ICD-10-CM

## 2015-11-13 DIAGNOSIS — S6992XA Unspecified injury of left wrist, hand and finger(s), initial encounter: Secondary | ICD-10-CM | POA: Diagnosis not present

## 2015-11-13 DIAGNOSIS — S6710XA Crushing injury of unspecified finger(s), initial encounter: Secondary | ICD-10-CM

## 2015-11-13 MED ORDER — LOSARTAN POTASSIUM 100 MG PO TABS: ORAL_TABLET | ORAL | Status: DC

## 2015-11-13 MED ORDER — MELOXICAM 7.5 MG PO TABS: 7.5000 mg | ORAL_TABLET | Freq: Two times a day (BID) | ORAL | Status: DC | PRN

## 2015-11-13 NOTE — Progress Notes (Signed)
By signing my name below, I, Mesha Guinyard, attest that this documentation has been prepared under the direction and in the presence of Delman Cheadle, MD.  Electronically Signed: Verlee Monte, Medical Scribe. 11/13/2015. 5:38 PM.  Subjective:    Patient ID: Mindy Richard, female    DOB: 1955-10-20, 60 y.o.   MRN: [id number]  HPI Chief Complaint  Patient presents with  . Finger Injury    Left index     HPI Comments: Mindy Richard is a 60 y.o. female who presents to the [facility] complaining of left second digit pain onset 2 hours ago. Pt slammed a car door on her finger and couldn't bend her DIP joint without severe pain afterwards. Pt denies taking any medication for her injury. Pt did well on Celebrex prior and she has Voltaren gel at home.  Patient Active Problem List   Diagnosis Date Noted  . OSA (obstructive sleep apnea) 06/04/2015  . Neuropathy, diabetic ([place]) 04/16/2015  . Medicare annual wellness visit, initial 01/11/2015  . Insulin-requiring or dependent type II diabetes mellitus ([place]) 08/10/2013  . Vitamin D deficiency 04/03/2013  . Medication management 04/03/2013  . Hyperlipidemia 04/03/2013  . Hypertension 05/09/2011  . Depression, major, in remission ([place]) 05/09/2011  . GERD 05/09/2011   Past Medical History  Diagnosis Date  . Hypertension   . Hypercholesteremia   . GERD (gastroesophageal reflux disease)   . Mental disorder   . Anxiety   . Depression   . Chest pain 04/2011    saw Dr Ron Parker..not heart related  sent to GI dr. and found gallstones.  . Complication of anesthesia 2001; 20012    "slow to wake up"  . Type II diabetes mellitus ([place]) 2003  . H/O hiatal hernia    Past Surgical History  Procedure Laterality Date  . Shoulder arthroscopy w/ rotator cuff repair  2012    left  . Cesarean section  1985  . Cholecystectomy  09/02/11  . Carpal tunnel release  2001    right  . Tonsillectomy  ~ 1970  . Dilation and curettage of uterus   1984  . Tubal ligation  1985?  Marland Kitchen Colonoscopy    . Cholecystectomy  09/02/2011    Procedure: LAPAROSCOPIC CHOLECYSTECTOMY WITH INTRAOPERATIVE CHOLANGIOGRAM;  Surgeon: Haywood Lasso, MD;  Location: [facility];  Service: General;  Laterality: N/A;  . Ercp  09/03/2011    Procedure: ENDOSCOPIC RETROGRADE CHOLANGIOPANCREATOGRAPHY (ERCP);  Surgeon: Missy Sabins, MD;  Location: [facility] ENDOSCOPY;  Service: Endoscopy;  Laterality: N/A;  sphincterotomy and stone extraction   Allergies  Allergen Reactions  . Codeine Palpitations and Other (See Comments)    Makes her head feel "weird"  . Glucophage [Metformin Hcl] Diarrhea  . Nsaids Other (See Comments)    "upset stomach"   Prior to Admission medications   Medication Sig Start Date End Date Taking? Authorizing Provider  acetaminophen (TYLENOL) 500 MG tablet Take 1,000 mg by mouth every 6 (six) hours as needed. For pain   Yes Historical Provider, MD  B-D ULTRAFINE III SHORT PEN 31G X 8 MM MISC USE AS DIRECTED  WITH  LANTUS  INJECTIONS 06/24/15  Yes Unk [place], MD  buPROPion (WELLBUTRIN XL) 300 MG 24 hr tablet  05/21/14  Yes Historical Provider, MD  cholecalciferol (VITAMIN D) 1000 UNITS tablet Take 2,000 Units by mouth daily.    Yes Historical Provider, MD  diclofenac sodium (VOLTAREN) 1 % GEL Apply 2 g topically 4 (four) times daily. 02/15/14  Yes Quentin MullingAmanda Collier, PA-C  glipiZIDE (GLUCOTROL) 5 MG tablet TAKE 1 TABLET EVERY DAY BEFORE BREAKFAST 09/03/15  Yes Lucky CowboyWilliam McKeown, MD  Insulin Glargine (LANTUS SOLOSTAR) 100 UNIT/ML Solostar Pen Inject 8 units daily AD or AD by PCP Patient taking differently: Inject 12 units daily AD or AD by PCP 12/11/14  Yes Lucky CowboyWilliam McKeown, MD  KOMBIGLYZE XR 2.09-998 MG TB24 TAKE 1 TABLET TWICE DAILY WITH A MEAL 10/30/15  Yes Lucky CowboyWilliam McKeown, MD  losartan (COZAAR) 100 MG tablet Take 1/2 to 1 tablet daily as directed for BP & Kidney Protection 11/13/15  Yes Lucky CowboyWilliam McKeown, MD  omeprazole (PRILOSEC) 40 MG capsule TAKE 1 CAPSULE EVERY  DAY 11/13/15  Yes Quentin MullingAmanda Collier, PA-C  pravastatin (PRAVACHOL) 40 MG tablet TAKE 1 TABLET EVERY DAY 11/13/15  Yes Quentin MullingAmanda Collier, PA-C  ranitidine (ZANTAC) 300 MG tablet Take 300 mg by mouth at bedtime. prn   Yes Historical Provider, MD  venlafaxine XR (EFFEXOR-XR) 75 MG 24 hr capsule  05/21/14  Yes Historical Provider, MD  Vitamins A & D 5000-400 UNITS CAPS  01/30/15  Yes Historical Provider, MD  zolpidem (AMBIEN) 10 MG tablet Take 10 mg by mouth at bedtime.  05/06/11  Yes Historical Provider, MD   Social History   Social History  . Marital Status: Married    Spouse Name: N/A  . Number of Children: 1  . Years of Education: N/A   Occupational History  . Housewife    Social History Main Topics  . Smoking status: Never Smoker   . Smokeless tobacco: Never Used  . Alcohol Use: No  . Drug Use: No  . Sexual Activity: Not Currently   Other Topics Concern  . Not on file   Social History Narrative   [facility] 11/13/2015 06/04/2015 04/16/2015 02/15/2014 10/11/2013  Decreased Interest 0 0 0 0 0  Down, Depressed, Hopeless 0 0 0 0 0  PHQ - 2 Score 0 0 0 0 0   Review of Systems  Constitutional: Negative for fever.  HENT: Negative for sore throat.   Respiratory: Negative for cough and shortness of breath.   Musculoskeletal: Negative for arthralgias.       Finger injury  Skin: Negative for color change and rash.  Neurological: Negative for headaches.   Objective:  BP 118/76 mmHg  Pulse 104  Temp(Src) 98.3 F (36.8 C) (Oral)  Resp 17  Ht 5' 3.75" (1.619 m)  Wt 156 lb (70.761 kg)  BMI 27.00 kg/m2  SpO2 98%  Physical Exam  Constitutional: She appears well-developed and well-nourished. No distress.  HENT:  Head: Normocephalic and atraumatic.  Eyes: Conjunctivae are normal.  Neck: Neck supple.  Cardiovascular: Normal rate.   Pulmonary/Chest: Effort normal.  Neurological: She is alert.  Skin: Skin is warm and dry.  Abrasion on the  lateral aspect between the PIP and  DIP joint of the left second finger  Psychiatric: She has a normal mood and affect. Her behavior is normal.  Nursing note and vitals reviewed.  Dg Finger Index Left  11/13/2015  CLINICAL DATA:  Pain after trauma. EXAM: LEFT INDEX FINGER 2+V COMPARISON:  None. FINDINGS: Degenerative changes at the distal interphalangeal joint of the index finger. No acute fractures identified. IMPRESSION: No acute abnormalities. Electronically Signed   By: Gerome Samavid  Williams III M.D   On: 11/13/2015 18:17     Assessment & Plan:   1. Crush injury to finger, initial encounter   No bony injury. Ok to try meloxicam or the  voltaren gel she has at home. Ice, placed in metal U splint for protection but remove sev times a day to maintain ROM.  Orders Placed This Encounter  Procedures  . DG Finger Index Left    Standing Status: Future     Number of Occurrences: 1     Standing Expiration Date: 11/12/2016    Order Specific Question:  Reason for Exam (SYMPTOM  OR DIAGNOSIS REQUIRED)    Answer:  crush injury to phalynx between ip joints - in car door    Order Specific Question:  Is the patient pregnant?    Answer:  No    Order Specific Question:  Preferred imaging location?    Answer:  External    Meds ordered this encounter  Medications  . meloxicam (MOBIC) 7.5 MG tablet    Sig: Take 1 tablet (7.5 mg total) by mouth 2 (two) times daily as needed for pain.    Dispense:  60 tablet    Refill:  0    I personally performed the services described in this documentation, which was scribed in my presence. The recorded information has been reviewed and considered, and addended by me as needed.   Delman Cheadle, M.D.  [facility] [address] [place], [place] [address] [phone] phone [phone] fax  11/14/2015 4:15 AM

## 2015-11-13 NOTE — Patient Instructions (Addendum)
IF you received an x-ray today, you will receive an invoice from [facility]. Please contact [facility] at [phone] with questions or concerns regarding your invoice.   IF you received labwork today, you will receive an invoice from [employer]. Please contact [facility] at [phone] with questions or concerns regarding your invoice.   Our billing staff will not be able to assist you with questions regarding bills from these companies.  You will be contacted with the lab results as soon as they are available. The fastest way to get your results is to activate your My Chart account. Instructions are located on the last page of this paperwork. If you have not heard from [place] regarding the results in 2 weeks, please contact this office.     We recommend that you schedule a mammogram for breast cancer screening. Typically, you do not need a referral to do this. Please contact a local imaging center to schedule your mammogram.  [facility] - [phone]  *ask for the Radiology [place] - [phone] or [phone]  [facility] - [phone] [place] [phone] [facility] - [phone]  *ask for the [facility] - [phone]  *ask for the Radiology Department [facility] - [phone]  *ask for the Mammography Department [facility] - [phone]  Crush Injury, Fingers or Toes A crush injury to the fingers or toes means the tissues have been damaged by being squeezed (compressed). There will be bleeding into the tissues and swelling. Often, blood will collect under the skin. When this happens, the skin on the finger often dies and may slough off (shed) 1 week to 10 days later. Usually, new skin is growing underneath. If the injury has been too severe and the tissue does not survive,  the damaged tissue may begin to turn black over several days.  Wounds which occur because of the crushing may be stitched (sutured) shut. However, crush injuries are more likely to become infected than other injuries.These wounds may not be closed as tightly as other types of cuts to prevent infection. Nails involved are often lost. These usually grow back over several weeks.  DIAGNOSIS X-rays may be taken to see if there is any injury to the bones. TREATMENT Broken bones (fractures) may be treated with splinting, depending on the fracture. Often, no treatment is required for fractures of the last bone in the fingers or toes. HOME CARE INSTRUCTIONS   The crushed part should be raised (elevated) above the heart or center of the chest as much as possible for the first several days or as directed. This helps with pain and lessens swelling. Less swelling increases the chances that the crushed part will survive.  Put ice on the injured area.  Put ice in a plastic bag.  Place a towel between your skin and the bag.  Leave the ice on for 15-20 minutes, 03-04 times a day for the first 2 days.  Only take over-the-counter or prescription medicines for pain, discomfort, or fever as directed by your caregiver.  Use your injured part only as directed.  Change your bandages (dressings) as directed.  Keep all follow-up appointments as directed by your caregiver. Not keeping your appointment could result in a chronic or permanent injury, pain, and disability. If there is any problem keeping the appointment, you must call to reschedule. [place]  IF:   There is redness, swelling, or increasing pain in the wound area.  Pus is coming from the wound.  You have a fever.  You notice a bad smell coming from the wound or dressing.  The edges of the wound do not stay together after the sutures have been removed.  You are unable to move the injured finger or toe. MAKE SURE YOU:    Understand these instructions.  Will watch your condition.  Will get help right away if you are not doing well or get worse.   This information is not intended to replace advice given to you by your health care provider. Make sure you discuss any questions you have with your health care provider.   Document Released: 05/05/2005 Document Revised: 07/28/2011 Document Reviewed: 09/20/2010 Elsevier Interactive Patient Education [employer].

## 2015-11-15 ENCOUNTER — Other Ambulatory Visit: Payer: Self-pay | Admitting: *Deleted

## 2015-11-15 MED ORDER — LOSARTAN POTASSIUM 100 MG PO TABS: ORAL_TABLET | ORAL | Status: DC

## 2015-11-25 ENCOUNTER — Encounter: Payer: Self-pay | Admitting: *Deleted

## 2015-11-29 ENCOUNTER — Other Ambulatory Visit: Payer: Self-pay

## 2015-11-29 MED ORDER — LOSARTAN POTASSIUM 100 MG PO TABS: ORAL_TABLET | ORAL | Status: DC

## 2015-12-01 ENCOUNTER — Encounter: Payer: Self-pay | Admitting: Physician Assistant

## 2015-12-02 ENCOUNTER — Other Ambulatory Visit: Payer: Self-pay | Admitting: Internal Medicine

## 2015-12-02 DIAGNOSIS — K219 Gastro-esophageal reflux disease without esophagitis: Secondary | ICD-10-CM

## 2015-12-02 MED ORDER — OMEPRAZOLE 40 MG PO CPDR: DELAYED_RELEASE_CAPSULE | ORAL | Status: DC

## 2015-12-17 ENCOUNTER — Ambulatory Visit (INDEPENDENT_AMBULATORY_CARE_PROVIDER_SITE_OTHER): Payer: Commercial Managed Care - HMO | Admitting: Physician Assistant

## 2015-12-17 ENCOUNTER — Encounter: Payer: Self-pay | Admitting: Physician Assistant

## 2015-12-17 ENCOUNTER — Ambulatory Visit (HOSPITAL_COMMUNITY)
Admission: RE | Admit: 2015-12-17 | Discharge: 2015-12-17 | Disposition: A | Discharge status: Home / Self Care | Payer: Commercial Managed Care - HMO | Source: Ambulatory Visit | Attending: Physician Assistant | Admitting: Physician Assistant

## 2015-12-17 VITALS — BP 112/74 | HR 94 | Temp 97.9°F | Resp 14 | Ht 63.75 in | Wt 155.6 lb

## 2015-12-17 DIAGNOSIS — G5681 Other specified mononeuropathies of right upper limb: Secondary | ICD-10-CM | POA: Diagnosis not present

## 2015-12-17 DIAGNOSIS — R062 Wheezing: Secondary | ICD-10-CM | POA: Insufficient documentation

## 2015-12-17 DIAGNOSIS — M549 Dorsalgia, unspecified: Secondary | ICD-10-CM | POA: Diagnosis not present

## 2015-12-17 DIAGNOSIS — Z124 Encounter for screening for malignant neoplasm of cervix: Secondary | ICD-10-CM

## 2015-12-17 MED ORDER — AMITRIPTYLINE HCL 25 MG PO TABS
ORAL_TABLET | ORAL | 0 refills | Status: DC
Start: 2015-12-17 — End: 2017-03-10

## 2015-12-17 NOTE — Patient Instructions (Signed)
Neuropathic Pain Neuropathic pain is pain caused by damage to the nerves that are responsible for certain sensations in your body (sensory nerves). The pain can be caused by damage to:   The sensory nerves that send signals to your spinal cord and brain (peripheral nervous system).  The sensory nerves in your brain or spinal cord (central nervous system). Neuropathic pain can make you more sensitive to pain. What would be a minor sensation for most people may feel very painful if you have neuropathic pain. This is usually a long-term condition that can be difficult to treat. The type of pain can differ from person to person. It may start suddenly (acute), or it may develop slowly and last for a long time (chronic). Neuropathic pain may come and go as damaged nerves heal or may stay at the same level for years. It often causes emotional distress, loss of sleep, and a lower quality of life. CAUSES  The most common cause of damage to a sensory nerve is diabetes. Many other diseases and conditions can also cause neuropathic pain. Causes of neuropathic pain can be classified as:  Toxic. Many drugs and chemicals can cause toxic damage. The most common cause of toxic neuropathic pain is damage from drug treatment for cancer (chemotherapy).  Metabolic. This type of pain can happen when a disease causes imbalances that damage nerves. Diabetes is the most common of these diseases. Vitamin B deficiency caused by long-term alcohol abuse is another common cause.  Traumatic. Any injury that cuts, crushes, or stretches a nerve can cause damage and pain. A common example is feeling pain after losing an arm or leg (phantom limb pain).  Compression-related. If a sensory nerve gets trapped or compressed for a long period of time, the blood supply to the nerve can be cut off.  Vascular. Many blood vessel diseases can cause neuropathic pain by decreasing blood supply and oxygen to nerves.  Autoimmune. This type of  pain results from diseases in which the body's defense system mistakenly attacks sensory nerves. Examples of autoimmune diseases that can cause neuropathic pain include lupus and multiple sclerosis.  Infectious. Many types of viral infections can damage sensory nerves and cause pain. Shingles infection is a common cause of this type of pain.  Inherited. Neuropathic pain can be a symptom of many diseases that are passed down through families (genetic). SIGNS AND SYMPTOMS  The main symptom is pain. Neuropathic pain is often described as:  Burning.  Shock-like.  Stinging.  Hot or cold.  Itching. DIAGNOSIS  No single test can diagnose neuropathic pain. Your health care provider will do a physical exam and ask you about your pain. You may use a pain scale to describe how bad your pain is. You may also have tests to see if you have a high sensitivity to pain and to help find the cause and location of any sensory nerve damage. These tests may include:  Imaging studies, such as:  X-rays.  CT scan.  MRI.  Nerve conduction studies to test how well nerve signals travel through your sensory nerves (electrodiagnostic testing).  Stimulating your sensory nerves through electrodes on your skin and measuring the response in your spinal cord and brain (somatosensory evoked potentials). TREATMENT  Treatment for neuropathic pain may change over time. You may need to try different treatment options or a combination of treatments. Some options include:  Over-the-counter pain relievers.  Prescription medicines. Some medicines used to treat other conditions may also help neuropathic pain. These   include medicines to:  Control seizures (anticonvulsants).  Relieve depression (antidepressants).  Prescription-strength pain relievers (narcotics). These are usually used when other pain relievers do not help.  Transcutaneous nerve stimulation (TENS). This uses electrical currents to block painful nerve  signals. The treatment is painless.  Topical and local anesthetics. These are medicines that numb the nerves. They can be injected as a nerve block or applied to the skin.  Alternative treatments, such as:  Acupuncture.  Meditation.  Massage.  Physical therapy.  Pain management programs.  Counseling. HOME CARE INSTRUCTIONS  Learn as much as you can about your condition.  Take medicines only as directed by your health care provider.  Work closely with all your health care providers to find what works best for you.  Have a good support system at home.  Consider joining a chronic pain support group. SEEK MEDICAL CARE IF:  Your pain treatments are not helping.  You are having side effects from your medicines.  You are struggling with fatigue, mood changes, depression, or anxiety.   This information is not intended to replace advice given to you by your health care provider. Make sure you discuss any questions you have with your health care provider.   Document Released: 01/31/2004 Document Revised: 05/26/2014 Document Reviewed: 10/13/2013 Elsevier Interactive Patient Education 2016 Elsevier Inc.  

## 2015-12-17 NOTE — Progress Notes (Signed)
Subjective:    Patient ID: Mindy Richard, female    DOB: 1956/03/23, 60 y.o.   MRN: [id number]  HPI 60 y.o. WF with history of HTN, chol, DM 2 with neuropathy presents with right shoulder/thoracic pain x 6 months, no injury. Was treated for GERD, had EGD recently and had normal stress test 2013. Constant 2 pain, "fills like a fist pressing with occ occ 7-8 sharp pain, heat does not help, movement does not make it better or worse, mobic does not help, prilosec 2 a day states it did not hurt as bad. .   Blood pressure 112/74, pulse 94, temperature 97.9 F (36.6 C), temperature source Temporal, resp. rate 14, height 5' 3.75" (1.619 m), weight 155 lb 9.6 oz (70.6 kg), SpO2 97 %.  Medications Current Outpatient Prescriptions on File Prior to Visit  Medication Sig  . acetaminophen (TYLENOL) 500 MG tablet Take 1,000 mg by mouth every 6 (six) hours as needed. For pain  . B-D ULTRAFINE III SHORT PEN 31G X 8 MM MISC USE AS DIRECTED  WITH  LANTUS  INJECTIONS  . buPROPion (WELLBUTRIN XL) 300 MG 24 hr tablet   . cholecalciferol (VITAMIN D) 1000 UNITS tablet Take 2,000 Units by mouth daily.   . diclofenac sodium (VOLTAREN) 1 % GEL Apply 2 g topically 4 (four) times daily.  Marland Kitchen glipiZIDE (GLUCOTROL) 5 MG tablet TAKE 1 TABLET EVERY DAY BEFORE BREAKFAST  . Insulin Glargine (LANTUS SOLOSTAR) 100 UNIT/ML Solostar Pen Inject 8 units daily AD or AD by PCP (Patient taking differently: Inject 12 units daily AD or AD by PCP)  . KOMBIGLYZE XR 2.09-998 MG TB24 TAKE 1 TABLET TWICE DAILY WITH A MEAL  . losartan (COZAAR) 100 MG tablet Take 1/2 to 1 tablet daily as directed for BP & Kidney Protection  . meloxicam (MOBIC) 7.5 MG tablet Take 1 tablet (7.5 mg total) by mouth 2 (two) times daily as needed for pain.  Marland Kitchen omeprazole (PRILOSEC) 40 MG capsule Take 1 capsule 2  X /day for acid reflux & indigestion  . pravastatin (PRAVACHOL) 40 MG tablet TAKE 1 TABLET EVERY DAY  . ranitidine (ZANTAC) 300 MG tablet Take 300 mg by  mouth at bedtime. prn  . venlafaxine XR (EFFEXOR-XR) 75 MG 24 hr capsule   . Vitamins A & D 5000-400 UNITS CAPS   . zolpidem (AMBIEN) 10 MG tablet Take 10 mg by mouth at bedtime.    No current facility-administered medications on file prior to visit.     Problem list She has Hypertension; Depression, major, in remission ([place]); GERD; Vitamin D deficiency; Medication management; Hyperlipidemia; Insulin-requiring or dependent type II diabetes mellitus ([place]); Medicare annual wellness visit, initial; Neuropathy, diabetic ([place]); and OSA (obstructive sleep apnea) on her problem list.  Review of Systems  Constitutional: Negative.   HENT: Negative.   Eyes: Negative.   Respiratory: Negative.   Cardiovascular: Negative.   Gastrointestinal: Negative.   Genitourinary: Negative.   Musculoskeletal: Positive for back pain.  Skin: Negative.   Neurological: Negative.   Psychiatric/Behavioral: Negative.        Objective:   Physical Exam  Constitutional: She is oriented to person, place, and time. She appears well-developed and well-nourished.  HENT:  Head: Normocephalic and atraumatic.  Right Ear: External ear normal.  Left Ear: External ear normal.  Mouth/Throat: Oropharynx is clear and moist.  Eyes: Conjunctivae and EOM are normal. Pupils are equal, round, and reactive to light.  Neck: Normal range of motion. Neck supple. No thyromegaly  present.  Cardiovascular: Normal rate, regular rhythm and normal heart sounds.  Exam reveals no gallop and no friction rub.   No murmur heard. Pulmonary/Chest: Effort normal and breath sounds normal. No respiratory distress. She has no wheezes.  Abdominal: Soft. Bowel sounds are normal. She exhibits no distension and no mass. There is no tenderness. There is no rebound and no guarding.  Musculoskeletal: Normal range of motion. She exhibits tenderness (mild tenderness to palpation right scapula, no pain with rotation/movement. ). She exhibits no deformity.   Lymphadenopathy:    She has no cervical adenopathy.  Neurological: She is alert and oriented to person, place, and time. She displays normal reflexes. No cranial nerve deficit. Coordination normal.  Skin: Skin is warm and dry. No rash (no rash) noted.  Psychiatric: She has a normal mood and affect.       Assessment & Plan:  1. 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 Wheeze Rule out pulmonary cause - DG Chest 2 View; Future

## 2016-01-10 ENCOUNTER — Encounter: Payer: Self-pay | Admitting: *Deleted

## 2016-02-04 ENCOUNTER — Ambulatory Visit (INDEPENDENT_AMBULATORY_CARE_PROVIDER_SITE_OTHER): Payer: Commercial Managed Care - HMO | Admitting: Internal Medicine

## 2016-02-04 ENCOUNTER — Other Ambulatory Visit: Payer: Self-pay | Admitting: *Deleted

## 2016-02-04 ENCOUNTER — Encounter: Payer: Self-pay | Admitting: Internal Medicine

## 2016-02-04 VITALS — BP 122/80 | HR 76 | Temp 97.6°F | Resp 16 | Ht 64.0 in | Wt 157.0 lb

## 2016-02-04 DIAGNOSIS — E119 Type 2 diabetes mellitus without complications: Secondary | ICD-10-CM | POA: Diagnosis not present

## 2016-02-04 DIAGNOSIS — Z79899 Other long term (current) drug therapy: Secondary | ICD-10-CM

## 2016-02-04 DIAGNOSIS — I10 Essential (primary) hypertension: Secondary | ICD-10-CM

## 2016-02-04 DIAGNOSIS — F329 Major depressive disorder, single episode, unspecified: Secondary | ICD-10-CM

## 2016-02-04 DIAGNOSIS — Z794 Long term (current) use of insulin: Secondary | ICD-10-CM | POA: Diagnosis not present

## 2016-02-04 DIAGNOSIS — Z0001 Encounter for general adult medical examination with abnormal findings: Secondary | ICD-10-CM

## 2016-02-04 DIAGNOSIS — E559 Vitamin D deficiency, unspecified: Secondary | ICD-10-CM | POA: Diagnosis not present

## 2016-02-04 DIAGNOSIS — E782 Mixed hyperlipidemia: Secondary | ICD-10-CM | POA: Diagnosis not present

## 2016-02-04 DIAGNOSIS — Z136 Encounter for screening for cardiovascular disorders: Secondary | ICD-10-CM | POA: Diagnosis not present

## 2016-02-04 DIAGNOSIS — Z1212 Encounter for screening for malignant neoplasm of rectum: Secondary | ICD-10-CM

## 2016-02-04 DIAGNOSIS — G5602 Carpal tunnel syndrome, left upper limb: Secondary | ICD-10-CM

## 2016-02-04 DIAGNOSIS — K2100 Gastro-esophageal reflux disease with esophagitis, without bleeding: Secondary | ICD-10-CM

## 2016-02-04 DIAGNOSIS — F32A Depression, unspecified: Secondary | ICD-10-CM

## 2016-02-04 DIAGNOSIS — R6889 Other general symptoms and signs: Secondary | ICD-10-CM

## 2016-02-04 LAB — CBC WITH DIFFERENTIAL/PLATELET
BASOS PCT: 0 %
Basophils Absolute: 0 cells/uL (ref 0–200)
Eosinophils Absolute: 630 cells/uL — ABNORMAL HIGH (ref 15–500)
Eosinophils Relative: 7 %
HEMATOCRIT: 36.8 % (ref 35.0–45.0)
Hemoglobin: 12.3 g/dL (ref 11.7–15.5)
LYMPHS PCT: 28 %
Lymphs Abs: 2520 cells/uL (ref 850–3900)
MCH: 27.4 pg (ref 27.0–33.0)
MCHC: 33.4 g/dL (ref 32.0–36.0)
MCV: 82 fL (ref 80.0–100.0)
MONO ABS: 630 {cells}/uL (ref 200–950)
MONOS PCT: 7 %
MPV: 9.6 fL (ref 7.5–12.5)
Neutro Abs: 5220 cells/uL (ref 1500–7800)
Neutrophils Relative %: 58 %
PLATELETS: 402 10*3/uL — AB (ref 140–400)
RBC: 4.49 MIL/uL (ref 3.80–5.10)
RDW: 14.4 % (ref 11.0–15.0)
WBC: 9 10*3/uL (ref 3.8–10.8)

## 2016-02-04 LAB — TSH: TSH: 2.64 mIU/L

## 2016-02-04 MED ORDER — RANITIDINE HCL 300 MG PO CAPS: 300.0000 mg | ORAL_CAPSULE | Freq: Every evening | ORAL | 1 refills | Status: DC

## 2016-02-04 MED ORDER — DICLOFENAC SODIUM 1 % TD GEL: 2.0000 g | Freq: Four times a day (QID) | TRANSDERMAL | 2 refills | Status: AC

## 2016-02-04 NOTE — Patient Instructions (Signed)
Preventive Care for Adults  A healthy lifestyle and preventive care can promote health and wellness. Preventive health guidelines for women include the following key practices.  A routine yearly physical is a good way to check with your health care provider about your health and preventive screening. It is a chance to share any concerns and updates on your health and to receive a thorough exam.  Visit your dentist for a routine exam and preventive care every 6 months. Brush your teeth twice a day and floss once a day. Good oral hygiene prevents tooth decay and gum disease.  The frequency of eye exams is based on your age, health, family medical history, use of contact lenses, and other factors. Follow your health care provider's recommendations for frequency of eye exams.  Eat a healthy diet. Foods like vegetables, fruits, whole grains, low-fat dairy products, and lean protein foods contain the nutrients you need without too many calories. Decrease your intake of foods high in solid fats, added sugars, and salt. Eat the right amount of calories for you.Get information about a proper diet from your health care provider, if necessary.  Regular physical exercise is one of the most important things you can do for your health. Most adults should get at least 150 minutes of moderate-intensity exercise (any activity that increases your heart rate and causes you to sweat) each week. In addition, most adults need muscle-strengthening exercises on 2 or more days a week.  Maintain a healthy weight. The body mass index (BMI) is a screening tool to identify possible weight problems. It provides an estimate of body fat based on height and weight. Your health care provider can find your BMI and can help you achieve or maintain a healthy weight.For adults 20 years and older:  A BMI below 18.5 is considered underweight.  A BMI of 18.5 to 24.9 is normal.  A BMI of 25 to 29.9 is considered overweight.  A BMI of  30 and above is considered obese.  Maintain normal blood lipids and cholesterol levels by exercising and minimizing your intake of saturated fat. Eat a balanced diet with plenty of fruit and vegetables. Blood tests for lipids and cholesterol should begin at age 20 and be repeated every 5 years. If your lipid or cholesterol levels are high, you are over 50, or you are at high risk for heart disease, you may need your cholesterol levels checked more frequently.Ongoing high lipid and cholesterol levels should be treated with medicines if diet and exercise are not working.  If you smoke, find out from your health care provider how to quit. If you do not use tobacco, do not start.  Lung cancer screening is recommended for adults aged 55-80 years who are at high risk for developing lung cancer because of a history of smoking. A yearly low-dose CT scan of the lungs is recommended for people who have at least a 30-pack-year history of smoking and are a current smoker or have quit within the past 15 years. A pack year of smoking is smoking an average of 1 pack of cigarettes a day for 1 year (for example: 1 pack a day for 30 years or 2 packs a day for 15 years). Yearly screening should continue until the smoker has stopped smoking for at least 15 years. Yearly screening should be stopped for people who develop a health problem that would prevent them from having lung cancer treatment.  High blood pressure causes heart disease and increases the risk of   stroke. Your blood pressure should be checked at least every 1 to 2 years. Ongoing high blood pressure should be treated with medicines if weight loss and exercise do not work.  If you are 55-79 years old, ask your health care provider if you should take aspirin to prevent strokes.  Diabetes screening involves taking a blood sample to check your fasting blood sugar level. This should be done once every 3 years, after age 45, if you are within normal weight and  without risk factors for diabetes. Testing should be considered at a younger age or be carried out more frequently if you are overweight and have at least 1 risk factor for diabetes.  Breast cancer screening is essential preventive care for women. You should practice "breast self-awareness." This means understanding the normal appearance and feel of your breasts and may include breast self-examination. Any changes detected, no matter how small, should be reported to a health care provider. Women in their 20s and 30s should have a clinical breast exam (CBE) by a health care provider as part of a regular health exam every 1 to 3 years. After age 40, women should have a CBE every year. Starting at age 40, women should consider having a mammogram (breast X-ray test) every year. Women who have a family history of breast cancer should talk to their health care provider about genetic screening. Women at a high risk of breast cancer should talk to their health care providers about having an MRI and a mammogram every year.  Breast cancer gene (BRCA)-related cancer risk assessment is recommended for women who have family members with BRCA-related cancers. BRCA-related cancers include breast, ovarian, tubal, and peritoneal cancers. Having family members with these cancers may be associated with an increased risk for harmful changes (mutations) in the breast cancer genes BRCA1 and BRCA2. Results of the assessment will determine the need for genetic counseling and BRCA1 and BRCA2 testing.  Routine pelvic exams to screen for cancer are no longer recommended for nonpregnant women who are considered low risk for cancer of the pelvic organs (ovaries, uterus, and vagina) and who do not have symptoms. Ask your health care provider if a screening pelvic exam is right for you.  If you have had past treatment for cervical cancer or a condition that could lead to cancer, you need Pap tests and screening for cancer for at least 20  years after your treatment. If Pap tests have been discontinued, your risk factors (such as having a new sexual partner) need to be reassessed to determine if screening should be resumed. Some women have medical problems that increase the chance of getting cervical cancer. In these cases, your health care provider may recommend more frequent screening and Pap tests.  Colorectal cancer can be detected and often prevented. Most routine colorectal cancer screening begins at the age of 50 years and continues through age 75 years. However, your health care provider may recommend screening at an earlier age if you have risk factors for colon cancer. On a yearly basis, your health care provider may provide home test kits to check for hidden blood in the stool. Use of a small camera at the end of a tube, to directly examine the colon (sigmoidoscopy or colonoscopy), can detect the earliest forms of colorectal cancer. Talk to your health care provider about this at age 50, when routine screening begins. Direct exam of the colon should be repeated every 5-10 years through age 75 years, unless early forms of pre-cancerous   polyps or small growths are found.  Hepatitis C blood testing is recommended for all people born from 1945 through 1965 and any individual with known risks for hepatitis C.  Pra  Osteoporosis is a disease in which the bones lose minerals and strength with aging. This can result in serious bone fractures or breaks. The risk of osteoporosis can be identified using a bone density scan. Women ages 65 years and over and women at risk for fractures or osteoporosis should discuss screening with their health care providers. Ask your health care provider whether you should take a calcium supplement or vitamin D to reduce the rate of osteoporosis.  Menopause can be associated with physical symptoms and risks. Hormone replacement therapy is available to decrease symptoms and risks. You should talk to your  health care provider about whether hormone replacement therapy is right for you.  Use sunscreen. Apply sunscreen liberally and repeatedly throughout the day. You should seek shade when your shadow is shorter than you. Protect yourself by wearing long sleeves, pants, a wide-brimmed hat, and sunglasses year round, whenever you are outdoors.  Once a month, do a whole body skin exam, using a mirror to look at the skin on your back. Tell your health care provider of new moles, moles that have irregular borders, moles that are larger than a pencil eraser, or moles that have changed in shape or color.  Stay current with required vaccines (immunizations).  Influenza vaccine. All adults should be immunized every year.  Tetanus, diphtheria, and acellular pertussis (Td, Tdap) vaccine. Pregnant women should receive 1 dose of Tdap vaccine during each pregnancy. The dose should be obtained regardless of the length of time since the last dose. Immunization is preferred during the 27th-36th week of gestation. An adult who has not previously received Tdap or who does not know her vaccine status should receive 1 dose of Tdap. This initial dose should be followed by tetanus and diphtheria toxoids (Td) booster doses every 10 years. Adults with an unknown or incomplete history of completing a 3-dose immunization series with Td-containing vaccines should begin or complete a primary immunization series including a Tdap dose. Adults should receive a Td booster every 10 years.  Varicella vaccine. An adult without evidence of immunity to varicella should receive 2 doses or a second dose if she has previously received 1 dose. Pregnant females who do not have evidence of immunity should receive the first dose after pregnancy. This first dose should be obtained before leaving the health care facility. The second dose should be obtained 4-8 weeks after the first dose.  Human papillomavirus (HPV) vaccine. Females aged 13-26 years  who have not received the vaccine previously should obtain the 3-dose series. The vaccine is not recommended for use in pregnant females. However, pregnancy testing is not needed before receiving a dose. If a female is found to be pregnant after receiving a dose, no treatment is needed. In that case, the remaining doses should be delayed until after the pregnancy. Immunization is recommended for any person with an immunocompromised condition through the age of 26 years if she did not get any or all doses earlier. During the 3-dose series, the second dose should be obtained 4-8 weeks after the first dose. The third dose should be obtained 24 weeks after the first dose and 16 weeks after the second dose.  Zoster vaccine. One dose is recommended for adults aged 60 years or older unless certain conditions are present.  Measles, mumps, and rubella (  MMR) vaccine. Adults born before [birth year] generally are considered immune to measles and mumps. Adults born in [birth year] or later should have 1 or more doses of MMR vaccine unless there is a contraindication to the vaccine or there is laboratory evidence of immunity to each of the three diseases. A routine second dose of MMR vaccine should be obtained at least 28 days after the first dose for students attending postsecondary schools, health care workers, or international travelers. People who received inactivated measles vaccine or an unknown type of measles vaccine during 1963-1967 should receive 2 doses of MMR vaccine. People who received inactivated mumps vaccine or an unknown type of mumps vaccine before 1979 and are at high risk for mumps infection should consider immunization with 2 doses of MMR vaccine. For females of childbearing age, rubella immunity should be determined. If there is no evidence of immunity, females who are not pregnant should be vaccinated. If there is no evidence of immunity, females who are pregnant should delay immunization until after pregnancy.  Unvaccinated health care workers born before [birth year] who lack laboratory evidence of measles, mumps, or rubella immunity or laboratory confirmation of disease should consider measles and mumps immunization with 2 doses of MMR vaccine or rubella immunization with 1 dose of MMR vaccine.  Pneumococcal 13-valent conjugate (PCV13) vaccine. When indicated, a person who is uncertain of her immunization history and has no record of immunization should receive the PCV13 vaccine. An adult aged 39 years or older who has certain medical conditions and has not been previously immunized should receive 1 dose of PCV13 vaccine. This PCV13 should be followed with a dose of pneumococcal polysaccharide (PPSV23) vaccine. The PPSV23 vaccine dose should be obtained at least 8 weeks after the dose of PCV13 vaccine. An adult aged 62 years or older who has certain medical conditions and previously received 1 or more doses of PPSV23 vaccine should receive 1 dose of PCV13. The PCV13 vaccine dose should be obtained 1 or more years after the last PPSV23 vaccine dose.    Pneumococcal polysaccharide (PPSV23) vaccine. When PCV13 is also indicated, PCV13 should be obtained first. All adults aged 67 years and older should be immunized. An adult younger than age 45 years who has certain medical conditions should be immunized. Any person who resides in a nursing home or long-term care facility should be immunized. An adult smoker should be immunized. People with an immunocompromised condition and certain other conditions should receive both PCV13 and PPSV23 vaccines. People with human immunodeficiency virus (HIV) infection should be immunized as soon as possible after diagnosis. Immunization during chemotherapy or radiation therapy should be avoided. Routine use of PPSV23 vaccine is not recommended for American Indians, [place] Natives, or people younger than 65 years unless there are medical conditions that require PPSV23 vaccine. When indicated,  people who have unknown immunization and have no record of immunization should receive PPSV23 vaccine. One-time revaccination 5 years after the first dose of PPSV23 is recommended for people aged 19-64 years who have chronic kidney failure, nephrotic syndrome, asplenia, or immunocompromised conditions. People who received 1-2 doses of PPSV23 before age 23 years should receive another dose of PPSV23 vaccine at age 35 years or later if at least 5 years have passed since the previous dose. Doses of PPSV23 are not needed for people immunized with PPSV23 at or after age 38 years.  Preventive Services / Frequency   Ages 43 to 86 years  Blood pressure check.  Lipid and cholesterol check.  Lung  cancer screening. / Every year if you are aged 55-80 years and have a 30-pack-year history of smoking and currently smoke or have quit within the past 15 years. Yearly screening is stopped once you have quit smoking for at least 15 years or develop a health problem that would prevent you from having lung cancer treatment.  Clinical breast exam.** / Every year after age 40 years.  BRCA-related cancer risk assessment.** / For women who have family members with a BRCA-related cancer (breast, ovarian, tubal, or peritoneal cancers).  Mammogram.** / Every year beginning at age 40 years and continuing for as long as you are in good health. Consult with your health care provider.  Pap test.** / Every 3 years starting at age 30 years through age 65 or 70 years with a history of 3 consecutive normal Pap tests.  HPV screening.** / Every 3 years from ages 30 years through ages 65 to 70 years with a history of 3 consecutive normal Pap tests.  Fecal occult blood test (FOBT) of stool. / Every year beginning at age 50 years and continuing until age 75 years. You may not need to do this test if you get a colonoscopy every 10 years.  Flexible sigmoidoscopy or colonoscopy.** / Every 5 years for a flexible sigmoidoscopy or  every 10 years for a colonoscopy beginning at age 50 years and continuing until age 75 years.  Hepatitis C blood test.** / For all people born from 1945 through 1965 and any individual with known risks for hepatitis C.  Skin self-exam. / Monthly.  Influenza vaccine. / Every year.  Tetanus, diphtheria, and acellular pertussis (Tdap/Td) vaccine.** / Consult your health care provider. Pregnant women should receive 1 dose of Tdap vaccine during each pregnancy. 1 dose of Td every 10 years.  Varicella vaccine.** / Consult your health care provider. Pregnant females who do not have evidence of immunity should receive the first dose after pregnancy.  Zoster vaccine.** / 1 dose for adults aged 60 years or older.  Pneumococcal 13-valent conjugate (PCV13) vaccine.** / Consult your health care provider.  Pneumococcal polysaccharide (PPSV23) vaccine.** / 1 to 2 doses if you smoke cigarettes or if you have certain conditions.  Meningococcal vaccine.** / Consult your health care provider.  Hepatitis A vaccine.** / Consult your health care provider.  Hepatitis B vaccine.** / Consult your health care provider. Screening for abdominal aortic aneurysm (AAA)  by ultrasound is recommended for people over 50 who have history of high blood pressure or who are current or former smokers. ++++++++++++++++++ Recommend Adult Low Dose Aspirin or  coated  Aspirin 81 mg daily  To reduce risk of Colon Cancer 20 %,  Skin Cancer 26 % ,  Melanoma 46%  and  Pancreatic cancer 60% +++++++++++++++++++ Vitamin D goal  is between 70-100.  Please make sure that you are taking your Vitamin D as directed.  It is very important as a natural anti-inflammatory  helping hair, skin, and nails, as well as reducing stroke and heart attack risk.  It helps your bones and helps with mood. It also decreases numerous cancer risks so please take it as directed.  Low Vit D is associated with a 200-300% higher risk for CANCER  and  200-300% higher risk for HEART   ATTACK  &  STROKE.   ...................................... It is also associated with higher death rate at younger ages,  autoimmune diseases like Rheumatoid arthritis, Lupus, Multiple Sclerosis.    Also many other serious conditions, like depression,   Alzheimer's Dementia, infertility, muscle aches, fatigue, fibromyalgia - just to name a few. ++++++++++++++++++ Recommend the book "The END of DIETING" by Dr Joel Fuhrman  & the book "The END of DIABETES " by Dr Joel Fuhrman At [employer] - get book & Audio CD's    Being diabetic has a  300% increased risk for heart attack, stroke, cancer, and alzheimer- type vascular dementia. It is very important that you work harder with diet by avoiding all foods that are white. Avoid white rice (brown & wild rice is OK), white potatoes (sweetpotatoes in moderation is OK), White bread or wheat bread or anything made out of white flour like bagels, donuts, rolls, buns, biscuits, cakes, pastries, cookies, pizza crust, and pasta (made from white flour & egg whites) - vegetarian pasta or spinach or wheat pasta is OK. Multigrain breads like Arnold's or Pepper[employer], or multigrain sandwich thins or flatbreads.  Diet, exercise and weight loss can reverse and cure diabetes in the early stages.  Diet, exercise and weight loss is very important in the control and prevention of complications of diabetes which affects every system in your body, ie. Brain - dementia/stroke, eyes - glaucoma/blindness, heart - heart attack/heart failure, kidneys - dialysis, stomach - gastric paralysis, intestines - malabsorption, nerves - severe painful neuritis, circulation - gangrene & loss of a leg(s), and finally cancer and Alzheimers.    I recommend avoid fried & greasy foods,  sweets/candy, white rice (brown or wild rice or Quinoa is OK), white potatoes (sweet potatoes are OK) - anything made from white flour - bagels, doughnuts, rolls, buns, biscuits,white  and wheat breads, pizza crust and traditional pasta made of white flour & egg white(vegetarian pasta or spinach or wheat pasta is OK).  Multi-grain bread is OK - like multi-grain flat bread or sandwich thins. Avoid alcohol in excess. Exercise is also important.    Eat all the vegetables you want - avoid meat, especially red meat and dairy - especially cheese.  Cheese is the most concentrated form of trans-fats which is the worst thing to clog up our arteries. Veggie cheese is OK which can be found in the fresh produce section at [employer] or [employer] or [employer]  ++++++++++++++++++++++ DASH Eating Plan  DASH stands for "Dietary Approaches to Stop Hypertension."   The DASH eating plan is a healthy eating plan that has been shown to reduce high blood pressure (hypertension). Additional health benefits may include reducing the risk of type 2 diabetes mellitus, heart disease, and stroke. The DASH eating plan may also help with weight loss. WHAT DO I NEED TO KNOW ABOUT THE DASH EATING PLAN? For the DASH eating plan, you will follow these general guidelines:  Choose foods with a percent daily value for sodium of less than 5% (as listed on the food label).  Use salt-free seasonings or herbs instead of table salt or sea salt.  Check with your health care provider or pharmacist before using salt substitutes.  Eat lower-sodium products, often labeled as "lower sodium" or "no salt added."  Eat fresh foods.  Eat more vegetables, fruits, and low-fat dairy products.  Choose whole grains. Look for the word "whole" as the first word in the ingredient list.  Choose fish   Limit sweets, desserts, sugars, and sugary drinks.  Choose heart-healthy fats.  Eat veggie cheese   Eat more home-cooked food and less restaurant, buffet, and fast food.  Limit fried foods.  Cook foods using methods other than frying.  Limit canned   canned vegetables. If you do use them, rinse them well to  decrease the sodium.  When eating at a restaurant, ask that your food be prepared with less salt, or no salt if possible.                      WHAT FOODS CAN I EAT? Read Dr Fara Olden Fuhrman's books on The End of Dieting & The End of Diabetes  Grains Whole grain or whole wheat bread. Brown rice. Whole grain or whole wheat pasta. Quinoa, bulgur, and whole grain cereals. Low-sodium cereals. Corn or whole wheat flour tortillas. Whole grain cornbread. Whole grain crackers. Low-sodium crackers.  Vegetables Fresh or frozen vegetables (raw, steamed, roasted, or grilled). Low-sodium or reduced-sodium tomato and vegetable juices. Low-sodium or reduced-sodium tomato sauce and paste. Low-sodium or reduced-sodium canned vegetables.   Fruits All fresh, canned (in natural juice), or frozen fruits.  Protein Products  All fish and seafood.  Dried beans, peas, or lentils. Unsalted nuts and seeds. Unsalted canned beans.  Dairy Low-fat dairy products, such as skim or 1% milk, 2% or reduced-fat cheeses, low-fat ricotta or cottage cheese, or plain low-fat yogurt. Low-sodium or reduced-sodium cheeses.  Fats and Oils Tub margarines without trans fats. Light or reduced-fat mayonnaise and salad dressings (reduced sodium). Avocado. Safflower, olive, or canola oils. Natural peanut or almond butter.  Other Unsalted popcorn and pretzels. The items listed above may not be a complete list of recommended foods or beverages. Contact your dietitian for more options.  ++++++++++++++++++++++++  WHAT FOODS ARE NOT RECOMMENDED? Grains/ White flour or wheat flour White bread. White pasta. White rice. Refined cornbread. Bagels and croissants. Crackers that contain trans fat.  Vegetables  Creamed or fried vegetables. Vegetables in a . Regular canned vegetables. Regular canned tomato sauce and paste. Regular tomato and vegetable juices.  Fruits Dried fruits. Canned fruit in light or heavy syrup. Fruit juice.  Meat and  Other Protein Products Meat in general - RED meat & White meat.  Fatty cuts of meat. Ribs, chicken wings, all processed meats as bacon, sausage, bologna, salami, fatback, hot dogs, bratwurst and packaged luncheon meats.  Dairy Whole or 2% milk, cream, half-and-half, and cream cheese. Whole-fat or sweetened yogurt. Full-fat cheeses or blue cheese. Non-dairy creamers and whipped toppings. Processed cheese, cheese spreads, or cheese curds.  Condiments Onion and garlic salt, seasoned salt, table salt, and sea salt. Canned and packaged gravies. Worcestershire sauce. Tartar sauce. Barbecue sauce. Teriyaki sauce. Soy sauce, including reduced sodium. Steak sauce. Fish sauce. Oyster sauce. Cocktail sauce. Horseradish. Ketchup and mustard. Meat flavorings and tenderizers. Bouillon cubes. Hot sauce. Tabasco sauce. Marinades. Taco seasonings. Relishes.  Fats and Oils Butter, stick margarine, lard, shortening and bacon fat. Coconut, palm kernel, or palm oils. Regular salad dressings.  Pickles and olives. Salted popcorn and pretzels.  The items listed above may not be a complete list of foods and beverages to avoid.

## 2016-02-04 NOTE — Progress Notes (Signed)
[facility] Unk Pinto, M.D.    Uvaldo Bristle. Silverio Lay, P.A.-C      Starlyn Skeans, P.A.-C  [facility]                [address] [place] [id number] Telephone [phone] Telefax [phone]  Annual Screening/Preventative Visit & Comprehensive Evaluation &  Examination     This very nice 60 y.o. MWF presents for a Screening/Preventative Visit & comprehensive evaluation and management of multiple medical co-morbidities.  Patient has been followed for HTN, T2_NIDDM  , Hyperlipidemia and Vitamin D Deficiency. Patient also has GERD controlled on maintenance Omeprazole with exacerbation of sx's with all prior attempts to taper meds. Patient has hx/o OSA and was un CPAP which she self d/c'd after 30 # weight loss and w/o reoccurrence of her apnea or snoring. Note patient has been on SS Disability since 1999 for Depression and panic attacks.      HTN predates circa 2000. Patient's BP has been controlled at home and patient denies any cardiac symptoms as chest pain, palpitations, shortness of breath, dizziness or ankle swelling. In 2012 , she had s negative nuclear stress test w/EF 88%. Today's BP is 122/80 - at goal.      Patient's hyperlipidemia is controlled with diet and medications. Patient denies myalgias or other medication SE's. Last lipids were at goal albeit elevated Trig's: Lab Results  Component Value Date   CHOL 195 10/23/2015   HDL 66 10/23/2015   LDLCALC 83 10/23/2015   TRIG 228 (H) 10/23/2015   CHOLHDL 3.0 10/23/2015      Patient has Insulin Requiring T2_DM predating circa 2001 and patient denies reactive hypoglycemic symptoms, visual blurring, diabetic polys,but doe [place]/o paresthesias of her feet. Also reports Left Carpal Tunnel sx's.  Last A1c was not at goal: Lab Results  Component Value Date   HGBA1C 6.9 (H) 10/23/2015      Finally, patient has history of Vitamin D Deficiency of  "7" in 2008and last Vitamin D was at goal: Lab Results  Component Value Date   VD25OH 68 10/23/2015   Current Outpatient Prescriptions on File Prior to Visit  Medication Sig  . Acetaminophen 500 MG tablet Take 1,000 mg  every 6 hours as needed.   Marland Kitchen amitriptyline 25 MG tablet 1/2-2 at night for nerve pain  . buPROPion-XL 300 MG    . VITAMIN D 1000 UNITS  Take 2,000 Units by mouth daily.   Marland Kitchen glipiZIDE (GLUCOTROL) 5 MG  TAKE 1 TABLET EVERY DAY BEFORE BREAKFAST  . LANTUS SOLOSTAR) 100 UNIT/ML  Inject 12 units daily AD   . KOMBIGLYZE XR 2.09-998 MG TB24 TAKE 1 TABLET TWICE DAILY WITH A MEAL  . losartan (COZAAR) 100 MG tablet Take 1/2 to 1 tablet daily   . meloxicam (MOBIC) 7.5 MG tablet Take 1 tab 2 times daily as needed for pain.  Marland Kitchen omeprazole  40 MG capsule Take 1 capsule 2  X /day for acid reflux & indigestion  . pravastatin  40 MG tablet TAKE 1 TABLET EVERY DAY  . venlafaxine XR  75 MG 24 hr    . Vitamins A & D 5000-400 UNITS CAPS   . zolpidem  10 MG tablet Take at bedtime.    Allergies  Allergen Reactions  . Codeine Palpitations and Other (See Comments)    Makes her head feel "weird"  .  Glucophage [Metformin Hcl] Diarrhea  . Nsaids Other (See Comments)    "upset stomach"   Past Medical History:  Diagnosis Date  . Anxiety   . Chest pain 04/2011   saw Dr Ron Parker..not heart related  sent to GI dr. and found gallstones.  . Complication of anesthesia 2001; 20012   "slow to wake up"  . Depression   . GERD (gastroesophageal reflux disease)   . H/O hiatal hernia   . Hypercholesteremia   . Hypertension   . Mental disorder   . Type II diabetes mellitus ([place]) 2003   Health Maintenance  Topic Date Due  . PAP SMEAR  02/27/1977  . MAMMOGRAM  02/27/2006  . INFLUENZA VACCINE  12/18/2015  . OPHTHALMOLOGY EXAM  01/03/2016  . PNEUMOCOCCAL POLYSACCHARIDE VACCINE (2) 07/03/2017 (Originally 01/18/2011)  . HEMOGLOBIN A1C  04/23/2016  . FOOT EXAM  06/03/2016  . COLONOSCOPY  03/06/2021  .  TETANUS/TDAP  08/11/2023  . Hepatitis C Screening  Completed  . HIV Screening  Completed   Immunization History  Administered Date(s) Administered  . DT 08/10/2013  . Influenza Split 04/04/2013, 04/03/2014, 03/08/2015  . Pneumococcal-Unspecified 01/17/2006   Past Surgical History:  Procedure Laterality Date  . CARPAL TUNNEL RELEASE  2001   right  . CESAREAN SECTION  1985  . CHOLECYSTECTOMY  09/02/11  . CHOLECYSTECTOMY  09/02/2011   Procedure: LAPAROSCOPIC CHOLECYSTECTOMY WITH INTRAOPERATIVE CHOLANGIOGRAM;  Surgeon: Haywood Lasso, MD;  Location: [facility];  Service: General;  Laterality: N/A;  . COLONOSCOPY    . DILATION AND CURETTAGE OF UTERUS  1984  . ERCP  09/03/2011   Procedure: ENDOSCOPIC RETROGRADE CHOLANGIOPANCREATOGRAPHY (ERCP);  Surgeon: Missy Sabins, MD;  Location: [facility] ENDOSCOPY;  Service: Endoscopy;  Laterality: N/A;  sphincterotomy and stone extraction  . SHOULDER ARTHROSCOPY W/ ROTATOR CUFF REPAIR  2012   left  . TONSILLECTOMY  ~ 1970  . TUBAL LIGATION  1985?   Family History  Problem Relation Age of Onset  . Cancer Maternal Aunt     breast  . Anesthesia problems Neg Hx   . Hypotension Neg Hx   . Heart attack Father 78    Smoker  . Diabetes Mother   . Hyperlipidemia Mother    Social History  Substance Use Topics  . Smoking status: Never Smoker  . Smokeless tobacco: Never Used  . Alcohol use No    ROS Constitutional: Denies fever, chills, weight loss/gain, headaches, insomnia,  night sweats, and change in appetite. Does c/o fatigue. Eyes: Denies redness, blurred vision, diplopia, discharge, itchy, watery eyes.  ENT: Denies discharge, congestion, post nasal drip, epistaxis, sore throat, earache, hearing loss, dental pain, Tinnitus, Vertigo, Sinus pain, snoring.  Cardio: Denies chest pain, palpitations, irregular heartbeat, syncope, dyspnea, diaphoresis, orthopnea, PND, claudication, edema Respiratory: denies cough, dyspnea, DOE, pleurisy, hoarseness,  laryngitis, wheezing.  Gastrointestinal: Denies dysphagia, heartburn, reflux, water brash, pain, cramps, nausea, vomiting, bloating, diarrhea, constipation, hematemesis, melena, hematochezia, jaundice, hemorrhoids Genitourinary: Denies dysuria, frequency, urgency, nocturia, hesitancy, discharge, hematuria, flank pain Breast: Breast lumps, nipple discharge, bleeding.  Musculoskeletal: Denies arthralgia, myalgia, stiffness, Jt. Swelling, pain, limp, and strain/sprain. Denies falls. Skin: Denies puritis, rash, hives, warts, acne, eczema, changing in skin lesion Neuro: No weakness, tremor, incoordination, spasms, paresthesia, pain Psychiatric: Denies confusion, memory loss, sensory loss. Denies Depression. Endocrine: Denies change in weight, skin, hair change, nocturia, diabetic polys, visual blurring, hyper / hypo glycemic episodes, but does have paresthesias of her bilat. feet.  Heme/Lymph: No excessive bleeding, bruising, enlarged lymph nodes.  Physical Exam  BP 122/80   Pulse 76   Temp 97.6 F (36.4 C)   Resp 16   Ht 5\' 4"  (1.626 m)   Wt 157 lb (71.2 kg)   BMI 26.95 kg/m   General Appearance: Well nourished and in no apparent distress.  Eyes: PERRLA, EOMs, conjunctiva no swelling or erythema, normal fundi and vessels. Sinuses: No frontal/maxillary tenderness ENT/Mouth: EACs patent / TMs  nl. Nares clear without erythema, swelling, mucoid exudates. Oral hygiene is good. No erythema, swelling, or exudate. Tongue normal, non-obstructing. Tonsils not swollen or erythematous. Hearing normal.  Neck: Supple, thyroid normal. No bruits, nodes or JVD. Respiratory: Respiratory effort normal.  BS equal and clear bilateral without rales, rhonci, wheezing or stridor. Cardio: Heart sounds are normal with regular rate and rhythm and no murmurs, rubs or gallops. Peripheral pulses are normal and equal bilaterally without edema. No aortic or femoral bruits. Chest: symmetric with normal excursions and  percussion. Breasts: Symmetric, without lumps, nipple discharge, retractions, or fibrocystic changes.  Abdomen: Flat, soft with bowel sounds active. Nontender, no guarding, rebound, hernias, masses, or organomegaly.  Lymphatics: Non tender without lymphadenopathy.  Genitourinary:  Musculoskeletal: Full ROM all peripheral extremities, joint stability, 5/5 strength, and normal gait. Skin: Warm and dry without rashes, lesions, cyanosis, clubbing or  ecchymosis.  Neuro: Cranial nerves intact, reflexes equal bilaterally. Normal muscle tone, no cerebellar symptoms. Sensation intact to touch, Vibratory and Monofilament to the toes bilaterally. ?(+) Lt Tinel's.  Pysch: Alert and oriented X 3, normal affect, Insight and Judgment appropriate.   Assessment and Plan  1. Annual Preventative Screening Examination  - Microalbumin / creatinine urine ratio - EKG 12-Lead - POC Hemoccult Bld/Stl  - Urinalysis, Routine w reflex microscopic - HM DIABETES FOOT EXAM - LOW EXTREMITY NEUR EXAM DOCUM - CBC with Differential/Platelet - BASIC METABOLIC PANEL WITH GFR - Hepatic function panel - Magnesium - Lipid panel - TSH - Hemoglobin A1c - VITAMIN D 25 Hydroxy  - Ambulatory referral to Orthopedic Surgery  2. Essential hypertension  - Microalbumin / creatinine urine ratio - EKG 12-Lead - TSH  3. Hyperlipidemia  - Lipid panel - TSH  4. Insulin-requiring or dependent type II diabetes mellitus ([place])  - HM DIABETES FOOT EXAM - LOW EXTREMITY NEUR EXAM DOCUM - Hemoglobin A1c  5. Vitamin D deficiency  - VITAMIN D 25 Hydroxy   6. GERD   7. Depression, controlled   8. Screening for rectal cancer  - POC Hemoccult Bld/Stl   9. Screening for ischemic heart disease   10. Left carpal tunnel syndrome  - Ambulatory referral to Orthopedic Surgery  11. Medication management  - Urinalysis, Routine w reflex microscopic - CBC with Differential/Platelet - BASIC METABOLIC PANEL WITH GFR -  Hepatic function panel - Magnesium      Continue prudent diet as discussed, weight control, BP monitoring, regular exercise, and medications. Discussed med's effects and SE's. Screening labs and tests as requested with regular follow-up as recommended. Over 40 minutes of exam, counseling, chart review and high complex critical decision making was performed.

## 2016-02-05 LAB — VITAMIN D 25 HYDROXY (VIT D DEFICIENCY, FRACTURES): VIT D 25 HYDROXY: 61 ng/mL (ref 30–100)

## 2016-02-05 LAB — URINALYSIS, ROUTINE W REFLEX MICROSCOPIC
Bilirubin Urine: NEGATIVE
Glucose, UA: NEGATIVE
HGB URINE DIPSTICK: NEGATIVE
Ketones, ur: NEGATIVE
LEUKOCYTES UA: NEGATIVE
Nitrite: NEGATIVE
PH: 5.5 (ref 5.0–8.0)
Protein, ur: NEGATIVE
SPECIFIC GRAVITY, URINE: 1.014 (ref 1.001–1.035)

## 2016-02-05 LAB — MAGNESIUM: Magnesium: 2.1 mg/dL (ref 1.5–2.5)

## 2016-02-05 LAB — HEPATIC FUNCTION PANEL
ALK PHOS: 72 U/L (ref 33–130)
ALT: 13 U/L (ref 6–29)
AST: 15 U/L (ref 10–35)
Albumin: 4.3 g/dL (ref 3.6–5.1)
BILIRUBIN INDIRECT: 0.2 mg/dL (ref 0.2–1.2)
BILIRUBIN TOTAL: 0.3 mg/dL (ref 0.2–1.2)
Bilirubin, Direct: 0.1 mg/dL (ref <=0.2)
Total Protein: 7.1 g/dL (ref 6.1–8.1)

## 2016-02-05 LAB — MICROALBUMIN / CREATININE URINE RATIO
Creatinine, Urine: 79 mg/dL (ref 20–320)
MICROALB UR: 0.4 mg/dL
Microalb Creat Ratio: 5 mcg/mg creat (ref <30)

## 2016-02-05 LAB — BASIC METABOLIC PANEL WITH GFR
BUN: 9 mg/dL (ref 7–25)
CO2: 25 mmol/L (ref 20–31)
Calcium: 9.6 mg/dL (ref 8.6–10.4)
Chloride: 100 mmol/L (ref 98–110)
Creat: 0.6 mg/dL (ref 0.50–1.05)
Glucose, Bld: 140 mg/dL — ABNORMAL HIGH (ref 65–99)
POTASSIUM: 4.6 mmol/L (ref 3.5–5.3)
Sodium: 138 mmol/L (ref 135–146)

## 2016-02-05 LAB — LIPID PANEL
Cholesterol: 199 mg/dL (ref 125–200)
HDL: 65 mg/dL (ref >=46)
LDL CALC: 84 mg/dL (ref <130)
TRIGLYCERIDES: 251 mg/dL — AB (ref <150)
Total CHOL/HDL Ratio: 3.1 Ratio (ref <=5.0)
VLDL: 50 mg/dL — AB (ref <30)

## 2016-02-05 LAB — HEMOGLOBIN A1C
Hgb A1c MFr Bld: 6.8 % — ABNORMAL HIGH (ref <5.7)
Mean Plasma Glucose: 148 mg/dL

## 2016-02-12 DIAGNOSIS — K219 Gastro-esophageal reflux disease without esophagitis: Secondary | ICD-10-CM | POA: Diagnosis not present

## 2016-02-27 DIAGNOSIS — Z01419 Encounter for gynecological examination (general) (routine) without abnormal findings: Secondary | ICD-10-CM | POA: Diagnosis not present

## 2016-02-27 DIAGNOSIS — Z6827 Body mass index (BMI) 27.0-27.9, adult: Secondary | ICD-10-CM | POA: Diagnosis not present

## 2016-02-27 DIAGNOSIS — Z1231 Encounter for screening mammogram for malignant neoplasm of breast: Secondary | ICD-10-CM | POA: Diagnosis not present

## 2016-03-11 DIAGNOSIS — H5213 Myopia, bilateral: Secondary | ICD-10-CM | POA: Diagnosis not present

## 2016-03-11 DIAGNOSIS — E119 Type 2 diabetes mellitus without complications: Secondary | ICD-10-CM | POA: Diagnosis not present

## 2016-03-11 DIAGNOSIS — H43399 Other vitreous opacities, unspecified eye: Secondary | ICD-10-CM | POA: Diagnosis not present

## 2016-03-21 ENCOUNTER — Other Ambulatory Visit: Payer: Self-pay | Admitting: Internal Medicine

## 2016-03-25 ENCOUNTER — Ambulatory Visit (INDEPENDENT_AMBULATORY_CARE_PROVIDER_SITE_OTHER): Payer: Commercial Managed Care - HMO | Admitting: *Deleted

## 2016-03-25 DIAGNOSIS — Z23 Encounter for immunization: Secondary | ICD-10-CM

## 2016-03-25 NOTE — Progress Notes (Signed)
Patient received flu vaccination. 0.5 ml Im right deltoid.

## 2016-04-19 ENCOUNTER — Encounter: Payer: Self-pay | Admitting: *Deleted

## 2016-04-25 DIAGNOSIS — G5602 Carpal tunnel syndrome, left upper limb: Secondary | ICD-10-CM | POA: Diagnosis not present

## 2016-04-25 DIAGNOSIS — M79642 Pain in left hand: Secondary | ICD-10-CM | POA: Diagnosis not present

## 2016-04-25 DIAGNOSIS — R200 Anesthesia of skin: Secondary | ICD-10-CM | POA: Diagnosis not present

## 2016-05-05 ENCOUNTER — Ambulatory Visit (INDEPENDENT_AMBULATORY_CARE_PROVIDER_SITE_OTHER): Payer: Commercial Managed Care - HMO | Admitting: Internal Medicine

## 2016-05-05 ENCOUNTER — Encounter: Payer: Self-pay | Admitting: Internal Medicine

## 2016-05-05 VITALS — BP 128/70 | HR 98 | Temp 98.0°F | Resp 16 | Ht 64.0 in | Wt 158.0 lb

## 2016-05-05 DIAGNOSIS — I10 Essential (primary) hypertension: Secondary | ICD-10-CM | POA: Diagnosis not present

## 2016-05-05 DIAGNOSIS — Z794 Long term (current) use of insulin: Secondary | ICD-10-CM | POA: Diagnosis not present

## 2016-05-05 DIAGNOSIS — E1142 Type 2 diabetes mellitus with diabetic polyneuropathy: Secondary | ICD-10-CM

## 2016-05-05 DIAGNOSIS — E119 Type 2 diabetes mellitus without complications: Secondary | ICD-10-CM | POA: Diagnosis not present

## 2016-05-05 DIAGNOSIS — E782 Mixed hyperlipidemia: Secondary | ICD-10-CM | POA: Diagnosis not present

## 2016-05-05 DIAGNOSIS — E559 Vitamin D deficiency, unspecified: Secondary | ICD-10-CM | POA: Diagnosis not present

## 2016-05-05 DIAGNOSIS — Z79899 Other long term (current) drug therapy: Secondary | ICD-10-CM | POA: Diagnosis not present

## 2016-05-05 LAB — LIPID PANEL
Cholesterol: 206 mg/dL — ABNORMAL HIGH (ref <200)
HDL: 57 mg/dL (ref >50)
LDL CALC: 87 mg/dL (ref <100)
Total CHOL/HDL Ratio: 3.6 Ratio (ref <5.0)
Triglycerides: 310 mg/dL — ABNORMAL HIGH (ref <150)
VLDL: 62 mg/dL — ABNORMAL HIGH (ref <30)

## 2016-05-05 LAB — HEPATIC FUNCTION PANEL
ALBUMIN: 4.3 g/dL (ref 3.6–5.1)
ALK PHOS: 80 U/L (ref 33–130)
ALT: 18 U/L (ref 6–29)
AST: 18 U/L (ref 10–35)
BILIRUBIN INDIRECT: 0.3 mg/dL (ref 0.2–1.2)
Bilirubin, Direct: 0.1 mg/dL (ref <=0.2)
TOTAL PROTEIN: 7.2 g/dL (ref 6.1–8.1)
Total Bilirubin: 0.4 mg/dL (ref 0.2–1.2)

## 2016-05-05 LAB — BASIC METABOLIC PANEL WITH GFR
BUN: 10 mg/dL (ref 7–25)
CHLORIDE: 100 mmol/L (ref 98–110)
CO2: 25 mmol/L (ref 20–31)
Calcium: 9.4 mg/dL (ref 8.6–10.4)
Creat: 0.79 mg/dL (ref 0.50–0.99)
GFR, EST NON AFRICAN AMERICAN: 82 mL/min (ref >=60)
GFR, Est African American: >89 mL/min (ref >=60)
GLUCOSE: 198 mg/dL — AB (ref 65–99)
POTASSIUM: 4.4 mmol/L (ref 3.5–5.3)
Sodium: 137 mmol/L (ref 135–146)

## 2016-05-05 LAB — CBC WITH DIFFERENTIAL/PLATELET
BASOS PCT: 0 %
Basophils Absolute: 0 cells/uL (ref 0–200)
EOS ABS: 392 {cells}/uL (ref 15–500)
Eosinophils Relative: 4 %
HEMATOCRIT: 38.2 % (ref 35.0–45.0)
HEMOGLOBIN: 12.6 g/dL (ref 11.7–15.5)
LYMPHS ABS: 2842 {cells}/uL (ref 850–3900)
LYMPHS PCT: 29 %
MCH: 27.3 pg (ref 27.0–33.0)
MCHC: 33 g/dL (ref 32.0–36.0)
MCV: 82.9 fL (ref 80.0–100.0)
MONO ABS: 686 {cells}/uL (ref 200–950)
MPV: 9.8 fL (ref 7.5–12.5)
Monocytes Relative: 7 %
Neutro Abs: 5880 cells/uL (ref 1500–7800)
Neutrophils Relative %: 60 %
Platelets: 403 10*3/uL — ABNORMAL HIGH (ref 140–400)
RBC: 4.61 MIL/uL (ref 3.80–5.10)
RDW: 14.7 % (ref 11.0–15.0)
WBC: 9.8 10*3/uL (ref 3.8–10.8)

## 2016-05-05 NOTE — Progress Notes (Signed)
Assessment and Plan:  Hypertension:  -Continue medication,  -monitor blood pressure at home.  -Continue DASH diet.   -Reminder to go to the ER if any CP, SOB, nausea, dizziness, severe HA, changes vision/speech, left arm numbness and tingling, and jaw pain.  Cholesterol: -Continue diet and exercise.  -Check cholesterol.   Diabetes without current complications: -blood sugars reported are sounding in stable region -cont meds -needs new meter and have advised her to contact insurance and to call me and let me know which brand she needs for formulary -Continue diet and exercise.  -Check A1C  Vitamin D Def: -check level -continue medications.   Continue diet and meds as discussed. Further disposition pending results of labs.  HPI 60 y.o. female  presents for 3 month follow up with hypertension, hyperlipidemia, prediabetes and vitamin D.   Her blood pressure has been controlled at home, today their BP is BP: 128/70.   She does not workout. She denies chest pain, shortness of breath, dizziness.   She is on cholesterol medication and denies myalgias. Her cholesterol is at goal. The cholesterol last visit was:   Lab Results  Component Value Date   CHOL 199 02/04/2016   HDL 65 02/04/2016   LDLCALC 84 02/04/2016   TRIG 251 (H) 02/04/2016   CHOLHDL 3.1 02/04/2016     She has been working on diet and exercise for prediabetes, and denies foot ulcerations, hyperglycemia, hypoglycemia , increased appetite, nausea, paresthesia of the feet, polydipsia, polyuria, visual disturbances, vomiting and weight loss. Last A1C in the office was:  Lab Results  Component Value Date   HGBA1C 6.8 (H) 02/04/2016  Blood sugars are generally in the 140s-150s after lunch.  Fasting is about 110.  She reports no low blood sugars.    Patient is on Vitamin D supplement.  Lab Results  Component Value Date   VD25OH 61 02/04/2016      Current Medications:  Current Outpatient Prescriptions on File Prior to  Visit  Medication Sig Dispense Refill  . acetaminophen (TYLENOL) 500 MG tablet Take 1,000 mg by mouth every 6 (six) hours as needed. For pain    . amitriptyline (ELAVIL) 25 MG tablet 1/2-2 at night for nerve pain 60 tablet 0  . B-D ULTRAFINE III SHORT PEN 31G X 8 MM MISC USE AS DIRECTED  WITH  LANTUS  INJECTIONS 90 each 3  . buPROPion (WELLBUTRIN XL) 300 MG 24 hr tablet     . cholecalciferol (VITAMIN D) 1000 UNITS tablet Take 2,000 Units by mouth daily.     . diclofenac sodium (VOLTAREN) 1 % GEL Apply 2 g topically 4 (four) times daily. 100 g 2  . glipiZIDE (GLUCOTROL) 5 MG tablet TAKE 1 TABLET EVERY DAY BEFORE BREAKFAST 90 tablet 1  . KOMBIGLYZE XR 2.09-998 MG TB24 TAKE 1 TABLET TWICE DAILY WITH A MEAL 180 tablet 3  . LANTUS SOLOSTAR 100 UNIT/ML Solostar Pen INJECT  8 UNITS SUBCUTANEOUSLY EVERY DAY AS DIRECTED OR AS DIRECTED BY PRIMARY CARE PHYSICIAN 15 mL 2  . losartan (COZAAR) 100 MG tablet Take 1/2 to 1 tablet daily as directed for BP & Kidney Protection 90 tablet 4  . meloxicam (MOBIC) 7.5 MG tablet Take 1 tablet (7.5 mg total) by mouth 2 (two) times daily as needed for pain. 60 tablet 0  . omeprazole (PRILOSEC) 40 MG capsule Take 1 capsule 2  X /day for acid reflux & indigestion 60 capsule 1  . pravastatin (PRAVACHOL) 40 MG tablet TAKE 1 TABLET EVERY  DAY 90 tablet 1  . ranitidine (ZANTAC) 300 MG capsule Take 1 capsule (300 mg total) by mouth every evening. 90 capsule 1  . venlafaxine XR (EFFEXOR-XR) 75 MG 24 hr capsule     . Vitamins A & D 5000-400 UNITS CAPS     . zolpidem (AMBIEN) 10 MG tablet Take 10 mg by mouth at bedtime.      No current facility-administered medications on file prior to visit.     Medical History:  Past Medical History:  Diagnosis Date  . Anxiety   . Chest pain 04/2011   saw Dr Ron Parker..not heart related  sent to GI dr. and found gallstones.  . Complication of anesthesia 2001; 20012   "slow to wake up"  . Depression   . GERD (gastroesophageal reflux disease)    . H/O hiatal hernia   . Hypercholesteremia   . Hypertension   . Mental disorder   . Type II diabetes mellitus ([place]) 2003    Allergies:  Allergies  Allergen Reactions  . Codeine Palpitations and Other (See Comments)    Makes her head feel "weird"  . Glucophage [Metformin Hcl] Diarrhea  . Nsaids Other (See Comments)    "upset stomach"     Review of Systems:  Review of Systems  Constitutional: Negative for chills, fever and malaise/fatigue.  HENT: Negative for congestion, ear pain and sore throat.   Eyes: Negative.   Respiratory: Negative for cough, shortness of breath and wheezing.   Cardiovascular: Negative for chest pain, palpitations and leg swelling.  Gastrointestinal: Negative for abdominal pain, blood in stool, constipation, diarrhea, heartburn and melena.  Genitourinary: Negative.   Skin: Negative.   Neurological: Negative for dizziness, sensory change, loss of consciousness and headaches.  Psychiatric/Behavioral: Negative for depression. The patient is not nervous/anxious and does not have insomnia.     Family history- Review and unchanged  Social history- Review and unchanged  Physical Exam: BP 128/70   Pulse 98   Temp 98 F (36.7 C) (Temporal)   Resp 16   Ht 5\' 4"  (1.626 m)   Wt 158 lb (71.7 kg)   BMI 27.12 kg/m  Wt Readings from Last 3 Encounters:  05/05/16 158 lb (71.7 kg)  02/04/16 157 lb (71.2 kg)  12/17/15 155 lb 9.6 oz (70.6 kg)    General Appearance: Well nourished well developed, in no apparent distress. Eyes: PERRLA, EOMs, conjunctiva no swelling or erythema ENT/Mouth: Ear canals normal without obstruction, swelling, erythma, discharge.  TMs normal bilaterally.  Oropharynx moist, clear, without exudate, or postoropharyngeal swelling. Neck: Supple, thyroid normal,no cervical adenopathy  Respiratory: Respiratory effort normal, Breath sounds clear A&P without rhonchi, wheeze, or rale.  No retractions, no accessory usage. Cardio: RRR with no  MRGs. Brisk peripheral pulses without edema.  Abdomen: Soft, + BS,  Non tender, no guarding, rebound, hernias, masses. Musculoskeletal: Full ROM, 5/5 strength, Normal gait Skin: Warm, dry without rashes, lesions, ecchymosis.  Neuro: Awake and oriented X 3, Cranial nerves intact. Normal muscle tone, no cerebellar symptoms. Psych: Normal affect, Insight and Judgment appropriate.    Starlyn Skeans, PA-C 3:06 PM [facility]

## 2016-05-06 LAB — HEMOGLOBIN A1C
HEMOGLOBIN A1C: 7.1 % — AB (ref <5.7)
MEAN PLASMA GLUCOSE: 157 mg/dL

## 2016-05-06 LAB — TSH: TSH: 2.17 m[IU]/L

## 2016-05-10 DIAGNOSIS — R200 Anesthesia of skin: Secondary | ICD-10-CM | POA: Diagnosis not present

## 2016-05-10 DIAGNOSIS — M79642 Pain in left hand: Secondary | ICD-10-CM | POA: Diagnosis not present

## 2016-05-16 DIAGNOSIS — R05 Cough: Secondary | ICD-10-CM | POA: Diagnosis not present

## 2016-05-16 DIAGNOSIS — J0140 Acute pansinusitis, unspecified: Secondary | ICD-10-CM | POA: Diagnosis not present

## 2016-05-16 DIAGNOSIS — B379 Candidiasis, unspecified: Secondary | ICD-10-CM | POA: Diagnosis not present

## 2016-05-16 DIAGNOSIS — T3695XA Adverse effect of unspecified systemic antibiotic, initial encounter: Secondary | ICD-10-CM | POA: Diagnosis not present

## 2016-05-26 ENCOUNTER — Other Ambulatory Visit: Payer: Self-pay | Admitting: Physician Assistant

## 2016-05-26 DIAGNOSIS — K219 Gastro-esophageal reflux disease without esophagitis: Secondary | ICD-10-CM

## 2016-06-04 ENCOUNTER — Ambulatory Visit: Payer: Self-pay | Admitting: Internal Medicine

## 2016-06-08 DIAGNOSIS — J209 Acute bronchitis, unspecified: Secondary | ICD-10-CM | POA: Diagnosis not present

## 2016-06-08 DIAGNOSIS — R05 Cough: Secondary | ICD-10-CM | POA: Diagnosis not present

## 2016-06-23 ENCOUNTER — Encounter: Payer: Self-pay | Admitting: Internal Medicine

## 2016-06-23 ENCOUNTER — Ambulatory Visit (INDEPENDENT_AMBULATORY_CARE_PROVIDER_SITE_OTHER): Payer: Medicare HMO | Admitting: Internal Medicine

## 2016-06-23 VITALS — BP 128/66 | HR 98 | Temp 98.0°F | Resp 16 | Ht 64.0 in | Wt 152.0 lb

## 2016-06-23 DIAGNOSIS — J069 Acute upper respiratory infection, unspecified: Secondary | ICD-10-CM | POA: Diagnosis not present

## 2016-06-23 MED ORDER — PSEUDOEPH-BROMPHEN-DM 30-2-10 MG/5ML PO SYRP: 5.0000 mL | ORAL_SOLUTION | Freq: Four times a day (QID) | ORAL | 0 refills | Status: DC | PRN

## 2016-06-23 MED ORDER — AZELASTINE HCL 0.1 % NA SOLN: 2.0000 | Freq: Two times a day (BID) | NASAL | 2 refills | Status: DC

## 2016-06-23 MED ORDER — PREDNISONE 20 MG PO TABS
ORAL_TABLET | ORAL | 0 refills | Status: DC
Start: 2016-06-23 — End: 2016-08-07

## 2016-06-23 MED ORDER — FLUTICASONE PROPIONATE 50 MCG/ACT NA SUSP: 2.0000 | Freq: Every day | NASAL | 0 refills | Status: DC

## 2016-06-23 MED ORDER — AZITHROMYCIN 250 MG PO TABS: ORAL_TABLET | ORAL | 0 refills | Status: DC

## 2016-06-23 NOTE — Progress Notes (Signed)
HPI  Patient presents to the office for evaluation of cough.  It has been going on for 2 days.  Patient reports dry, barky, worse with lying down.  They also endorse change in voice, postnasal drip and nasal congestion, clear rhinorrhea, headache, scratchy throat.  .  They have tried severe cold [facility] which helps a little.  They report that nothing has worked.  They admits to other sick contacts.  Review of Systems  Constitutional: Positive for malaise/fatigue. Negative for chills and fever.  HENT: Positive for congestion, ear pain, hearing loss and sore throat.   Respiratory: Positive for cough. Negative for sputum production, shortness of breath and wheezing.   Cardiovascular: Negative for chest pain, palpitations and leg swelling.  Neurological: Positive for headaches.    PE:  Vitals:   06/23/16 1459  BP: 128/66  Pulse: 98  Resp: 16  Temp: 98 F (36.7 C)    General:  Alert and non-toxic, WDWN, NAD HEENT: NCAT, PERLA, EOM normal, no occular discharge or erythema.  Nasal mucosal edema with sinus tenderness to palpation.  Oropharynx clear with minimal oropharyngeal edema and erythema.  Mucous membranes moist and pink. Neck:  Cervical adenopathy Chest:  RRR no MRGs.  Lungs clear to auscultation A&P with no wheezes rhonchi or rales.   Abdomen: +BS x 4 quadrants, soft, non-tender, no guarding, rigidity, or rebound. Skin: warm and dry no rash Neuro: A&Ox4, CN II-XII grossly intact  Assessment and Plan:   1. Acute URI -cont nasal saline -tylenol or ibuprofen prn for body aches.  -return to office if no improvement.  Likely estimated course is 2 weeks - azithromycin (ZITHROMAX Z-PAK) 250 MG tablet; 2 po day one, then 1 daily x 4 days  Dispense: 6 tablet; Refill: 0 - predniSONE (DELTASONE) 20 MG tablet; 3 tabs po daily x 3 days, then 2 tabs x 3 days, then 1.5 tabs x 3 days, then 1 tab x 3 days, then 0.5 tabs x 3 days  Dispense: 27 tablet; Refill: 0 - fluticasone (FLONASE) 50 MCG/ACT  nasal spray; Place 2 sprays into both nostrils daily.  Dispense: 16 g; Refill: 0 - azelastine (ASTELIN) 0.1 % nasal spray; Place 2 sprays into both nostrils 2 (two) times daily. Use in each nostril as directed  Dispense: 30 mL; Refill: 2 - brompheniramine-pseudoephedrine-DM 30-2-10 MG/5ML syrup; Take 5 mLs by mouth 4 (four) times daily as needed.  Dispense: 120 mL; Refill: 0

## 2016-06-23 NOTE — Patient Instructions (Signed)
Please take tylenol as needed for body aches.  Please take the prednisone as prescribed with your breakfast.  Please check blood sugars.  Call the office if they are consistently over 300.  Please take the zpak until it is gone.  Please take the astelin 2 sprays per nostril in the monring and 2 sprays per nostril in the evening.  Please use flonase 2 sprays per nostril before bedtime.  Please take cough syrup up to 3-4 times daily as needed for cough.  Please take the ranitidine twice daily for help drying up congestion.  Drink plenty of fluids.

## 2016-06-26 ENCOUNTER — Encounter: Payer: Self-pay | Admitting: Internal Medicine

## 2016-06-27 ENCOUNTER — Other Ambulatory Visit: Payer: Self-pay | Admitting: Internal Medicine

## 2016-06-27 MED ORDER — FLUCONAZOLE 150 MG PO TABS: 150.0000 mg | ORAL_TABLET | Freq: Once | ORAL | 0 refills | Status: DC

## 2016-06-27 MED ORDER — FLUCONAZOLE 150 MG PO TABS: 150.0000 mg | ORAL_TABLET | Freq: Once | ORAL | 0 refills | Status: AC

## 2016-06-29 ENCOUNTER — Other Ambulatory Visit: Payer: Self-pay | Admitting: Internal Medicine

## 2016-07-01 ENCOUNTER — Emergency Department (HOSPITAL_COMMUNITY): Payer: Medicare HMO

## 2016-07-01 ENCOUNTER — Emergency Department (HOSPITAL_COMMUNITY)
Admission: EM | Admit: 2016-07-01 | Discharge: 2016-07-01 | Disposition: A | Discharge status: Home / Self Care | Payer: Medicare HMO | Attending: Emergency Medicine | Admitting: Emergency Medicine

## 2016-07-01 ENCOUNTER — Encounter (HOSPITAL_COMMUNITY): Payer: Self-pay

## 2016-07-01 DIAGNOSIS — E1140 Type 2 diabetes mellitus with diabetic neuropathy, unspecified: Secondary | ICD-10-CM | POA: Insufficient documentation

## 2016-07-01 DIAGNOSIS — N23 Unspecified renal colic: Secondary | ICD-10-CM

## 2016-07-01 DIAGNOSIS — N132 Hydronephrosis with renal and ureteral calculous obstruction: Secondary | ICD-10-CM | POA: Diagnosis not present

## 2016-07-01 DIAGNOSIS — I10 Essential (primary) hypertension: Secondary | ICD-10-CM | POA: Diagnosis not present

## 2016-07-01 DIAGNOSIS — Z794 Long term (current) use of insulin: Secondary | ICD-10-CM | POA: Diagnosis not present

## 2016-07-01 DIAGNOSIS — R1012 Left upper quadrant pain: Secondary | ICD-10-CM | POA: Diagnosis not present

## 2016-07-01 HISTORY — DX: Disorder of kidney and ureter, unspecified: N28.9

## 2016-07-01 LAB — URINALYSIS, ROUTINE W REFLEX MICROSCOPIC
Bilirubin Urine: NEGATIVE
Glucose, UA: NEGATIVE mg/dL
Ketones, ur: 5 mg/dL — AB
LEUKOCYTES UA: NEGATIVE
NITRITE: NEGATIVE
PH: 5 (ref 5.0–8.0)
Protein, ur: NEGATIVE mg/dL
SPECIFIC GRAVITY, URINE: 1.02 (ref 1.005–1.030)

## 2016-07-01 LAB — COMPREHENSIVE METABOLIC PANEL
ALT: 55 U/L — ABNORMAL HIGH (ref 14–54)
ANION GAP: 13 (ref 5–15)
AST: 52 U/L — ABNORMAL HIGH (ref 15–41)
Albumin: 3.8 g/dL (ref 3.5–5.0)
Alkaline Phosphatase: 63 U/L (ref 38–126)
BUN: 14 mg/dL (ref 6–20)
CO2: 23 mmol/L (ref 22–32)
CREATININE: 0.89 mg/dL (ref 0.44–1.00)
Calcium: 9 mg/dL (ref 8.9–10.3)
Chloride: 102 mmol/L (ref 101–111)
GFR calc non Af Amer: >60 mL/min (ref >60)
Glucose, Bld: 215 mg/dL — ABNORMAL HIGH (ref 65–99)
POTASSIUM: 3.4 mmol/L — AB (ref 3.5–5.1)
SODIUM: 138 mmol/L (ref 135–145)
Total Bilirubin: 0.9 mg/dL (ref 0.3–1.2)
Total Protein: 7.3 g/dL (ref 6.5–8.1)

## 2016-07-01 LAB — CBC WITH DIFFERENTIAL/PLATELET
Basophils Absolute: 0 10*3/uL (ref 0.0–0.1)
Basophils Relative: 0 %
EOS ABS: 0.1 10*3/uL (ref 0.0–0.7)
EOS PCT: 1 %
HCT: 36.5 % (ref 36.0–46.0)
Hemoglobin: 11.9 g/dL — ABNORMAL LOW (ref 12.0–15.0)
LYMPHS ABS: 4.1 10*3/uL — AB (ref 0.7–4.0)
LYMPHS PCT: 29 %
MCH: 27 pg (ref 26.0–34.0)
MCHC: 32.6 g/dL (ref 30.0–36.0)
MCV: 82.8 fL (ref 78.0–100.0)
MONO ABS: 0.8 10*3/uL (ref 0.1–1.0)
Monocytes Relative: 5 %
Neutro Abs: 9.1 10*3/uL — ABNORMAL HIGH (ref 1.7–7.7)
Neutrophils Relative %: 65 %
PLATELETS: 193 10*3/uL (ref 150–400)
RBC: 4.41 MIL/uL (ref 3.87–5.11)
RDW: 14 % (ref 11.5–15.5)
WBC: 14.1 10*3/uL — ABNORMAL HIGH (ref 4.0–10.5)

## 2016-07-01 LAB — LIPASE, BLOOD: LIPASE: 24 U/L (ref 11–51)

## 2016-07-01 MED ORDER — OXYCODONE-ACETAMINOPHEN 5-325 MG PO TABS: 1.0000 | ORAL_TABLET | Freq: Four times a day (QID) | ORAL | 0 refills | Status: DC | PRN

## 2016-07-01 MED ORDER — KETOROLAC TROMETHAMINE 30 MG/ML IJ SOLN
30.0000 mg | Freq: Once | INTRAMUSCULAR | Status: AC
  Administered 2016-07-01: 30 mg via INTRAVENOUS
  Filled 2016-07-01: qty 1

## 2016-07-01 MED ORDER — MORPHINE SULFATE (PF) 4 MG/ML IV SOLN
4.0000 mg | Freq: Once | INTRAVENOUS | Status: AC
  Administered 2016-07-01: 4 mg via INTRAVENOUS
  Filled 2016-07-01: qty 1

## 2016-07-01 MED ORDER — ONDANSETRON 4 MG PO TBDP: ORAL_TABLET | ORAL | 0 refills | Status: DC

## 2016-07-01 NOTE — ED Triage Notes (Signed)
Per [facility] patient with LUQ pain that began today @330 .  Patient states that pain is stabbing and radiates to her back.  Per EMS patient has had BM today without problems.  Patient with HX of DM, Patient had 73mcg Fentanyl en route.

## 2016-07-01 NOTE — ED Notes (Signed)
Bed: [id number] Expected date:  Expected time:  Means of arrival:  Comments: 61 yo EMS abdominal pain.

## 2016-07-01 NOTE — ED Provider Notes (Signed)
[facility] DEPT Provider Note   CSN: [id number] Arrival date & time: 07/01/16  1643     History   Chief Complaint Chief Complaint  Patient presents with  . Abdominal Pain    HPI Mindy Richard is a 61 y.o. female.  Patient reports sudden onset of LUQ pain around 0330 this morning.  Pain has been constant, sharp in nature with radiation to the back. No change in pain after bowel movement. Patient also endorses dysuria.   The history is provided by the patient. No language interpreter was used.  Abdominal Pain   This is a new problem. The current episode started 12 to 24 hours ago. The problem occurs constantly. The problem has not changed since onset.The pain is located in the LUQ. The pain is moderate. Associated symptoms include fever and dysuria. Her past medical history is significant for GERD.    Past Medical History:  Diagnosis Date  . Anxiety   . Chest pain 04/2011   saw Dr Ron Parker..not heart related  sent to GI dr. and found gallstones.  . Complication of anesthesia 2001; 20012   "slow to wake up"  . Depression   . GERD (gastroesophageal reflux disease)   . H/O hiatal hernia   . Hypercholesteremia   . Hypertension   . Mental disorder   . Type II diabetes mellitus ([place]) 2003    Patient Active Problem List   Diagnosis Date Noted  . OSA (obstructive sleep apnea) 06/04/2015  . Neuropathy, diabetic ([place]) 04/16/2015  . Medicare annual wellness visit, initial 01/11/2015  . Insulin-requiring or dependent type II diabetes mellitus ([place]) 08/10/2013  . Vitamin D deficiency 04/03/2013  . Medication management 04/03/2013  . Hyperlipidemia 04/03/2013  . Hypertension 05/09/2011  . Depression, major, in remission ([place]) 05/09/2011  . GERD 05/09/2011    Past Surgical History:  Procedure Laterality Date  . CARPAL TUNNEL RELEASE  2001   right  . CESAREAN SECTION  1985  . CHOLECYSTECTOMY  09/02/11  . CHOLECYSTECTOMY  09/02/2011   Procedure: LAPAROSCOPIC CHOLECYSTECTOMY  WITH INTRAOPERATIVE CHOLANGIOGRAM;  Surgeon: Haywood Lasso, MD;  Location: [facility];  Service: General;  Laterality: N/A;  . COLONOSCOPY    . DILATION AND CURETTAGE OF UTERUS  1984  . ERCP  09/03/2011   Procedure: ENDOSCOPIC RETROGRADE CHOLANGIOPANCREATOGRAPHY (ERCP);  Surgeon: Missy Sabins, MD;  Location[facility] ENDOSCOPY;  Service: Endoscopy;  Laterality: N/A;  sphincterotomy and stone extraction  . SHOULDER ARTHROSCOPY W/ ROTATOR CUFF REPAIR  2012   left  . TONSILLECTOMY  ~ 1970  . TUBAL LIGATION  1985?    OB History    No data available       Home Medications    Prior to Admission medications   Medication Sig Start Date End Date Taking? Authorizing Provider  acetaminophen (TYLENOL) 500 MG tablet Take 1,000 mg by mouth every 6 (six) hours as needed. For pain    Historical Provider, MD  amitriptyline (ELAVIL) 25 MG tablet 1/2-2 at night for nerve pain 12/17/15   Vicie Mutters, PA-C  azelastine (ASTELIN) 0.1 % nasal spray Place 2 sprays into both nostrils 2 (two) times daily. Use in each nostril as directed 06/23/16 06/23/17  Loma Sousa Forcucci, PA-C  azithromycin (ZITHROMAX Z-PAK) 250 MG tablet 2 po day one, then 1 daily x 4 days 06/23/16   Starlyn Skeans, PA-C  B-D ULTRAFINE III SHORT PEN 31G X 8 MM MISC USE AS DIRECTED  WITH  LANTUS  INJECTIONS 06/29/16   Unk Pinto, MD  brompheniramine-pseudoephedrine-DM 30-2-10 MG/5ML syrup Take 5 mLs by mouth 4 (four) times daily as needed. 06/23/16   Courtney Forcucci, PA-C  buPROPion (WELLBUTRIN XL) 300 MG 24 hr tablet  05/21/14   Historical Provider, MD  cholecalciferol (VITAMIN D) 1000 UNITS tablet Take 2,000 Units by mouth daily.     Historical Provider, MD  diclofenac sodium (VOLTAREN) 1 % GEL Apply 2 g topically 4 (four) times daily. 02/04/16   Unk Pinto, MD  fluticasone (FLONASE) 50 MCG/ACT nasal spray Place 2 sprays into both nostrils daily. 06/23/16   Courtney Forcucci, PA-C  KOMBIGLYZE XR 2.09-998 MG TB24 TAKE 1 TABLET TWICE DAILY WITH A  MEAL 10/30/15   Unk Pinto, MD  LANTUS SOLOSTAR 100 UNIT/ML Solostar Pen INJECT  8 UNITS SUBCUTANEOUSLY EVERY DAY AS DIRECTED OR AS DIRECTED BY PRIMARY CARE PHYSICIAN 03/21/16   Unk Pinto, MD  losartan (COZAAR) 100 MG tablet Take 1/2 to 1 tablet daily as directed for BP & Kidney Protection 11/29/15   Unk Pinto, MD  meloxicam (MOBIC) 7.5 MG tablet Take 1 tablet (7.5 mg total) by mouth 2 (two) times daily as needed for pain. 11/13/15   Shawnee Knapp, MD  omeprazole (PRILOSEC) 40 MG capsule TAKE 1 CAPSULE EVERY DAY 05/26/16   Unk Pinto, MD  pravastatin (PRAVACHOL) 40 MG tablet TAKE 1 TABLET EVERY DAY 05/26/16   Unk Pinto, MD  predniSONE (DELTASONE) 20 MG tablet 3 tabs po daily x 3 days, then 2 tabs x 3 days, then 1.5 tabs x 3 days, then 1 tab x 3 days, then 0.5 tabs x 3 days 06/23/16   Starlyn Skeans, PA-C  ranitidine (ZANTAC) 300 MG capsule Take 1 capsule (300 mg total) by mouth every evening. 02/04/16   Unk Pinto, MD  venlafaxine XR (EFFEXOR-XR) 75 MG 24 hr capsule  05/21/14   Historical Provider, MD  Vitamins A & D 5000-400 UNITS CAPS  01/30/15   Historical Provider, MD  zolpidem (AMBIEN) 10 MG tablet Take 10 mg by mouth at bedtime.  05/06/11   Historical Provider, MD    Family History Family History  Problem Relation Age of Onset  . Cancer Maternal Aunt     breast  . Heart attack Father 76    Smoker  . Diabetes Mother   . Hyperlipidemia Mother   . Anesthesia problems Neg Hx   . Hypotension Neg Hx     Social History Social History  Substance Use Topics  . Smoking status: Never Smoker  . Smokeless tobacco: Never Used  . Alcohol use No     Allergies   Codeine; Glucophage [metformin hcl]; and Nsaids   Review of Systems Review of Systems  Constitutional: Positive for fever.  HENT: Positive for congestion.   Respiratory: Positive for cough.   Gastrointestinal: Positive for abdominal pain.  Genitourinary: Positive for dysuria.  All other systems reviewed  and are negative.    Physical Exam Updated Vital Signs BP 144/87   Pulse 65   Temp 97.7 F (36.5 C) (Oral)   Resp 19   SpO2 97%   Physical Exam  Constitutional: She is oriented to person, place, and time. She appears well-developed and well-nourished.  HENT:  Head: Normocephalic.  Eyes: Conjunctivae are normal.  Neck: Neck supple.  Cardiovascular: Normal rate and regular rhythm.   Pulmonary/Chest: Effort normal and breath sounds normal.  Abdominal: Soft. She exhibits no distension. There is tenderness.  Musculoskeletal: Normal range of motion. She exhibits no edema.  Neurological: She is alert and oriented  to person, place, and time.  Skin: Skin is warm. No rash noted.  Psychiatric: She has a normal mood and affect.  Nursing note and vitals reviewed.    ED Treatments / Results  Labs (all labs ordered are listed, but only abnormal results are displayed) Labs Reviewed  CBC WITH DIFFERENTIAL/PLATELET  COMPREHENSIVE METABOLIC PANEL  LIPASE, BLOOD  URINALYSIS, ROUTINE W REFLEX MICROSCOPIC    EKG  EKG Interpretation None       Radiology No results found.  Procedures Procedures (including critical care time)  Medications Ordered in ED Medications  morphine 4 MG/ML injection 4 mg (4 mg Intravenous Given 07/01/16 1825)  ketorolac (TORADOL) 30 MG/ML injection 30 mg (30 mg Intravenous Given 07/01/16 2118)     Initial Impression / Assessment and Plan / ED Course  I have reviewed the triage vital signs and the nursing notes.  Pertinent labs & imaging results that were available during my care of the patient were reviewed by me and considered in my medical decision making (see chart for details).    Pt has been diagnosed with a Kidney Stone via CT. There is no evidence of significant hydronephrosis, serum creatine WNL, vitals sign stable and the pt does not have irratractable vomiting. Pain improved with treatment in the ED. Pt will be dc home with pain medications &  has been advised to follow up with PCP/urology.     Final Clinical Impressions(s) / ED Diagnoses   Final diagnoses:  Renal colic on left side    New Prescriptions Discharge Medication List as of 07/01/2016 10:09 PM    START taking these medications   Details  ondansetron (ZOFRAN ODT) 4 MG disintegrating tablet 4mg  ODT q6 hours prn nausea/vomit, Print    oxyCODONE-acetaminophen (PERCOCET) 5-325 MG tablet Take 1 tablet by mouth every 6 (six) hours as needed for severe pain., Starting Tue 07/01/2016, Print         Etta Quill, NP 07/02/16 0131    Lacretia Leigh, MD 07/03/16 1459

## 2016-07-08 DIAGNOSIS — G5602 Carpal tunnel syndrome, left upper limb: Secondary | ICD-10-CM | POA: Diagnosis not present

## 2016-07-14 DIAGNOSIS — G5602 Carpal tunnel syndrome, left upper limb: Secondary | ICD-10-CM | POA: Diagnosis not present

## 2016-07-14 DIAGNOSIS — Z4789 Encounter for other orthopedic aftercare: Secondary | ICD-10-CM | POA: Diagnosis not present

## 2016-07-22 DIAGNOSIS — G5601 Carpal tunnel syndrome, right upper limb: Secondary | ICD-10-CM | POA: Diagnosis not present

## 2016-08-04 DIAGNOSIS — G5601 Carpal tunnel syndrome, right upper limb: Secondary | ICD-10-CM | POA: Diagnosis not present

## 2016-08-07 ENCOUNTER — Ambulatory Visit: Payer: Self-pay | Admitting: Internal Medicine

## 2016-08-07 ENCOUNTER — Ambulatory Visit (INDEPENDENT_AMBULATORY_CARE_PROVIDER_SITE_OTHER): Payer: Medicare HMO | Admitting: Physician Assistant

## 2016-08-07 ENCOUNTER — Encounter: Payer: Self-pay | Admitting: Physician Assistant

## 2016-08-07 VITALS — BP 130/82 | HR 88 | Temp 97.7°F | Resp 16 | Wt 152.2 lb

## 2016-08-07 DIAGNOSIS — Z794 Long term (current) use of insulin: Secondary | ICD-10-CM | POA: Diagnosis not present

## 2016-08-07 DIAGNOSIS — E119 Type 2 diabetes mellitus without complications: Secondary | ICD-10-CM | POA: Diagnosis not present

## 2016-08-07 DIAGNOSIS — E559 Vitamin D deficiency, unspecified: Secondary | ICD-10-CM

## 2016-08-07 DIAGNOSIS — K2100 Gastro-esophageal reflux disease with esophagitis, without bleeding: Secondary | ICD-10-CM

## 2016-08-07 DIAGNOSIS — G4733 Obstructive sleep apnea (adult) (pediatric): Secondary | ICD-10-CM | POA: Diagnosis not present

## 2016-08-07 DIAGNOSIS — E1142 Type 2 diabetes mellitus with diabetic polyneuropathy: Secondary | ICD-10-CM | POA: Diagnosis not present

## 2016-08-07 DIAGNOSIS — F325 Major depressive disorder, single episode, in full remission: Secondary | ICD-10-CM

## 2016-08-07 DIAGNOSIS — E663 Overweight: Secondary | ICD-10-CM

## 2016-08-07 DIAGNOSIS — R6889 Other general symptoms and signs: Secondary | ICD-10-CM | POA: Diagnosis not present

## 2016-08-07 DIAGNOSIS — I10 Essential (primary) hypertension: Secondary | ICD-10-CM

## 2016-08-07 DIAGNOSIS — Z0001 Encounter for general adult medical examination with abnormal findings: Secondary | ICD-10-CM

## 2016-08-07 DIAGNOSIS — Z79899 Other long term (current) drug therapy: Secondary | ICD-10-CM | POA: Diagnosis not present

## 2016-08-07 DIAGNOSIS — E782 Mixed hyperlipidemia: Secondary | ICD-10-CM

## 2016-08-07 DIAGNOSIS — R21 Rash and other nonspecific skin eruption: Secondary | ICD-10-CM

## 2016-08-07 DIAGNOSIS — Z0000 Encounter for general adult medical examination without abnormal findings: Secondary | ICD-10-CM

## 2016-08-07 MED ORDER — TRIAMCINOLONE ACETONIDE 0.1 % EX OINT: 1.0000 "application " | TOPICAL_OINTMENT | Freq: Two times a day (BID) | CUTANEOUS | 1 refills | Status: DC

## 2016-08-07 MED ORDER — HYDROCORTISONE 1 % RE CREA: TOPICAL_CREAM | RECTAL | 3 refills | Status: DC

## 2016-08-07 NOTE — Progress Notes (Signed)
MEDICARE ANNUAL WELLNESS VISIT AND FOLLOW UP  Assessment:   Essential hypertension - continue medications, DASH diet, exercise and monitor at home. Call if greater than 130/80.  - CBC with Differential/Platelet - BASIC METABOLIC PANEL WITH GFR - Hepatic function panel - TSH  Insulin-requiring or dependent type II diabetes mellitus ([place]) Discussed general issues about diabetes pathophysiology and management., Educational material distributed., Suggested low cholesterol diet., Encouraged aerobic exercise., Discussed foot care., Reminded to get yearly retinal exam. - Hemoglobin A1c   Diabetic polyneuropathy associated with type 2 diabetes mellitus ([place]) Discussed general issues about diabetes pathophysiology and management., Educational material distributed., Suggested low cholesterol diet., Encouraged aerobic exercise., Discussed foot care., Reminded to get yearly retinal exam.  Hyperlipidemia - on pravastatin - Lipid panel   Depression, major, in remission (HCC) Follows Dr. Toy Care, remission  Medicare annual wellness visit, initial Next year  GERD Diet discussed   Vitamin D deficiency - VITAMIN D 25 Hydroxy (Vit-D Deficiency, Fractures)  Medication management - Magnesium  OSA (obstructive sleep apnea) Sleep apnea- continue CPAP, weight loss advised.     Overweight  - long discussion about weight loss, diet, and exercise  Rash -     triamcinolone ointment (KENALOG) 0.1 %; Apply 1 application topically 2 (two) times daily. -     hydrocortisone (PROCTO-PAK) 1 % CREA; Apply once or twice daily as needed for hemorrhoids  Encounter for long-term (current) use of insulin ([place]) Continue insulin, check A1C, diet discussed  Future Appointments Date Time Provider [place]  11/12/2016 3:30 PM Vicie Mutters, PA-C [facility] None  03/10/2017 2:00 PM Unk Pinto, MD [facility] None     Plan:   During the course of the visit the patient was educated and counseled  about appropriate screening and preventive services including:    Pneumococcal vaccine   Influenza vaccine  Td vaccine  Screening electrocardiogram  Screening mammography  Bone densitometry screening  Colorectal cancer screening  Diabetes screening  Glaucoma screening  Nutrition counseling   Advanced directives: given info/requested  Subjective:   Mindy Richard is a 61 y.o. female who presents for Medicare Annual Wellness Visit and follow up.   Had left hand pain, from dressing has contact dermatitis.  Her blood pressure has been controlled at home, today it is: BP: 130/82  She does not workout. She denies chest pain, shortness of breath, dizziness.  She is on cholesterol medication and denies myalgias. Her cholesterol is at goal. The cholesterol last visit was:   Lab Results  Component Value Date   CHOL 206 (H) 05/05/2016   HDL 57 05/05/2016   LDLCALC 87 05/05/2016   TRIG 310 (H) 05/05/2016   CHOLHDL 3.6 05/05/2016   She has been working on diet and exercise for Diabetes, she checks her sugars and it has been running 120's, she is on 10 units of the lantus and denies polydipsia, polyuria and visual disturbances. She has bilateral toe numbness, no ulcers. She is not on ACE/ARB.  Last A1C in the office was:  Lab Results  Component Value Date   HGBA1C 7.1 (H) 05/05/2016    Lab Results  Component Value Date   GFRNONAA >60 07/01/2016   Patient is on Vitamin D supplement. Lab Results  Component Value Date   VD25OH 61 02/04/2016   Has OSA, not on CPAP, could not tolerate the mask. .  She is on wellbutrin and effexor for anxiety/depression.  BMI is Body mass index is 26.13 kg/m., she is working on diet and exercise.  Wt Readings from Last 3 Encounters:  08/07/16 152 lb 3.2 oz (69 kg)  06/23/16 152 lb (68.9 kg)  05/05/16 158 lb (71.7 kg)     Names of Other Physician/Practitioners you currently use: 1. [facility]- here  for primary care 2. Eye doctor Dr. Marvel Plan- 12/2015 3. Dentist Dr. Vanessa Kick Patient Care Team: Unk Pinto, MD as PCP - General (Internal Medicine) Teena Irani, MD as Consulting Physician (Gastroenterology) Neldon Mc, MD as Consulting Physician (General Surgery) [place] as Respiratory Therapist [facility]) Alden Hipp, MD as Consulting Physician (Obstetrics and Gynecology)  Medication Review Current Outpatient Prescriptions on File Prior to Visit  Medication Sig Dispense Refill  . acetaminophen (TYLENOL) 500 MG tablet Take 500-1,000 mg by mouth every 4 (four) hours as needed for mild pain, moderate pain, fever or headache. For pain     . amitriptyline (ELAVIL) 25 MG tablet 1/2-2 at night for nerve pain 60 tablet 0  . azelastine (ASTELIN) 0.1 % nasal spray Place 2 sprays into both nostrils 2 (two) times daily. Use in each nostril as directed 30 mL 2  . B-D ULTRAFINE III SHORT PEN 31G X 8 MM MISC USE AS DIRECTED  WITH  LANTUS  INJECTIONS 90 each 3  . buPROPion (WELLBUTRIN XL) 300 MG 24 hr tablet Take 300 mg by mouth daily.     . Cholecalciferol (VITAMIN D) 2000 units tablet Take 2,000 Units by mouth daily with breakfast.    . diclofenac sodium (VOLTAREN) 1 % GEL Apply 2 g topically 4 (four) times daily. (Patient taking differently: Apply 2 g topically 4 (four) times daily as needed (for pain). ) 100 g 2  . KOMBIGLYZE XR 2.09-998 MG TB24 TAKE 1 TABLET TWICE DAILY WITH A MEAL 180 tablet 3  . LANTUS SOLOSTAR 100 UNIT/ML Solostar Pen INJECT  8 UNITS SUBCUTANEOUSLY EVERY DAY AS DIRECTED OR AS DIRECTED BY PRIMARY CARE PHYSICIAN (Patient taking differently: INJECT  12 UNITS SUBCUTANEOUSLY EVERY night at bedtime) 15 mL 2  . losartan (COZAAR) 100 MG tablet Take 1/2 to 1 tablet daily as directed for BP & Kidney Protection (Patient taking differently: Take 50 mg by mouth daily with breakfast. for BP & Kidney Protection) 90 tablet 4  . meloxicam (MOBIC) 7.5 MG  tablet Take 1 tablet (7.5 mg total) by mouth 2 (two) times daily as needed for pain. 60 tablet 0  . omeprazole (PRILOSEC) 40 MG capsule TAKE 1 CAPSULE EVERY DAY (Patient taking differently: TAKE 1 CAPSULE EVERY morning) 90 capsule 1  . oxyCODONE-acetaminophen (PERCOCET) 5-325 MG tablet Take 1 tablet by mouth every 6 (six) hours as needed for severe pain. 10 tablet 0  . pravastatin (PRAVACHOL) 40 MG tablet TAKE 1 TABLET EVERY DAY (Patient taking differently: TAKE 1 TABLET EVERY night) 90 tablet 1  . Pyridoxine HCl (VITAMIN B-6 PO) Take 1 tablet by mouth daily with breakfast.    . ranitidine (ZANTAC) 300 MG capsule Take 1 capsule (300 mg total) by mouth every evening. (Patient taking differently: Take 300 mg by mouth daily. ) 90 capsule 1  . venlafaxine XR (EFFEXOR-XR) 75 MG 24 hr capsule Take 75 mg by mouth daily with breakfast.     . zolpidem (AMBIEN) 10 MG tablet Take 10 mg by mouth at bedtime.      No current facility-administered medications on file prior to visit.     Current Problems (verified) Patient Active Problem List   Diagnosis Date Noted  . OSA (obstructive  sleep apnea) 06/04/2015  . Neuropathy, diabetic ([place]) 04/16/2015  . Medicare annual wellness visit, initial 01/11/2015  . Insulin-requiring or dependent type II diabetes mellitus ([place]) 08/10/2013  . Vitamin D deficiency 04/03/2013  . Medication management 04/03/2013  . Hyperlipidemia 04/03/2013  . Hypertension 05/09/2011  . Depression, major, in remission ([place]) 05/09/2011  . GERD 05/09/2011    Screening Tests Immunization History  Administered Date(s) Administered  . DT 08/10/2013  . Influenza Split 04/04/2013, 04/03/2014, 03/08/2015  . Influenza,inj,quad, With Preservative 03/25/2016  . Pneumococcal-Unspecified 01/17/2006    Preventative care: Last colonoscopy: 2012 due 2022 Last mammogram: Dr. Alden Hipp 02/2016 Last pap smear/pelvic exam: Dr. Alden Hipp 02/2016 DEXA:N/A 01/2012 Myoview stress  test  Prior vaccinations: TD or Tdap: 2015  Influenza: 2017 Pneumococcal: 2007 Prevnar 13: N/A Shingles/Zostavax: declines  Allergies Allergies  Allergen Reactions  . Codeine Palpitations and Other (See Comments)    Makes her head feel "weird"  . Glucophage [Metformin Hcl] Diarrhea  . Nsaids Other (See Comments)    "upset stomach"    SURGICAL HISTORY She  has a past surgical history that includes Shoulder arthroscopy w/ rotator cuff repair (2012); Cesarean section (1985); Cholecystectomy (09/02/11); Carpal tunnel release (2001); Tonsillectomy (~ 1970); Dilation and curettage of uterus (1984); Tubal ligation (1985?); Colonoscopy; Cholecystectomy (09/02/2011); ERCP (09/03/2011); and Carpal tunnel release (Left, 2017). FAMILY HISTORY Her family history includes Cancer in her maternal aunt; Diabetes in her mother; Heart attack (age of onset: 66) in her father; Hyperlipidemia in her mother. SOCIAL HISTORY She  reports that she has never smoked. She has never used smokeless tobacco. She reports that she does not drink alcohol or use drugs.  MEDICARE WELLNESS OBJECTIVES: Physical activity: Current Exercise Habits: Home exercise routine, Type of exercise: walking, Time (Minutes): 15, Frequency (Times/Week): 2, Weekly Exercise (Minutes/Week): 30, Intensity: Mild Cardiac risk factors: Cardiac Risk Factors include: advanced age (>63men, >70 women);diabetes mellitus;dyslipidemia;hypertension;sedentary lifestyle Depression/mood screen:   Depression screen [facility] 2/9 08/07/2016  Decreased Interest 0  Down, Depressed, Hopeless 0  PHQ - 2 Score 0    ADLs:  In your present state of health, do you have any difficulty performing the following activities: 08/07/2016  Hearing? N  Vision? N  Difficulty concentrating or making decisions? N  Walking or climbing stairs? N  Dressing or bathing? N  Doing errands, shopping? N  Some recent data might be hidden    Cognitive Testing  Alert? Yes  Normal  Appearance?Yes  Oriented to person? Yes  Place? Yes   Time? Yes  Recall of three objects?  Yes  Can perform simple calculations? Yes  Displays appropriate judgment?Yes  Can read the correct time from a watch face?Yes  EOL planning: Does Patient Have a Medical Advance Directive?: Yes Type of Advance Directive: Healthcare Power of Attorney, Living will [place] in Chart?: No - copy requested   Objective:   Blood pressure 130/82, pulse 88, temperature 97.7 F (36.5 C), resp. rate 16, weight 152 lb 3.2 oz (69 kg), SpO2 97 %. Body mass index is 26.13 kg/m.  General appearance: alert, no distress, WD/WN,  female HEENT: normocephalic, sclerae anicteric, TMs pearly, nares patent, no discharge or erythema, pharynx normal Oral cavity: MMM, no lesions Neck: supple, no lymphadenopathy, no thyromegaly, no masses Heart: RRR, normal S1, S2, no murmurs Lungs: CTA bilaterally, no wheezes, rhonchi, or rales Abdomen: +bs, soft, mild epigastric tenderness, non distended, no masses, no hepatomegaly, no splenomegaly Musculoskeletal: nontender, no swelling, no obvious deformity Extremities: no  edema, no cyanosis, no clubbing Pulses: 2+ symmetric, upper and lower extremities, normal cap refill Neurological: alert, oriented x 3, CN2-12 intact, strength normal upper extremities and lower extremities, sensation normal bilateral feet, DTRs 2+ throughout, no cerebellar signs, gait normal Psychiatric: normal affect, behavior normal, pleasant  Breast: defer Gyn: defer Rectal: defer  Medicare Attestation I have personally reviewed: The patient's medical and social history Their use of alcohol, tobacco or illicit drugs Their current medications and supplements The patient's functional ability including ADLs,fall risks, home safety risks, cognitive, and hearing and visual impairment Diet and physical activities Evidence for depression or mood disorders  The patient's weight,  height, BMI, and visual acuity have been recorded in the chart.  I have made referrals, counseling, and provided education to the patient based on review of the above and I have provided the patient with a written personalized care plan for preventive services.     Vicie Mutters, PA-C   08/07/2016

## 2016-08-07 NOTE — Patient Instructions (Signed)

## 2016-08-08 LAB — CBC WITH DIFFERENTIAL/PLATELET
Basophils Absolute: 90 cells/uL (ref 0–200)
Basophils Relative: 1 %
EOS PCT: 3 %
Eosinophils Absolute: 270 cells/uL (ref 15–500)
HCT: 37.9 % (ref 35.0–45.0)
Hemoglobin: 12.5 g/dL (ref 11.7–15.5)
LYMPHS ABS: 2610 {cells}/uL (ref 850–3900)
LYMPHS PCT: 29 %
MCH: 27.1 pg (ref 27.0–33.0)
MCHC: 33 g/dL (ref 32.0–36.0)
MCV: 82 fL (ref 80.0–100.0)
MPV: 10 fL (ref 7.5–12.5)
Monocytes Absolute: 720 cells/uL (ref 200–950)
Monocytes Relative: 8 %
NEUTROS PCT: 59 %
Neutro Abs: 5310 cells/uL (ref 1500–7800)
PLATELETS: 450 10*3/uL — AB (ref 140–400)
RBC: 4.62 MIL/uL (ref 3.80–5.10)
RDW: 15 % (ref 11.0–15.0)
WBC: 9 10*3/uL (ref 3.8–10.8)

## 2016-08-08 LAB — HEPATIC FUNCTION PANEL
ALT: 23 U/L (ref 6–29)
AST: 26 U/L (ref 10–35)
Albumin: 4.3 g/dL (ref 3.6–5.1)
Alkaline Phosphatase: 58 U/L (ref 33–130)
BILIRUBIN DIRECT: 0.1 mg/dL (ref <=0.2)
BILIRUBIN INDIRECT: 0.3 mg/dL (ref 0.2–1.2)
TOTAL PROTEIN: 7 g/dL (ref 6.1–8.1)
Total Bilirubin: 0.4 mg/dL (ref 0.2–1.2)

## 2016-08-08 LAB — BASIC METABOLIC PANEL WITH GFR
BUN: 10 mg/dL (ref 7–25)
CALCIUM: 9.2 mg/dL (ref 8.6–10.4)
CO2: 21 mmol/L (ref 20–31)
Chloride: 103 mmol/L (ref 98–110)
Creat: 0.66 mg/dL (ref 0.50–0.99)
Glucose, Bld: 145 mg/dL — ABNORMAL HIGH (ref 65–99)
Potassium: 4.2 mmol/L (ref 3.5–5.3)
SODIUM: 139 mmol/L (ref 135–146)

## 2016-08-08 LAB — LIPID PANEL
CHOL/HDL RATIO: 2.5 ratio (ref <5.0)
CHOLESTEROL: 175 mg/dL (ref <200)
HDL: 69 mg/dL (ref >50)
LDL Cholesterol: 64 mg/dL (ref <100)
Triglycerides: 211 mg/dL — ABNORMAL HIGH (ref <150)
VLDL: 42 mg/dL — AB (ref <30)

## 2016-08-08 LAB — TSH: TSH: 1.48 mIU/L

## 2016-08-08 LAB — HEMOGLOBIN A1C
Hgb A1c MFr Bld: 6.9 % — ABNORMAL HIGH (ref <5.7)
Mean Plasma Glucose: 151 mg/dL

## 2016-08-08 LAB — MAGNESIUM: MAGNESIUM: 2 mg/dL (ref 1.5–2.5)

## 2016-08-08 LAB — VITAMIN D 25 HYDROXY (VIT D DEFICIENCY, FRACTURES): Vit D, 25-Hydroxy: 68 ng/mL (ref 30–100)

## 2016-11-01 ENCOUNTER — Other Ambulatory Visit: Payer: Self-pay | Admitting: Internal Medicine

## 2016-11-10 ENCOUNTER — Other Ambulatory Visit: Payer: Self-pay | Admitting: Internal Medicine

## 2016-11-11 NOTE — Progress Notes (Signed)
Assessment and Plan:   Essential hypertension - continue medications, DASH diet, exercise and monitor at home. Call if greater than 130/80.  -     CBC with Differential/Platelet -     BASIC METABOLIC PANEL WITH GFR -     Hepatic function panel -     TSH  OSA (obstructive sleep apnea) Get on CPAP, call DME, ask about Halo  Insulin-requiring or dependent type II diabetes mellitus ([place]) Discussed general issues about diabetes pathophysiology and management., Educational material distributed., Suggested low cholesterol diet., Encouraged aerobic exercise., Discussed foot care., Reminded to get yearly retinal exam. -     Hemoglobin A1c  Diabetic polyneuropathy associated with type 2 diabetes mellitus ([place]) Declines meds -     Hemoglobin A1c  Depression, major, in remission ([place]) - continue medications, stress management techniques discussed, increase water, good sleep hygiene discussed, increase exercise, and increase veggies.   Medication management -     Magnesium  Hyperlipidemia -continue medications, check lipids, decrease fatty foods, increase activity.  -     Lipid panel  Encounter for long-term (current) use of insulin ([facility])  Myalgia With recent statin addition check, likely more OA -     CK  Continue diet and meds as discussed. Further disposition pending results of labs. Future Appointments Date Time Provider [place]  03/10/2017 2:00 PM Unk Pinto, MD [facility] None    HPI 61 y.o. female  presents for 3 month follow up with hypertension, hyperlipidemia, prediabetes and vitamin D.   Her blood pressure has been controlled at home, today their BP is BP: 124/80.   She does not workout. She denies chest pain, shortness of breath, dizziness.  She reports that she hasn't been exercising.     She is on cholesterol medication, pravastatin 40mg  and denies myalgias. Her cholesterol is at goal. The cholesterol last visit was:   Lab Results  Component Value  Date   CHOL 175 08/07/2016   HDL 69 08/07/2016   LDLCALC 64 08/07/2016   TRIG 211 (H) 08/07/2016   CHOLHDL 2.5 08/07/2016    She has been working on diet and exercise for diabetes, she is on lantus 12 units a day, kombiglyze XR 2.09/998 BIDshe does not check her sugars, she is on ARB, and denies foot ulcerations, hyperglycemia, hypoglycemia , increased appetite, nausea, paresthesia of the feet, polydipsia, polyuria, visual disturbances, vomiting and weight loss. Last A1C in the office was:  Lab Results  Component Value Date   HGBA1C 6.9 (H) 08/07/2016   Lab Results  Component Value Date   GFRNONAA >89 08/07/2016   Patient is on Vitamin D supplement.  Lab Results  Component Value Date   VD25OH 68 08/07/2016     BMI is Body mass index is 25.58 kg/m., she is working on diet and exercise. Wt Readings from Last 3 Encounters:  11/12/16 149 lb (67.6 kg)  08/07/16 152 lb 3.2 oz (69 kg)  06/23/16 152 lb (68.9 kg)    Current Medications:  Current Outpatient Prescriptions on File Prior to Visit  Medication Sig Dispense Refill  . acetaminophen (TYLENOL) 500 MG tablet Take 500-1,000 mg by mouth every 4 (four) hours as needed for mild pain, moderate pain, fever or headache. For pain     . amitriptyline (ELAVIL) 25 MG tablet 1/2-2 at night for nerve pain 60 tablet 0  . azelastine (ASTELIN) 0.1 % nasal spray Place 2 sprays into both nostrils 2 (two) times daily. Use in each nostril as directed 30 mL  2  . B-D ULTRAFINE III SHORT PEN 31G X 8 MM MISC USE AS DIRECTED  WITH  LANTUS  INJECTIONS 90 each 3  . buPROPion (WELLBUTRIN XL) 300 MG 24 hr tablet Take 300 mg by mouth daily.     . Cholecalciferol (VITAMIN D) 2000 units tablet Take 2,000 Units by mouth daily with breakfast.    . diclofenac sodium (VOLTAREN) 1 % GEL Apply 2 g topically 4 (four) times daily. (Patient taking differently: Apply 2 g topically 4 (four) times daily as needed (for pain). ) 100 g 2  . hydrocortisone (PROCTO-PAK) 1 % CREA  Apply once or twice daily as needed for hemorrhoids 28.35 g 3  . KOMBIGLYZE XR 2.09-998 MG TB24 TAKE 1 TABLET TWICE DAILY WITH A MEAL 180 tablet 1  . LANTUS SOLOSTAR 100 UNIT/ML Solostar Pen INJECT  8 UNITS SUBCUTANEOUSLY EVERY DAY AS DIRECTED OR AS DIRECTED BY PRIMARY CARE PHYSICIAN (Patient taking differently: INJECT  12 UNITS SUBCUTANEOUSLY EVERY night at bedtime) 15 mL 2  . losartan (COZAAR) 100 MG tablet Take 1/2 to 1 tablet daily as directed for BP & Kidney Protection (Patient taking differently: Take 50 mg by mouth daily with breakfast. for BP & Kidney Protection) 90 tablet 4  . meloxicam (MOBIC) 7.5 MG tablet Take 1 tablet (7.5 mg total) by mouth 2 (two) times daily as needed for pain. 60 tablet 0  . omeprazole (PRILOSEC) 40 MG capsule TAKE 1 CAPSULE EVERY DAY (Patient taking differently: TAKE 1 CAPSULE EVERY morning) 90 capsule 1  . oxyCODONE-acetaminophen (PERCOCET) 5-325 MG tablet Take 1 tablet by mouth every 6 (six) hours as needed for severe pain. 10 tablet 0  . pravastatin (PRAVACHOL) 40 MG tablet TAKE 1 TABLET EVERY DAY 90 tablet 1  . Pyridoxine HCl (VITAMIN B-6 PO) Take 1 tablet by mouth daily with breakfast.    . ranitidine (ZANTAC) 300 MG capsule Take 1 capsule (300 mg total) by mouth every evening. (Patient taking differently: Take 300 mg by mouth daily. ) 90 capsule 1  . triamcinolone ointment (KENALOG) 0.1 % Apply 1 application topically 2 (two) times daily. 80 g 1  . venlafaxine XR (EFFEXOR-XR) 75 MG 24 hr capsule Take 75 mg by mouth daily with breakfast.     . zolpidem (AMBIEN) 10 MG tablet Take 10 mg by mouth at bedtime.      No current facility-administered medications on file prior to visit.     Medical History:  Past Medical History:  Diagnosis Date  . Anxiety   . Chest pain 04/2011   saw Dr Ron Parker..not heart related  sent to GI dr. and found gallstones.  . Complication of anesthesia 2001; 20012   "slow to wake up"  . Depression   . GERD (gastroesophageal reflux  disease)   . H/O hiatal hernia   . Hypercholesteremia   . Hypertension   . Mental disorder   . Renal disorder   . Type II diabetes mellitus ([place]) 2003    Allergies:  Allergies  Allergen Reactions  . Codeine Palpitations and Other (See Comments)    Makes her head feel "weird"  . Glucophage [Metformin Hcl] Diarrhea  . Nsaids Other (See Comments)    "upset stomach"     Review of Systems:  Review of Systems  Constitutional: Negative for chills, fever and malaise/fatigue.  HENT: Negative for congestion, ear pain and sore throat.   Eyes: Negative.   Respiratory: Negative.   Cardiovascular: Negative for chest pain, palpitations and leg swelling.  Gastrointestinal: Negative for blood in stool, constipation, diarrhea, heartburn and melena.  Genitourinary: Negative.   Skin: Negative.   Neurological: Negative for dizziness, loss of consciousness and headaches.  Psychiatric/Behavioral: Negative for depression. The patient is not nervous/anxious and does not have insomnia.     Family history- Review and unchanged  Social history- Review and unchanged  Physical Exam: BP 124/80   Pulse 85   Temp 97.7 F (36.5 C)   Resp 14   Ht 5\' 4"  (1.626 m)   Wt 149 lb (67.6 kg)   SpO2 98%   BMI 25.58 kg/m  Wt Readings from Last 3 Encounters:  11/12/16 149 lb (67.6 kg)  08/07/16 152 lb 3.2 oz (69 kg)  06/23/16 152 lb (68.9 kg)    General Appearance: Well nourished well developed, in no apparent distress. Eyes: PERRLA, EOMs, conjunctiva no swelling or erythema ENT/Mouth: Ear canals normal without obstruction, swelling, erythma, discharge.  TMs normal bilaterally.  Oropharynx moist, clear, without exudate, or postoropharyngeal swelling. Neck: Supple, thyroid normal,no cervical adenopathy  Respiratory: Respiratory effort normal, Breath sounds clear A&P without rhonchi, wheeze, or rale.  No retractions, no accessory usage. Cardio: RRR with no MRGs. Brisk peripheral pulses without edema.   Abdomen: Soft, + BS,  Non tender, no guarding, rebound, hernias, masses. Musculoskeletal: Full ROM, 5/5 strength, Normal gait Skin: Warm, dry without rashes, lesions, ecchymosis.  Neuro: Awake and oriented X 3, Cranial nerves intact. Normal muscle tone, no cerebellar symptoms. Psych: Normal affect, Insight and Judgment appropriate.    Vicie Mutters, PA-C 3:46 PM [facility]

## 2016-11-12 ENCOUNTER — Ambulatory Visit (INDEPENDENT_AMBULATORY_CARE_PROVIDER_SITE_OTHER): Payer: Medicare HMO | Admitting: Physician Assistant

## 2016-11-12 ENCOUNTER — Encounter: Payer: Self-pay | Admitting: Physician Assistant

## 2016-11-12 VITALS — BP 124/80 | HR 85 | Temp 97.7°F | Resp 14 | Ht 64.0 in | Wt 149.0 lb

## 2016-11-12 DIAGNOSIS — E1142 Type 2 diabetes mellitus with diabetic polyneuropathy: Secondary | ICD-10-CM

## 2016-11-12 DIAGNOSIS — I10 Essential (primary) hypertension: Secondary | ICD-10-CM

## 2016-11-12 DIAGNOSIS — E782 Mixed hyperlipidemia: Secondary | ICD-10-CM | POA: Diagnosis not present

## 2016-11-12 DIAGNOSIS — M791 Myalgia, unspecified site: Secondary | ICD-10-CM

## 2016-11-12 DIAGNOSIS — E119 Type 2 diabetes mellitus without complications: Secondary | ICD-10-CM | POA: Diagnosis not present

## 2016-11-12 DIAGNOSIS — G4733 Obstructive sleep apnea (adult) (pediatric): Secondary | ICD-10-CM

## 2016-11-12 DIAGNOSIS — F325 Major depressive disorder, single episode, in full remission: Secondary | ICD-10-CM | POA: Diagnosis not present

## 2016-11-12 DIAGNOSIS — Z79899 Other long term (current) drug therapy: Secondary | ICD-10-CM | POA: Diagnosis not present

## 2016-11-12 DIAGNOSIS — Z794 Long term (current) use of insulin: Secondary | ICD-10-CM

## 2016-11-12 LAB — CBC WITH DIFFERENTIAL/PLATELET
BASOS ABS: 92 {cells}/uL (ref 0–200)
BASOS PCT: 1 %
EOS ABS: 736 {cells}/uL — AB (ref 15–500)
Eosinophils Relative: 8 %
HEMATOCRIT: 37.9 % (ref 35.0–45.0)
HEMOGLOBIN: 12.4 g/dL (ref 11.7–15.5)
LYMPHS ABS: 2392 {cells}/uL (ref 850–3900)
Lymphocytes Relative: 26 %
MCH: 27 pg (ref 27.0–33.0)
MCHC: 32.7 g/dL (ref 32.0–36.0)
MCV: 82.4 fL (ref 80.0–100.0)
MONO ABS: 644 {cells}/uL (ref 200–950)
MPV: 9.2 fL (ref 7.5–12.5)
Monocytes Relative: 7 %
NEUTROS ABS: 5336 {cells}/uL (ref 1500–7800)
Neutrophils Relative %: 58 %
Platelets: 400 10*3/uL (ref 140–400)
RBC: 4.6 MIL/uL (ref 3.80–5.10)
RDW: 15 % (ref 11.0–15.0)
WBC: 9.2 10*3/uL (ref 3.8–10.8)

## 2016-11-12 LAB — TSH: TSH: 1.51 mIU/L

## 2016-11-12 NOTE — Patient Instructions (Addendum)
Think about doing Halo mask for CPAP.   I think it is possible that you have sleep apnea. It can cause interrupted sleep, headaches, frequent awakenings, fatigue, dry mouth, fast/slow heart beats, memory issues, anxiety/depression, swelling, numbness tingling hands/feet, weight gain, shortness of breath, and the list goes on. Sleep apnea needs to be ruled out because if it is left untreated it does eventually lead to abnormal heart beats, lung failure or heart failure as well as increasing the risk of heart attack and stroke. There are masks you can wear OR a mouth piece that I can give you information about. Often times though people feel MUCH better after getting treatment.   Sleep Apnea  Sleep apnea is a sleep disorder characterized by abnormal pauses in breathing while you sleep. When your breathing pauses, the level of oxygen in your blood decreases. This causes you to move out of deep sleep and into light sleep. As a result, your quality of sleep is poor, and the system that carries your blood throughout your body (cardiovascular system) experiences stress. If sleep apnea remains untreated, the following conditions can develop:  High blood pressure (hypertension).  Coronary artery disease.  Inability to achieve or maintain an erection (impotence).  Impairment of your thought process (cognitive dysfunction). There are three types of sleep apnea: 1. Obstructive sleep apnea--Pauses in breathing during sleep because of a blocked airway. 2. Central sleep apnea--Pauses in breathing during sleep because the area of the brain that controls your breathing does not send the correct signals to the muscles that control breathing. 3. Mixed sleep apnea--A combination of both obstructive and central sleep apnea.  RISK FACTORS The following risk factors can increase your risk of developing sleep apnea:  Being overweight.  Smoking.  Having narrow passages in your nose and throat.  Being of older  age.  Being female.  Alcohol use.  Sedative and tranquilizer use.  Ethnicity. Among individuals younger than 35 years, African Americans are at increased risk of sleep apnea. SYMPTOMS   Difficulty staying asleep.  Daytime sleepiness and fatigue.  Loss of energy.  Irritability.  Loud, heavy snoring.  Morning headaches.  Trouble concentrating.  Forgetfulness.  Decreased interest in sex. DIAGNOSIS  In order to diagnose sleep apnea, your caregiver will perform a physical examination. Your caregiver may suggest that you take a home sleep test. Your caregiver may also recommend that you spend the night in a sleep lab. In the sleep lab, several monitors record information about your heart, lungs, and brain while you sleep. Your leg and arm movements and blood oxygen level are also recorded. TREATMENT The following actions may help to resolve mild sleep apnea:  Sleeping on your side.   Using a decongestant if you have nasal congestion.   Avoiding the use of depressants, including alcohol, sedatives, and narcotics.   Losing weight and modifying your diet if you are overweight. There also are devices and treatments to help open your airway:  Oral appliances. These are custom-made mouthpieces that shift your lower jaw forward and slightly open your bite. This opens your airway.  Devices that create positive airway pressure. This positive pressure "splints" your airway open to help you breathe better during sleep. The following devices create positive airway pressure:  Continuous positive airway pressure (CPAP) device. The CPAP device creates a continuous level of air pressure with an air pump. The air is delivered to your airway through a mask while you sleep. This continuous pressure keeps your airway open.  Nasal  expiratory positive airway pressure (EPAP) device. The EPAP device creates positive air pressure as you exhale. The device consists of single-use valves, which are  inserted into each nostril and held in place by adhesive. The valves create very little resistance when you inhale but create much more resistance when you exhale. That increased resistance creates the positive airway pressure. This positive pressure while you exhale keeps your airway open, making it easier to breath when you inhale again.  Bilevel positive airway pressure (BPAP) device. The BPAP device is used mainly in patients with central sleep apnea. This device is similar to the CPAP device because it also uses an air pump to deliver continuous air pressure through a mask. However, with the BPAP machine, the pressure is set at two different levels. The pressure when you exhale is lower than the pressure when you inhale.  Surgery. Typically, surgery is only done if you cannot comply with less invasive treatments or if the less invasive treatments do not improve your condition. Surgery involves removing excess tissue in your airway to create a wider passage way. Document Released: 04/25/2002 Document Revised: 08/30/2012 Document Reviewed: 09/11/2011 [facility] Patient Information 2015 [place], [place]. This information is not intended to replace advice given to you by your health care provider. Make sure you discuss any questions you have with your health care provider.

## 2016-11-13 LAB — BASIC METABOLIC PANEL WITH GFR
BUN: 8 mg/dL (ref 7–25)
CHLORIDE: 99 mmol/L (ref 98–110)
CO2: 21 mmol/L (ref 20–31)
Calcium: 9.5 mg/dL (ref 8.6–10.4)
Creat: 0.67 mg/dL (ref 0.50–0.99)
GFR, Est African American: >89 mL/min (ref >=60)
GFR, Est Non African American: >89 mL/min (ref >=60)
GLUCOSE: 180 mg/dL — AB (ref 65–99)
POTASSIUM: 4.1 mmol/L (ref 3.5–5.3)
Sodium: 139 mmol/L (ref 135–146)

## 2016-11-13 LAB — HEPATIC FUNCTION PANEL
ALBUMIN: 4.2 g/dL (ref 3.6–5.1)
ALK PHOS: 71 U/L (ref 33–130)
ALT: 24 U/L (ref 6–29)
AST: 24 U/L (ref 10–35)
BILIRUBIN TOTAL: 0.3 mg/dL (ref 0.2–1.2)
Bilirubin, Direct: 0.1 mg/dL (ref <=0.2)
Indirect Bilirubin: 0.2 mg/dL (ref 0.2–1.2)
Total Protein: 7.1 g/dL (ref 6.1–8.1)

## 2016-11-13 LAB — LIPID PANEL
CHOL/HDL RATIO: 2.8 ratio (ref <5.0)
Cholesterol: 200 mg/dL — ABNORMAL HIGH (ref <200)
HDL: 71 mg/dL (ref >50)
LDL CALC: 86 mg/dL (ref <100)
Triglycerides: 217 mg/dL — ABNORMAL HIGH (ref <150)
VLDL: 43 mg/dL — ABNORMAL HIGH (ref <30)

## 2016-11-13 LAB — HEMOGLOBIN A1C
HEMOGLOBIN A1C: 7.2 % — AB (ref <5.7)
Mean Plasma Glucose: 160 mg/dL

## 2016-11-13 LAB — CK: CK TOTAL: 48 U/L (ref 29–143)

## 2016-11-13 LAB — MAGNESIUM: Magnesium: 1.8 mg/dL (ref 1.5–2.5)

## 2016-12-03 ENCOUNTER — Other Ambulatory Visit: Payer: Self-pay | Admitting: Internal Medicine

## 2016-12-03 DIAGNOSIS — K219 Gastro-esophageal reflux disease without esophagitis: Secondary | ICD-10-CM

## 2017-02-17 ENCOUNTER — Encounter: Payer: Self-pay | Admitting: Adult Health

## 2017-02-17 ENCOUNTER — Other Ambulatory Visit: Payer: Self-pay | Admitting: Internal Medicine

## 2017-02-17 ENCOUNTER — Ambulatory Visit (INDEPENDENT_AMBULATORY_CARE_PROVIDER_SITE_OTHER): Payer: Medicare HMO | Admitting: Adult Health

## 2017-02-17 VITALS — BP 114/72 | HR 76 | Temp 97.4°F | Resp 18 | Ht 64.0 in | Wt 148.4 lb

## 2017-02-17 DIAGNOSIS — R059 Cough, unspecified: Secondary | ICD-10-CM

## 2017-02-17 DIAGNOSIS — Z23 Encounter for immunization: Secondary | ICD-10-CM

## 2017-02-17 DIAGNOSIS — R05 Cough: Secondary | ICD-10-CM | POA: Diagnosis not present

## 2017-02-17 DIAGNOSIS — J069 Acute upper respiratory infection, unspecified: Secondary | ICD-10-CM

## 2017-02-17 MED ORDER — HYDROCODONE-HOMATROPINE 5-1.5 MG/5ML PO SYRP: 5.0000 mL | ORAL_SOLUTION | Freq: Four times a day (QID) | ORAL | 0 refills | Status: DC | PRN

## 2017-02-17 NOTE — Addendum Note (Signed)
Addended by: Melbourne Abts C on: 02/17/2017 05:25 PM   Modules accepted: Orders

## 2017-02-17 NOTE — Patient Instructions (Signed)

## 2017-02-17 NOTE — Progress Notes (Signed)
Assessment and Plan:  Mindy Richard was seen today for uri.  Diagnoses and all orders for this visit:  Viral upper respiratory illness -     HYDROcodone-homatropine (HYCODAN) 5-1.5 MG/5ML syrup; Take 5 mLs by mouth every 6 (six) hours as needed for cough.  Cough -     HYDROcodone-homatropine (HYCODAN) 5-1.5 MG/5ML syrup; Take 5 mLs by mouth every 6 (six) hours as needed for cough.  Benign exam in office other than injected pharynx and ongoing cough- duration of 7 days at this point; discussed likely viral etiology with patient, recommend supportive treatment at this time. Cough is main concern for patient; codeine not tolerated in the past, benzonatate reportedly not very helpful. She has reportedly tolerated hydrocodone in the past; hycodan provided today for severe cough. Information on supportive treatment for viral illness provided; instructed to message/call before the weekend should she not continue to improve over the next few days.   Further disposition pending results of labs. Discussed med's effects and SE's.   Over 15 minutes of exam, counseling, chart review, and critical decision making was performed.   Future Appointments Date Time Provider [place]  03/10/2017 2:00 PM Unk Pinto, MD [facility] None    ------------------------------------------------------------------------------------------------------------------   HPI 61 y.o.female presents c/o URI symptoms ongoing for 1 week.   The patient her symptoms started with a mild cough with HA x1 week ago. She reports the HA has improved significantly and now is just mild/intermittent which she is managing well with OTC analgesics. However, cough has remained and is worsening- describes a "deep" unproductive cough. Denies CP, SOB, wheezing- endorses heaviness, but states she can get a deep breath. She endorses a mild temp at home. Has taken OTC cold medication with guaifenesin with some success. Husband at home with  similar symptoms. Her main concern today is the nagging cough.  She reports she does not tolerate codeine well; has taken benzonatate earlier this year for a while but did not feel it was beneficial for her. She reports she has tolerated hydrocodone in the past.   Denies seasonal allergies.   Past Medical History:  Diagnosis Date  . Anxiety   . Chest pain 04/2011   saw Dr Ron Parker..not heart related  sent to GI dr. and found gallstones.  . Complication of anesthesia 2001; 20012   "slow to wake up"  . Depression   . GERD (gastroesophageal reflux disease)   . H/O hiatal hernia   . Hypercholesteremia   . Hypertension   . Mental disorder   . Renal disorder   . Type II diabetes mellitus ([place]) 2003     Allergies  Allergen Reactions  . Codeine Palpitations and Other (See Comments)    Makes her head feel "weird"  . Glucophage [Metformin Hcl] Diarrhea  . Nsaids Other (See Comments)    "upset stomach"    Current Outpatient Prescriptions on File Prior to Visit  Medication Sig  . acetaminophen (TYLENOL) 500 MG tablet Take 500-1,000 mg by mouth every 4 (four) hours as needed for mild pain, moderate pain, fever or headache. For pain   . amitriptyline (ELAVIL) 25 MG tablet 1/2-2 at night for nerve pain  . azelastine (ASTELIN) 0.1 % nasal spray Place 2 sprays into both nostrils 2 (two) times daily. Use in each nostril as directed  . B-D ULTRAFINE III SHORT PEN 31G X 8 MM MISC USE AS DIRECTED  WITH  LANTUS  INJECTIONS  . buPROPion (WELLBUTRIN XL) 300 MG 24 hr tablet Take 300 mg  by mouth daily.   . Cholecalciferol (VITAMIN D) 2000 units tablet Take 2,000 Units by mouth daily with breakfast.  . diclofenac sodium (VOLTAREN) 1 % GEL Apply 2 g topically 4 (four) times daily. (Patient taking differently: Apply 2 g topically 4 (four) times daily as needed (for pain). )  . hydrocortisone (PROCTO-PAK) 1 % CREA Apply once or twice daily as needed for hemorrhoids  . KOMBIGLYZE XR 2.09-998 MG TB24 TAKE 1  TABLET TWICE DAILY WITH A MEAL  . losartan (COZAAR) 100 MG tablet TAKE 1/2 TO 1 TABLET DAILY AS DIRECTED FOR BLOOD PRESSURE AND KIDNEY PROTECTION  . meloxicam (MOBIC) 7.5 MG tablet Take 1 tablet (7.5 mg total) by mouth 2 (two) times daily as needed for pain.  Marland Kitchen omeprazole (PRILOSEC) 40 MG capsule TAKE 1 CAPSULE EVERY DAY  . pravastatin (PRAVACHOL) 40 MG tablet TAKE 1 TABLET EVERY DAY  . Pyridoxine HCl (VITAMIN B-6 PO) Take 1 tablet by mouth daily with breakfast.  . ranitidine (ZANTAC) 300 MG capsule Take 1 capsule (300 mg total) by mouth every evening. (Patient taking differently: Take 300 mg by mouth daily. )  . triamcinolone ointment (KENALOG) 0.1 % Apply 1 application topically 2 (two) times daily.  Marland Kitchen venlafaxine XR (EFFEXOR-XR) 75 MG 24 hr capsule Take 75 mg by mouth daily with breakfast.   . zolpidem (AMBIEN) 10 MG tablet Take 10 mg by mouth at bedtime.    No current facility-administered medications on file prior to visit.     ROS: Review of Systems  Constitutional: Positive for malaise/fatigue. Negative for chills, diaphoresis and fever.  HENT: Positive for sore throat. Negative for congestion, ear discharge, ear pain, hearing loss, sinus pain and tinnitus.   Eyes: Negative for blurred vision.  Respiratory: Negative for cough, hemoptysis, sputum production, shortness of breath and wheezing.   Cardiovascular: Negative for chest pain, palpitations, orthopnea and leg swelling.  Gastrointestinal: Negative for abdominal pain, heartburn, nausea and vomiting.  Genitourinary: Negative.   Musculoskeletal: Negative for myalgias.  Skin: Negative.  Negative for rash.  Neurological: Positive for headaches (Mild, significantly improved, now intermittent). Negative for dizziness and sensory change.  Endo/Heme/Allergies: Negative.  Negative for environmental allergies.  Psychiatric/Behavioral: Negative.      Physical Exam:  BP 114/72   Pulse 76   Temp (!) 97.4 F (36.3 C)   Resp 18   Ht  5\' 4"  (1.626 m)   Wt 148 lb 6.4 oz (67.3 kg)   BMI 25.47 kg/m   General Appearance: Well nourished, in no apparent distress. Eyes: PERRLA, conjunctiva no swelling or erythema Sinuses: No Frontal/maxillary tenderness ENT/Mouth: Ext aud canals clear, TMs without erythema, bulging, bilateral effusions present. Posterior pharynx injected, without notable swelling, or exudate on post pharynx.  Tonsils absent. Hearing normal.   Neck: Supple, thyroid normal.  Respiratory: Respiratory effort normal, BS equal bilaterally without rhonchi, wheezing or stridor.  Scant scattered fine crackles heard bilaterally.  Cardio: RRR with no MRGs. Brisk peripheral pulses without edema.  Abdomen: Soft, + BS.  Non tender, no palpable masses. Lymphatics: Non tender without lymphadenopathy.  Skin: Warm, dry without rashes, lesions, ecchymosis.   Neuro: Normal muscle tone, no cerebellar symptoms.  Psych: Awake and oriented X 3, normal affect, Insight and Judgment appropriate.     Izora Ribas, NP 4:20 PM [facility]

## 2017-02-22 ENCOUNTER — Encounter: Payer: Self-pay | Admitting: Adult Health

## 2017-02-22 DIAGNOSIS — R509 Fever, unspecified: Secondary | ICD-10-CM

## 2017-02-22 DIAGNOSIS — R05 Cough: Secondary | ICD-10-CM

## 2017-02-22 DIAGNOSIS — R059 Cough, unspecified: Secondary | ICD-10-CM

## 2017-02-22 DIAGNOSIS — J069 Acute upper respiratory infection, unspecified: Secondary | ICD-10-CM

## 2017-02-25 MED ORDER — AZITHROMYCIN 250 MG PO TABS: ORAL_TABLET | ORAL | 1 refills | Status: DC

## 2017-02-25 MED ORDER — PREDNISONE 20 MG PO TABS
ORAL_TABLET | ORAL | 0 refills | Status: DC
Start: 2017-02-25 — End: 2017-02-27

## 2017-02-27 ENCOUNTER — Other Ambulatory Visit: Payer: Self-pay | Admitting: Adult Health

## 2017-02-27 DIAGNOSIS — R509 Fever, unspecified: Secondary | ICD-10-CM

## 2017-02-27 DIAGNOSIS — J069 Acute upper respiratory infection, unspecified: Secondary | ICD-10-CM

## 2017-02-27 MED ORDER — PREDNISONE 20 MG PO TABS: ORAL_TABLET | ORAL | 0 refills | Status: DC

## 2017-02-27 MED ORDER — AZITHROMYCIN 250 MG PO TABS: ORAL_TABLET | ORAL | 1 refills | Status: AC

## 2017-03-10 ENCOUNTER — Encounter: Payer: Self-pay | Admitting: Adult Health

## 2017-03-10 ENCOUNTER — Other Ambulatory Visit: Payer: Self-pay | Admitting: Internal Medicine

## 2017-03-10 ENCOUNTER — Ambulatory Visit (INDEPENDENT_AMBULATORY_CARE_PROVIDER_SITE_OTHER): Payer: Medicare HMO | Admitting: Internal Medicine

## 2017-03-10 ENCOUNTER — Encounter: Payer: Self-pay | Admitting: Internal Medicine

## 2017-03-10 VITALS — BP 106/68 | HR 84 | Temp 97.0°F | Resp 18 | Ht 64.0 in | Wt 151.4 lb

## 2017-03-10 DIAGNOSIS — Z79899 Other long term (current) drug therapy: Secondary | ICD-10-CM | POA: Diagnosis not present

## 2017-03-10 DIAGNOSIS — Z1212 Encounter for screening for malignant neoplasm of rectum: Secondary | ICD-10-CM

## 2017-03-10 DIAGNOSIS — G4733 Obstructive sleep apnea (adult) (pediatric): Secondary | ICD-10-CM | POA: Diagnosis not present

## 2017-03-10 DIAGNOSIS — E559 Vitamin D deficiency, unspecified: Secondary | ICD-10-CM | POA: Diagnosis not present

## 2017-03-10 DIAGNOSIS — Z136 Encounter for screening for cardiovascular disorders: Secondary | ICD-10-CM

## 2017-03-10 DIAGNOSIS — Z794 Long term (current) use of insulin: Secondary | ICD-10-CM

## 2017-03-10 DIAGNOSIS — E119 Type 2 diabetes mellitus without complications: Secondary | ICD-10-CM

## 2017-03-10 DIAGNOSIS — E1142 Type 2 diabetes mellitus with diabetic polyneuropathy: Secondary | ICD-10-CM

## 2017-03-10 DIAGNOSIS — Z0001 Encounter for general adult medical examination with abnormal findings: Secondary | ICD-10-CM

## 2017-03-10 DIAGNOSIS — Z0000 Encounter for general adult medical examination without abnormal findings: Secondary | ICD-10-CM | POA: Diagnosis not present

## 2017-03-10 DIAGNOSIS — I10 Essential (primary) hypertension: Secondary | ICD-10-CM

## 2017-03-10 DIAGNOSIS — Z1211 Encounter for screening for malignant neoplasm of colon: Secondary | ICD-10-CM

## 2017-03-10 DIAGNOSIS — E782 Mixed hyperlipidemia: Secondary | ICD-10-CM | POA: Diagnosis not present

## 2017-03-10 MED ORDER — PROMETHAZINE-DM 6.25-15 MG/5ML PO SYRP: ORAL_SOLUTION | ORAL | 1 refills | Status: DC

## 2017-03-10 MED ORDER — BENZONATATE 200 MG PO CAPS: ORAL_CAPSULE | ORAL | 2 refills | Status: AC

## 2017-03-10 NOTE — Progress Notes (Signed)
[facility] Unk Pinto, M.D.     Uvaldo Bristle. Silverio Lay, P.A.-C Liane Comber, [place] [address] [place], [place] [address] Telephone [phone] Telefax [phone] Annual Screening/Preventative Visit & Comprehensive Evaluation &  Examination     This very nice 61 y.o. MWF presents for a Screening/Preventative Visit & comprehensive evaluation and management of multiple medical co-morbidities.  Patient has been followed for HTN, T2_NIDDM  , Hyperlipidemia and Vitamin D Deficiency. Patients s GERD is controlled on her meds. She also has hx/o OSA and was intolerant to the CPAP Masks. After a 30# weight loss she alleges snoring and apnea recovered.  Patient has been on SS Disability since 1999 for Depression , Chronic anxiety and Panic attacks.       HTN predates since 2000. Patient's BP has been controlled at home and patient denies any cardiac symptoms as chest pain, palpitations, shortness of breath, dizziness or ankle swelling. In 2012 , she had a Negative Cardiolite scan (EF 88%).  Today's BP is at goal - 106/68.      Patient's hyperlipidemia is controlled with diet and medications. Patient denies myalgias or other medication SE's. Last lipids were at goal albeit elevated Trig's: Lab Results  Component Value Date   CHOL 200 (H) 11/12/2016   HDL 71 11/12/2016   LDLCALC 86 11/12/2016   TRIG 217 (H) 11/12/2016   CHOLHDL 2.8 11/12/2016      Patient has Insulin Requiring T2_NIDDM circa 2001 and alleges FBG's are < 130 mg%.     and patient denies reactive hypoglycemic symptoms, visual blurring, diabetic polys, but does report burning dysthesias of the soles of her feet at night w/recumbancy. Last A1c was not at goal: Lab Results  Component Value Date   HGBA1C 7.2 (H) 11/12/2016      Finally, patient has history of Vitamin D Deficiency ("7" in 2008) and last Vitamin D was at goal: Lab Results  Component Value  Date   VD25OH 68 08/07/2016   Current Outpatient Prescriptions on File Prior to Visit  Medication Sig  . acetaminophen (TYLENOL) 500 MG tablet Take 500-1,000 mg by mouth every 4 (four) hours as needed for mild pain, moderate pain, fever or headache. For pain   . B-D ULTRAFINE III SHORT PEN 31G X 8 MM MISC USE AS DIRECTED  WITH  LANTUS  INJECTIONS  . buPROPion (WELLBUTRIN XL) 300 MG 24 hr tablet Take 300 mg by mouth daily.   . Cholecalciferol (VITAMIN D) 2000 units tablet Take 2,000 Units by mouth daily with breakfast.  . diclofenac sodium (VOLTAREN) 1 % GEL Apply 2 g topically 4 (four) times daily. (Patient taking differently: Apply 2 g topically 4 (four) times daily as needed (for pain). )  . hydrocortisone (PROCTO-PAK) 1 % CREA Apply once or twice daily as needed for hemorrhoids  . KOMBIGLYZE XR 2.09-998 MG TB24 TAKE 1 TABLET TWICE DAILY WITH A MEAL  . LANTUS SOLOSTAR 100 UNIT/ML Solostar Pen INJECT  8 UNITS SUBCUTANEOUSLY EVERY DAY AS DIRECTED OR AS DIRECTED BY PRIMARY CARE PHYSICIAN DISCARD PEN 28 DAYS AFTER OPENING  . losartan (COZAAR) 100 MG tablet TAKE 1/2 TO 1 TABLET DAILY AS DIRECTED FOR BLOOD PRESSURE AND KIDNEY PROTECTION  . meloxicam (MOBIC) 7.5 MG tablet Take 1 tablet (7.5 mg total) by mouth 2 (two) times daily as needed for pain.  Marland Kitchen omeprazole (PRILOSEC) 40 MG capsule TAKE 1 CAPSULE EVERY DAY  . pravastatin (PRAVACHOL) 40 MG tablet TAKE 1  TABLET EVERY DAY  . triamcinolone ointment (KENALOG) 0.1 % Apply 1 application topically 2 (two) times daily.  Marland Kitchen venlafaxine XR (EFFEXOR-XR) 75 MG 24 hr capsule Take 75 mg by mouth daily with breakfast.   . zolpidem (AMBIEN) 10 MG tablet Take 10 mg by mouth at bedtime.    No current facility-administered medications on file prior to visit.    Allergies  Allergen Reactions  . Codeine Palpitations and Other (See Comments)    Makes her head feel "weird"  . Glucophage [Metformin Hcl] Diarrhea  . Nsaids Other (See Comments)    "upset stomach"    Past Medical History:  Diagnosis Date  . Anxiety   . Chest pain 04/2011   saw Dr Ron Parker..not heart related  sent to GI dr. and found gallstones.  . Complication of anesthesia 2001; 20012   "slow to wake up"  . Depression   . GERD (gastroesophageal reflux disease)   . H/O hiatal hernia   . Hypercholesteremia   . Hypertension   . Mental disorder   . Renal disorder   . Type II diabetes mellitus ([place]) 2003   Health Maintenance  Topic Date Due  . PNEUMOCOCCAL POLYSACCHARIDE VACCINE (2) 07/03/2017 (Originally 01/18/2011)  . OPHTHALMOLOGY EXAM  03/11/2017  . HEMOGLOBIN A1C  05/14/2017  . FOOT EXAM  08/07/2017  . MAMMOGRAM  03/06/2018  . PAP SMEAR  02/27/2019  . COLONOSCOPY  03/06/2021  . TETANUS/TDAP  08/11/2023  . INFLUENZA VACCINE  Completed  . Hepatitis C Screening  Completed  . HIV Screening  Completed   Immunization History  Administered Date(s) Administered  . DT 08/10/2013  . Influenza Inj Mdck Quad With Preservative 02/17/2017  . Influenza Split 04/04/2013, 04/03/2014, 03/08/2015  . Influenza,inj,quad, With Preservative 03/25/2016  . Pneumococcal-Unspecified 01/17/2006   Past Surgical History:  Procedure Laterality Date  . CARPAL TUNNEL RELEASE  2001   right  . CARPAL TUNNEL RELEASE Left 2017  . CESAREAN SECTION  1985  . CHOLECYSTECTOMY  09/02/11  . CHOLECYSTECTOMY  09/02/2011   Procedure: LAPAROSCOPIC CHOLECYSTECTOMY WITH INTRAOPERATIVE CHOLANGIOGRAM;  Surgeon: Haywood Lasso, MD;  Location: [facility];  Service: General;  Laterality: N/A;  . COLONOSCOPY    . DILATION AND CURETTAGE OF UTERUS  1984  . ERCP  09/03/2011   Procedure: ENDOSCOPIC RETROGRADE CHOLANGIOPANCREATOGRAPHY (ERCP);  Surgeon: Missy Sabins, MD;  Location: [facility] ENDOSCOPY;  Service: Endoscopy;  Laterality: N/A;  sphincterotomy and stone extraction  . SHOULDER ARTHROSCOPY W/ ROTATOR CUFF REPAIR  2012   left  . TONSILLECTOMY  ~ 1970  . TUBAL LIGATION  1985?   Family History  Problem Relation Age of  Onset  . Cancer Maternal Aunt        breast  . Heart attack Father 47       Smoker  . Diabetes Mother   . Hyperlipidemia Mother   . Anesthesia problems Neg Hx   . Hypotension Neg Hx    Social History  Substance Use Topics  . Smoking status: Never Smoker  . Smokeless tobacco: Never Used  . Alcohol use No    ROS Constitutional: Denies fever, chills, weight loss/gain, headaches, insomnia,  night sweats, and change in appetite. Does c/o fatigue. Eyes: Denies redness, blurred vision, diplopia, discharge, itchy, watery eyes.  ENT: Denies discharge, congestion, post nasal drip, epistaxis, sore throat, earache, hearing loss, dental pain, Tinnitus, Vertigo, Sinus pain, snoring.  Cardio: Denies chest pain, palpitations, irregular heartbeat, syncope, dyspnea, diaphoresis, orthopnea, PND, claudication, edema Respiratory: denies cough, dyspnea,  DOE, pleurisy, hoarseness, laryngitis, wheezing.  Gastrointestinal: Denies dysphagia, heartburn, reflux, water brash, pain, cramps, nausea, vomiting, bloating, diarrhea, constipation, hematemesis, melena, hematochezia, jaundice, hemorrhoids Genitourinary: Denies dysuria, frequency, urgency, nocturia, hesitancy, discharge, hematuria, flank pain Breast: Breast lumps, nipple discharge, bleeding.  Musculoskeletal: Denies arthralgia, myalgia, stiffness, Jt. Swelling, pain, limp, and strain/sprain. Denies falls. Skin: Denies puritis, rash, hives, warts, acne, eczema, changing in skin lesion Neuro: No weakness, tremor, incoordination, spasms, paresthesia, pain Psychiatric: Denies confusion, memory loss, sensory loss. Denies Depression. Endocrine: Denies change in weight, skin, hair change, nocturia, and paresthesia, diabetic polys, visual blurring, hyper / hypo glycemic episodes.  Heme/Lymph: No excessive bleeding, bruising, enlarged lymph nodes.  Physical Exam  BP 106/68   Pulse 84   Temp (!) 97 F (36.1 C)   Resp 18   Ht 5\' 4"  (1.626 m)   Wt 151 lb 6.4  oz (68.7 kg)   BMI 25.99 kg/m   General Appearance: Well nourished, well groomed and in no apparent distress.  Eyes: PERRLA, EOMs, conjunctiva no swelling or erythema, normal fundi and vessels. Sinuses: No frontal/maxillary tenderness ENT/Mouth: EACs patent / TMs  nl. Nares clear without erythema, swelling, mucoid exudates. Oral hygiene is good. No erythema, swelling, or exudate. Tongue normal, non-obstructing. Tonsils not swollen or erythematous. Hearing normal.  Neck: Supple, thyroid normal. No bruits, nodes or JVD. Respiratory: Respiratory effort normal.  BS equal and clear bilateral without rales, rhonci, wheezing or stridor. Cardio: Heart sounds are normal with regular rate and rhythm and no murmurs, rubs or gallops. Peripheral pulses are normal and equal bilaterally without edema. No aortic or femoral bruits. Chest: symmetric with normal excursions and percussion. Breasts: Symmetric, without lumps, nipple discharge, retractions, or fibrocystic changes.  Abdomen: Flat, soft with bowel sounds active. Nontender, no guarding, rebound, hernias, masses, or organomegaly.  Lymphatics: Non tender without lymphadenopathy.  Musculoskeletal: Full ROM all peripheral extremities, joint stability, 5/5 strength, and normal gait. Skin: Warm and dry without rashes, lesions, cyanosis, clubbing or  ecchymosis.  Neuro: Cranial nerves intact, reflexes equal bilaterally. Normal muscle tone, no cerebellar symptoms. Sensation intact to touch, vibratory and Monofilament to the toes bilaterally. Pysch: Alert and oriented X 3, normal affect, Insight and Judgment appropriate.   Assessment and Plan  1. Annual Preventative Screening Examination  2. Essential hypertension  - EKG 12-Lead - Urinalysis, Routine w reflex microscopic - Microalbumin / creatinine urine ratio - CBC with Differential/Platelet - BASIC METABOLIC PANEL WITH GFR - Magnesium - TSH  3. Hyperlipidemia, mixed  - EKG 12-Lead - Hepatic  function panel - Lipid panel - TSH  4. Insulin-requiring or dependent type II diabetes mellitus ([place])  - EKG 12-Lead - Urinalysis, Routine w reflex microscopic - Microalbumin / creatinine urine ratio - Hemoglobin A1c  5. Vitamin D deficiency  - VITAMIN D 25 Hydroxy   6. Diabetic polyneuropathy associated with type 2 diabetes mellitus (HCC)  - HM DIABETES FOOT EXAM - LOW EXTREMITY NEUR EXAM DOCUM  7. OSA (obstructive sleep apnea)   8. Screening for colorectal cancer  - POC Hemoccult Bld/Stl (3-Cd Home Screen); Future  9. Screening for ischemic heart disease  - EKG 12-Lead  10. Medication management  - Urinalysis, Routine w reflex microscopic - Microalbumin / creatinine urine ratio - CBC with Differential/Platelet - BASIC METABOLIC PANEL WITH GFR - Hepatic function panel - Magnesium - Lipid panel - TSH - Hemoglobin A1c - VITAMIN D 25 Hydroxy         Patient was counseled in prudent diet to  achieve/maintain BMI less than 25 for weight control, BP monitoring, regular exercise and medications. Discussed med's effects and SE's. Screening labs and tests as requested with regular follow-up as recommended. Over 40 minutes of exam, counseling, chart review and high complex critical decision making was performed.

## 2017-03-10 NOTE — Patient Instructions (Signed)

## 2017-03-11 LAB — HEPATIC FUNCTION PANEL
AG Ratio: 1.4 (calc) (ref 1.0–2.5)
ALBUMIN MSPROF: 4.2 g/dL (ref 3.6–5.1)
ALT: 26 U/L (ref 6–29)
AST: 21 U/L (ref 10–35)
Alkaline phosphatase (APISO): 76 U/L (ref 33–130)
Bilirubin, Direct: 0.1 mg/dL (ref 0.0–0.2)
GLOBULIN: 3 g/dL (ref 1.9–3.7)
Indirect Bilirubin: 0.3 mg/dL (calc) (ref 0.2–1.2)
TOTAL PROTEIN: 7.2 g/dL (ref 6.1–8.1)
Total Bilirubin: 0.4 mg/dL (ref 0.2–1.2)

## 2017-03-11 LAB — URINALYSIS, ROUTINE W REFLEX MICROSCOPIC
Bilirubin Urine: NEGATIVE
Glucose, UA: NEGATIVE
Hgb urine dipstick: NEGATIVE
Leukocytes, UA: NEGATIVE
Nitrite: NEGATIVE
Protein, ur: NEGATIVE
SPECIFIC GRAVITY, URINE: 1.022 (ref 1.001–1.03)

## 2017-03-11 LAB — CBC WITH DIFFERENTIAL/PLATELET
BASOS PCT: 0.8 %
Basophils Absolute: 79 cells/uL (ref 0–200)
EOS ABS: 416 {cells}/uL (ref 15–500)
Eosinophils Relative: 4.2 %
HCT: 37.5 % (ref 35.0–45.0)
HEMOGLOBIN: 12.5 g/dL (ref 11.7–15.5)
Lymphs Abs: 3049 cells/uL (ref 850–3900)
MCH: 27.3 pg (ref 27.0–33.0)
MCHC: 33.3 g/dL (ref 32.0–36.0)
MCV: 81.9 fL (ref 80.0–100.0)
MPV: 10.7 fL (ref 7.5–12.5)
Monocytes Relative: 7.3 %
NEUTROS ABS: 5633 {cells}/uL (ref 1500–7800)
Neutrophils Relative %: 56.9 %
PLATELETS: 362 10*3/uL (ref 140–400)
RBC: 4.58 10*6/uL (ref 3.80–5.10)
RDW: 13.6 % (ref 11.0–15.0)
TOTAL LYMPHOCYTE: 30.8 %
WBC: 9.9 10*3/uL (ref 3.8–10.8)
WBCMIX: 723 {cells}/uL (ref 200–950)

## 2017-03-11 LAB — BASIC METABOLIC PANEL WITH GFR
BUN: 8 mg/dL (ref 7–25)
CALCIUM: 9.8 mg/dL (ref 8.6–10.4)
CHLORIDE: 100 mmol/L (ref 98–110)
CO2: 26 mmol/L (ref 20–32)
Creat: 0.72 mg/dL (ref 0.50–0.99)
GFR, EST AFRICAN AMERICAN: 105 mL/min/{1.73_m2} (ref >=60)
GFR, Est Non African American: 90 mL/min/{1.73_m2} (ref >=60)
GLUCOSE: 174 mg/dL — AB (ref 65–99)
Potassium: 5.1 mmol/L (ref 3.5–5.3)
Sodium: 141 mmol/L (ref 135–146)

## 2017-03-11 LAB — LIPID PANEL
CHOLESTEROL: 213 mg/dL — AB (ref <200)
HDL: 76 mg/dL (ref >50)
LDL CHOLESTEROL (CALC): 103 mg/dL — AB
Non-HDL Cholesterol (Calc): 137 mg/dL (calc) — ABNORMAL HIGH (ref <130)
TRIGLYCERIDES: 223 mg/dL — AB (ref <150)
Total CHOL/HDL Ratio: 2.8 (calc) (ref <5.0)

## 2017-03-11 LAB — MICROALBUMIN / CREATININE URINE RATIO
CREATININE, URINE: 129 mg/dL (ref 20–275)
MICROALB UR: 0.5 mg/dL
MICROALB/CREAT RATIO: 4 ug/mg{creat} (ref <30)

## 2017-03-11 LAB — MAGNESIUM: MAGNESIUM: 2.1 mg/dL (ref 1.5–2.5)

## 2017-03-11 LAB — HEMOGLOBIN A1C
Hgb A1c MFr Bld: 7.1 % of total Hgb — ABNORMAL HIGH (ref <5.7)
Mean Plasma Glucose: 157 (calc)
eAG (mmol/L): 8.7 (calc)

## 2017-03-11 LAB — TSH: TSH: 1.29 m[IU]/L (ref 0.40–4.50)

## 2017-03-11 LAB — VITAMIN D 25 HYDROXY (VIT D DEFICIENCY, FRACTURES): Vit D, 25-Hydroxy: 55 ng/mL (ref 30–100)

## 2017-03-17 ENCOUNTER — Other Ambulatory Visit: Payer: Self-pay | Admitting: Internal Medicine

## 2017-03-18 LAB — HM DIABETES EYE EXAM

## 2017-06-09 NOTE — Progress Notes (Signed)
FOLLOW UP  Assessment and Plan:   Hypertension Well controlled with current medications  Monitor blood pressure at home; patient to call if consistently greater than 130/80 Continue DASH diet.   Reminder to go to the ER if any CP, SOB, nausea, dizziness, severe HA, changes vision/speech, left arm numbness and tingling and jaw pain.  Cholesterol Currently above goal; discussed diet at length Continue low cholesterol diet and exercise.  Check lipid panel.   Diabetes with other diabetic neurologic complication Fairly controlled A1C; continue medications: glargine 12 units daily, saxagliptin-metformin 2.09-998 mg BID Discussed snacking on sweets - avoid snacks that don't truly satisfy - save up and splurge on treats that are "worth it" 1-2 times weekly, try to cut down on diet soda which may make sweets cravings worse Try to start exercising - discussed barriers and goal of 150 min weekly Perform daily foot/skin check, notify office of any concerning changes.  Check A1C  GERD Well managed on current medications Discussed diet, avoiding triggers and other lifestyle changes  Vitamin D Def At goal at last visit; continue supplementation to maintain goal of 70-100 Defer Vit D level  Continue diet and meds as discussed. Further disposition pending results of labs. Discussed med's effects and SE's.   Over 30 minutes of exam, counseling, chart review, and critical decision making was performed.   Future Appointments  Date Time Provider [place]  09/08/2017  3:30 PM Unk Pinto, MD [facility] None  04/07/2018  2:00 PM Unk Pinto, MD [facility] None    ----------------------------------------------------------------------------------------------------------------------  HPI 62 y.o. female  presents for 3 month follow up on hypertension, cholesterol, diabetes, weight, GERD without esophagitis (EGD 2017) and vitamin D deficiency.   She reports a recent episode of  dizziness x 3 days - started 1/15 - awoke to extreme dizziness that she noted with ambulation or with sudden movements of her head. She denies HA, nausea, weakness of extremities, tingling/numbness, changes in vision (blurriness or diplopia). She does endorse URI 2 weeks ago. She reports this is fully resolved without further issues or episodes. Discussed likely BPV - patient to notify office of any further episodes or vertigo with other symptoms present to ED.   BMI is Body mass index is 25.58 kg/m., she has been working on diet but has not been exercising recently. Discussed cardiovascular exercise recommendations - she plans to start a regimen in the spring. Discussed barriers to exercising and strategies.  Wt Readings from Last 3 Encounters:  06/10/17 149 lb (67.6 kg)  03/10/17 151 lb 6.4 oz (68.7 kg)  02/17/17 148 lb 6.4 oz (67.3 kg)   she has a diagnosis of GERD which is currently managed by prilosec 40 mg daily. She has been on this dose for an extended period.  she reports symptoms is currently well controlled, and denies breakthrough reflux, burning in chest, hoarseness or cough.    Her blood pressure has been controlled at home, today their BP is BP: 106/64  She does not workout. She denies chest pain, shortness of breath, dizziness.   She is on cholesterol medication (pravastatin 40 mg daily) and denies myalgias. Her cholesterol is not at goal. The cholesterol last visit was:   Lab Results  Component Value Date   CHOL 213 (H) 03/10/2017   HDL 76 03/10/2017   LDLCALC 86 11/12/2016   TRIG 223 (H) 03/10/2017   CHOLHDL 2.8 03/10/2017    She has been working on diet and exercise for T2 diabetes, currently treated by  and  denies increased appetite, nausea, paresthesia of the feet, polydipsia, polyuria, visual disturbances, vomiting and weight loss. She checks her sugars rarely if she "feels bad" - 1-2 times a month, typically runs high at that time - will be 160-170 - no recent episodes  of hypoglycemia recently. Last A1C in the office was:  Lab Results  Component Value Date   HGBA1C 7.1 (H) 03/10/2017   Patient is on Vitamin D supplement but remained below goal of 70 at recent check:   Lab Results  Component Value Date   VD25OH 55 03/10/2017    She did increase her dose after the last check.   Current Medications:  Current Outpatient Medications on File Prior to Visit  Medication Sig  . acetaminophen (TYLENOL) 500 MG tablet Take 500-1,000 mg by mouth every 4 (four) hours as needed for mild pain, moderate pain, fever or headache. For pain   . B-D ULTRAFINE III SHORT PEN 31G X 8 MM MISC USE AS DIRECTED  WITH  LANTUS  INJECTIONS  . buPROPion (WELLBUTRIN XL) 300 MG 24 hr tablet Take 300 mg by mouth daily.   . Cholecalciferol (VITAMIN D) 2000 units tablet Take 2,000 Units by mouth daily with breakfast.  . diclofenac sodium (VOLTAREN) 1 % GEL Apply 2 g topically 4 (four) times daily. (Patient taking differently: Apply 2 g topically 4 (four) times daily as needed (for pain). )  . hydrocortisone (PROCTO-PAK) 1 % CREA Apply once or twice daily as needed for hemorrhoids  . KOMBIGLYZE XR 2.09-998 MG TB24 TAKE 1 TABLET TWICE DAILY WITH A MEAL  . LANTUS SOLOSTAR 100 UNIT/ML Solostar Pen INJECT  8 UNITS SUBCUTANEOUSLY EVERY DAY AS DIRECTED OR AS DIRECTED BY PRIMARY CARE PHYSICIAN DISCARD PEN 28 DAYS AFTER OPENING  . losartan (COZAAR) 100 MG tablet TAKE 1/2 TO 1 TABLET DAILY AS DIRECTED FOR BLOOD PRESSURE AND KIDNEY PROTECTION  . meloxicam (MOBIC) 7.5 MG tablet Take 1 tablet (7.5 mg total) by mouth 2 (two) times daily as needed for pain.  Marland Kitchen omeprazole (PRILOSEC) 40 MG capsule TAKE 1 CAPSULE EVERY DAY  . pravastatin (PRAVACHOL) 40 MG tablet TAKE 1 TABLET EVERY DAY  . promethazine-dextromethorphan (PROMETHAZINE-DM) 6.25-15 MG/5ML syrup Take 1 to 2 tsp enery 4 hours if needed for cough  . triamcinolone ointment (KENALOG) 0.1 % Apply 1 application topically 2 (two) times daily.  Marland Kitchen  venlafaxine XR (EFFEXOR-XR) 75 MG 24 hr capsule Take 75 mg by mouth daily with breakfast.   . zolpidem (AMBIEN) 10 MG tablet Take 10 mg by mouth at bedtime.    No current facility-administered medications on file prior to visit.      Allergies:  Allergies  Allergen Reactions  . Codeine Palpitations and Other (See Comments)    Makes her head feel "weird"  . Glucophage [Metformin Hcl] Diarrhea  . Nsaids Other (See Comments)    "upset stomach"     Medical History:  Past Medical History:  Diagnosis Date  . Anxiety   . Chest pain 04/2011   saw Dr Ron Parker..not heart related  sent to GI dr. and found gallstones.  . Complication of anesthesia 2001; 20012   "slow to wake up"  . Depression   . GERD (gastroesophageal reflux disease)   . H/O hiatal hernia   . Hypercholesteremia   . Hypertension   . Mental disorder   . Renal disorder   . Type II diabetes mellitus ([place]) 2003   Family history- Reviewed and unchanged Social history- Reviewed and unchanged  Review of Systems:  Review of Systems  Constitutional: Negative for malaise/fatigue and weight loss.  HENT: Negative for hearing loss and tinnitus.   Eyes: Negative for blurred vision and double vision.  Respiratory: Negative for cough, shortness of breath and wheezing.   Cardiovascular: Negative for chest pain, palpitations, orthopnea, claudication and leg swelling.  Gastrointestinal: Negative for abdominal pain, blood in stool, constipation, diarrhea, heartburn, melena, nausea and vomiting.  Genitourinary: Negative.   Musculoskeletal: Negative for joint pain and myalgias.  Skin: Negative for rash.  Neurological: Negative for dizziness, tingling, sensory change, weakness and headaches.  Endo/Heme/Allergies: Negative for polydipsia.  Psychiatric/Behavioral: Negative.   All other systems reviewed and are negative.     Physical Exam: BP 106/64   Pulse 82   Temp (!) 97.3 F (36.3 C)   Ht 5\' 4"  (1.626 m)   Wt 149 lb (67.6  kg)   SpO2 97%   BMI 25.58 kg/m  Wt Readings from Last 3 Encounters:  06/10/17 149 lb (67.6 kg)  03/10/17 151 lb 6.4 oz (68.7 kg)  02/17/17 148 lb 6.4 oz (67.3 kg)   General Appearance: Well nourished, in no apparent distress. Eyes: PERRLA, EOMs, conjunctiva no swelling or erythema Sinuses: No Frontal/maxillary tenderness ENT/Mouth: Ext aud canals clear, TMs without erythema, bulging. No erythema, swelling, or exudate on post pharynx.  Tonsils not swollen or erythematous. Hearing normal.  Neck: Supple, thyroid normal.  Respiratory: Respiratory effort normal, BS equal bilaterally without rales, rhonchi, wheezing or stridor.  Cardio: RRR with no MRGs. Brisk peripheral pulses without edema.  Abdomen: Soft, + BS.  Non tender, no guarding, rebound, hernias, masses. Lymphatics: Non tender without lymphadenopathy.  Musculoskeletal: Full ROM, 5/5 strength, Normal gait Skin: Warm, dry without rashes, lesions, ecchymosis.  Neuro: Cranial nerves intact. No cerebellar symptoms.  Psych: Awake and oriented X 3, normal affect, Insight and Judgment appropriate.    Mindy Ribas, NP 3:43 PM [facility]

## 2017-06-10 ENCOUNTER — Encounter: Payer: Self-pay | Admitting: Adult Health

## 2017-06-10 ENCOUNTER — Ambulatory Visit (INDEPENDENT_AMBULATORY_CARE_PROVIDER_SITE_OTHER): Payer: Medicare HMO | Admitting: Adult Health

## 2017-06-10 VITALS — BP 106/64 | HR 82 | Temp 97.3°F | Ht 64.0 in | Wt 149.0 lb

## 2017-06-10 DIAGNOSIS — E119 Type 2 diabetes mellitus without complications: Secondary | ICD-10-CM | POA: Diagnosis not present

## 2017-06-10 DIAGNOSIS — E782 Mixed hyperlipidemia: Secondary | ICD-10-CM

## 2017-06-10 DIAGNOSIS — K2100 Gastro-esophageal reflux disease with esophagitis, without bleeding: Secondary | ICD-10-CM

## 2017-06-10 DIAGNOSIS — Z79899 Other long term (current) drug therapy: Secondary | ICD-10-CM | POA: Diagnosis not present

## 2017-06-10 DIAGNOSIS — E559 Vitamin D deficiency, unspecified: Secondary | ICD-10-CM | POA: Diagnosis not present

## 2017-06-10 DIAGNOSIS — Z794 Long term (current) use of insulin: Secondary | ICD-10-CM | POA: Diagnosis not present

## 2017-06-10 DIAGNOSIS — I10 Essential (primary) hypertension: Secondary | ICD-10-CM

## 2017-06-10 NOTE — Patient Instructions (Addendum)
Consider increasing water - work up to 80+ ounces daily - I like to try to start early in the day with a large mug of hot water with lemon, then aim to get in 40 ounces over the morning and 40 ounces in the afternoon.  Do your best to move everyday! This can help significantly with insulin resistance (and lower your blood sugar) - aim for 150 min a week  Instead of eating sweet things that don't really satisfy you, consider identifying 1-2 "treats" that you REALLY miss and look forward to and have those instead. Take an extra walk afterwards.   [employer] with no obligation # [phone] Do not have to be a member Tues-Sat 10-6  [facility]- free test with no obligation # [phone] MUST BE A MEMBER Call for store hours  Have had patient's get good cheaper hearing aids from [employer] The air version has good reviews.      GETTING OFF OF PPI's    Nexium/protonix/prilosec/Omeprazole/Dexilant/Aciphex are called PPI's, they are great at healing your stomach but should only be taken for a short period of time.     Recent studies have shown that taken for a long time they  can increase the risk of osteoporosis (weakening of your bones), pneumonia, low magnesium, restless legs, Cdiff (infection that causes diarrhea), DEMENTIA and most recently kidney damage / disease / insufficiency.     Due to this information we want to try to stop the PPI but if you try to stop it abruptly this can cause rebound acid and worsening symptoms.   So this is how we want you to get off the PPI: Generic is always fine!!  - Start taking the nexium/protonix/prilosec/PPI  every other day with  zantac (ranitidine) OR pepcid famotadine 2 x a day for 2-4 weeks - some people stay on this dosage and can not taper off further. Our main goal is to limit the dosage and amount you are taking so if you need to stay on this dose.   - then decrease the PPI to every 3 days while taking the  zantac or pepcid 300mg  twice a day the other  days for 2-4  Weeks  - then you can try the zantac or pepcid 300mg  once at night or up to 2 x day as needed.  - you can continue on this once at night or stop all together  - Avoid alcohol, spicy foods, NSAIDS (aleve, ibuprofen) at this time. See foods below.   +++++++++++++++++++++++++++++++++++++++++++  Food Choices for Gastroesophageal Reflux Disease  When you have gastroesophageal reflux disease (GERD), the foods you eat and your eating habits are very important. Choosing the right foods can help ease the discomfort of GERD. WHAT GENERAL GUIDELINES DO I NEED TO FOLLOW?  Choose fruits, vegetables, whole grains, low-fat dairy products, and low-fat meat, fish, and poultry.  Limit fats such as oils, salad dressings, butter, nuts, and avocado.  Keep a food diary to identify foods that cause symptoms.  Avoid foods that cause reflux. These may be different for different people.  Eat frequent small meals instead of three large meals each day.  Eat your meals slowly, in a relaxed setting.  Limit fried foods.  Cook foods using methods other than frying.  Avoid drinking alcohol.  Avoid drinking large amounts of liquids with your meals.  Avoid bending over or lying down until 2-3 hours after eating.   WHAT FOODS ARE NOT RECOMMENDED? The following  are some foods and drinks that may worsen your symptoms:  Vegetables Tomatoes. Tomato juice. Tomato and spaghetti sauce. Chili peppers. Onion and garlic. Horseradish. Fruits Oranges, grapefruit, and lemon (fruit and juice). Meats High-fat meats, fish, and poultry. This includes hot dogs, ribs, ham, sausage, salami, and bacon. Dairy Whole milk and chocolate milk. Sour cream. Cream. Butter. Ice cream. Cream cheese.  Beverages Coffee and tea, with or without caffeine. Carbonated beverages or energy drinks. Condiments Hot sauce. Barbecue sauce.  Sweets/Desserts Chocolate and cocoa.  Donuts. Peppermint and spearmint. Fats and Oils High-fat foods, including [place] fries and potato chips. Other Vinegar. Strong spices, such as black pepper, white pepper, red pepper, cayenne, curry powder, cloves, ginger, and chili powder.

## 2017-06-11 ENCOUNTER — Other Ambulatory Visit: Payer: Self-pay | Admitting: Adult Health

## 2017-06-11 DIAGNOSIS — E782 Mixed hyperlipidemia: Secondary | ICD-10-CM

## 2017-06-11 LAB — HEMOGLOBIN A1C
Hgb A1c MFr Bld: 7.5 % of total Hgb — ABNORMAL HIGH (ref <5.7)
Mean Plasma Glucose: 169 (calc)
eAG (mmol/L): 9.3 (calc)

## 2017-06-11 LAB — HEPATIC FUNCTION PANEL
AG Ratio: 1.3 (calc) (ref 1.0–2.5)
ALBUMIN MSPROF: 4.2 g/dL (ref 3.6–5.1)
ALT: 28 U/L (ref 6–29)
AST: 27 U/L (ref 10–35)
Alkaline phosphatase (APISO): 80 U/L (ref 33–130)
Bilirubin, Direct: 0.1 mg/dL (ref 0.0–0.2)
Globulin: 3.2 g/dL (calc) (ref 1.9–3.7)
Indirect Bilirubin: 0.3 mg/dL (calc) (ref 0.2–1.2)
Total Bilirubin: 0.4 mg/dL (ref 0.2–1.2)
Total Protein: 7.4 g/dL (ref 6.1–8.1)

## 2017-06-11 LAB — CBC WITH DIFFERENTIAL/PLATELET
Basophils Absolute: 92 cells/uL (ref 0–200)
Basophils Relative: 1 %
EOS PCT: 3.6 %
Eosinophils Absolute: 331 cells/uL (ref 15–500)
HEMATOCRIT: 38.4 % (ref 35.0–45.0)
Hemoglobin: 12.7 g/dL (ref 11.7–15.5)
Lymphs Abs: 2576 cells/uL (ref 850–3900)
MCH: 26.6 pg — ABNORMAL LOW (ref 27.0–33.0)
MCHC: 33.1 g/dL (ref 32.0–36.0)
MCV: 80.5 fL (ref 80.0–100.0)
MPV: 10.6 fL (ref 7.5–12.5)
Monocytes Relative: 6.7 %
NEUTROS PCT: 60.7 %
Neutro Abs: 5584 cells/uL (ref 1500–7800)
PLATELETS: 413 10*3/uL — AB (ref 140–400)
RBC: 4.77 10*6/uL (ref 3.80–5.10)
RDW: 13.5 % (ref 11.0–15.0)
Total Lymphocyte: 28 %
WBC mixed population: 616 cells/uL (ref 200–950)
WBC: 9.2 10*3/uL (ref 3.8–10.8)

## 2017-06-11 LAB — LIPID PANEL
CHOLESTEROL: 207 mg/dL — AB (ref <200)
HDL: 67 mg/dL (ref >50)
LDL Cholesterol (Calc): 104 mg/dL (calc) — ABNORMAL HIGH
Non-HDL Cholesterol (Calc): 140 mg/dL (calc) — ABNORMAL HIGH (ref <130)
TRIGLYCERIDES: 238 mg/dL — AB (ref <150)
Total CHOL/HDL Ratio: 3.1 (calc) (ref <5.0)

## 2017-06-11 LAB — BASIC METABOLIC PANEL WITH GFR
BUN: 9 mg/dL (ref 7–25)
CALCIUM: 9.7 mg/dL (ref 8.6–10.4)
CHLORIDE: 101 mmol/L (ref 98–110)
CO2: 28 mmol/L (ref 20–32)
Creat: 0.67 mg/dL (ref 0.50–0.99)
GFR, EST NON AFRICAN AMERICAN: 95 mL/min/{1.73_m2} (ref >=60)
GFR, Est African American: 110 mL/min/{1.73_m2} (ref >=60)
Glucose, Bld: 187 mg/dL — ABNORMAL HIGH (ref 65–99)
POTASSIUM: 4.2 mmol/L (ref 3.5–5.3)
Sodium: 139 mmol/L (ref 135–146)

## 2017-06-11 LAB — TSH: TSH: 1.66 mIU/L (ref 0.40–4.50)

## 2017-06-11 LAB — VITAMIN D 25 HYDROXY (VIT D DEFICIENCY, FRACTURES): Vit D, 25-Hydroxy: 61 ng/mL (ref 30–100)

## 2017-06-11 MED ORDER — ATORVASTATIN CALCIUM 40 MG PO TABS: 40.0000 mg | ORAL_TABLET | Freq: Every day | ORAL | 2 refills | Status: DC

## 2017-06-26 ENCOUNTER — Other Ambulatory Visit: Payer: Self-pay | Admitting: Internal Medicine

## 2017-09-06 NOTE — Progress Notes (Signed)
MEDICARE ANNUAL WELLNESS VISIT AND FOLLOW UP  Assessment:   Essential hypertension - continue medications, DASH diet, exercise and monitor at home. Call if greater than 130/80.  - CBC with Differential/Platelet - BASIC METABOLIC PANEL WITH GFR - Hepatic function panel - TSH  Insulin-requiring or dependent type II diabetes mellitus ([place]) Discussed general issues about diabetes pathophysiology and management., Educational material distributed., Suggested low cholesterol diet., Encouraged aerobic exercise., Discussed foot care., Reminded to get yearly retinal exam. - Hemoglobin A1c   Diabetic polyneuropathy associated with type 2 diabetes mellitus ([place]) Discussed general issues about diabetes pathophysiology and management., Educational material distributed., Suggested low cholesterol diet., Encouraged aerobic exercise., Discussed foot care., Reminded to get yearly retinal exam.  Hyperlipidemia - on pravastatin - Lipid panel   Depression, major, in remission (HCC) Follows Dr. Toy Care, remission  Medicare annual wellness visit, initial Next year  GERD Diet discussed   Vitamin D deficiency - VITAMIN D 25 Hydroxy (Vit-D Deficiency, Fractures)  Medication management - Magnesium  OSA (obstructive sleep apnea) Sleep apnea- continue CPAP, weight loss advised.     Overweight  - long discussion about weight loss, diet, and exercise  Encounter for long-term (current) use of insulin (HCC) Continue insulin, check A1C, diet discussed  Foot pain, bilateral -     Ambulatory referral to Podiatry -     gabapentin (NEURONTIN) 100 MG capsule; Take 1 capsule (100 mg total) by mouth 3 (three) times daily. -     Uric acid  High arches -     Ambulatory referral to Podiatry   Future Appointments  Date Time Provider Seth Ward  04/07/2018  2:00 PM Unk Pinto, MD [facility] None     Plan:   During the course of the visit the patient was educated and counseled about  appropriate screening and preventive services including:    Pneumococcal vaccine   Influenza vaccine  Td vaccine  Screening electrocardiogram  Screening mammography  Bone densitometry screening  Colorectal cancer screening  Diabetes screening  Glaucoma screening  Nutrition counseling   Advanced directives: given info/requested  Subjective:   Mindy Richard is a 62 y.o. female who presents for Medicare Annual Wellness Visit and follow up.   She is having trouble bilateral feet, she has high arches, states she has had feet pain all her life but these are worse. She has numbness at night in her toes only, no numbness/tingling at night. She states her balance is not good, she will have "slap foot" she has some ingrown toenails. She has tried amitriptyline and lyrica in the past but did not do well with it.  She has right flank/back pain, had kidney stone 06/2016, states feels different, worse in afternoon/evening with lying down, better in the AM. Has history of Mild hepatic steatosis and Cholecystectomy.   Her blood pressure has been controlled at home, today it is: BP: 122/76  She does not workout. She denies chest pain, shortness of breath, dizziness.  She is on cholesterol medication, switched to lipitor from pravastatin and denies myalgias. Her cholesterol is at goal. The cholesterol last visit was:   Lab Results  Component Value Date   CHOL 207 (H) 06/10/2017   HDL 67 06/10/2017   LDLCALC 104 (H) 06/10/2017   TRIG 238 (H) 06/10/2017   CHOLHDL 3.1 06/10/2017   She has been working on diet and exercise for Diabetes, she checks her sugars rarely,  she is on 12 units of the lantus and saxagliptin 2.5/1000mg  and denies polydipsia, polyuria  and visual disturbances. No hypoglycemia. She has bilateral toe numbness, no ulcers. She is not on ACE/ARB.  Last A1C in the office was:  Lab Results  Component Value Date   HGBA1C 7.5 (H) 06/10/2017    Lab Results  Component Value  Date   GFRNONAA 95 06/10/2017   Patient is on Vitamin D supplement. Lab Results  Component Value Date   VD25OH 61 06/10/2017   Has OSA, not on CPAP, could not tolerate the mask. .  She is on wellbutrin and effexor for anxiety/depression.  BMI is Body mass index is 25.75 kg/m., she is working on diet and exercise. Wt Readings from Last 3 Encounters:  09/08/17 150 lb (68 kg)  06/10/17 149 lb (67.6 kg)  03/10/17 151 lb 6.4 oz (68.7 kg)     Names of Other Physician/Practitioners you currently use: 1. [facility]- here for primary care 2. Eye doctor Dr. Agapito Games- 12/2016 3. Dentist Dr. Vanessa Kick Patient Care Team: Unk Pinto, MD as PCP - General (Internal Medicine) Teena Irani, MD (Inactive) as Consulting Physician (Gastroenterology) Neldon Mc, MD as Consulting Physician (General Surgery) [place] as Respiratory Therapist [facility]) Alden Hipp, MD as Consulting Physician (Obstetrics and Gynecology)  Medication Review  Current Outpatient Medications (Endocrine & Metabolic):  Marland Kitchen  KOMBIGLYZE XR 2.09-998 MG TB24, TAKE 1 TABLET TWICE DAILY WITH A MEAL .  LANTUS SOLOSTAR 100 UNIT/ML Solostar Pen, INJECT  8 UNITS SUBCUTANEOUSLY EVERY DAY AS DIRECTED OR AS DIRECTED BY PRIMARY CARE PHYSICIAN DISCARD PEN 28 DAYS AFTER OPENING  Current Outpatient Medications (Cardiovascular):  .  atorvastatin (LIPITOR) 40 MG tablet, Take 1 tablet (40 mg total) by mouth daily. Marland Kitchen  losartan (COZAAR) 100 MG tablet, TAKE 1/2 TO 1 TABLET DAILY AS DIRECTED FOR BLOOD PRESSURE AND KIDNEY PROTECTION   Current Outpatient Medications (Analgesics):  .  acetaminophen (TYLENOL) 500 MG tablet, Take 500-1,000 mg by mouth every 4 (four) hours as needed for mild pain, moderate pain, fever or headache. For pain  .  meloxicam (MOBIC) 7.5 MG tablet, Take 1 tablet (7.5 mg total) by mouth 2 (two) times daily as needed for  pain.   Current Outpatient Medications (Other):  Marland Kitchen  B-D ULTRAFINE III SHORT PEN 31G X 8 MM MISC, USE AS DIRECTED  WITH  LANTUS  INJECTIONS .  buPROPion (WELLBUTRIN XL) 300 MG 24 hr tablet, Take 300 mg by mouth daily.  .  Cholecalciferol (VITAMIN D) 2000 units tablet, Take 2,000 Units by mouth daily with breakfast. .  diclofenac sodium (VOLTAREN) 1 % GEL, Apply 2 g topically 4 (four) times daily. (Patient taking differently: Apply 2 g topically 4 (four) times daily as needed (for pain). ) .  hydrocortisone (PROCTO-PAK) 1 % CREA, Apply once or twice daily as needed for hemorrhoids .  omeprazole (PRILOSEC) 40 MG capsule, TAKE 1 CAPSULE EVERY DAY .  triamcinolone ointment (KENALOG) 0.1 %, Apply 1 application topically 2 (two) times daily. Marland Kitchen  venlafaxine XR (EFFEXOR-XR) 75 MG 24 hr capsule, Take 75 mg by mouth daily with breakfast.  .  zolpidem (AMBIEN) 10 MG tablet, Take 10 mg by mouth at bedtime.  .  gabapentin (NEURONTIN) 100 MG capsule, Take 1 capsule (100 mg total) by mouth 3 (three) times daily.  Current Problems (verified) Patient Active Problem List   Diagnosis Date Noted  . Encounter for long-term (current) use of insulin ([place]) 08/07/2016  . OSA (obstructive sleep apnea) 06/04/2015  . Neuropathy, diabetic ([place]) 04/16/2015  .  Medicare annual wellness visit, initial 01/11/2015  . Insulin-requiring or dependent type II diabetes mellitus ([place]) 08/10/2013  . Vitamin D deficiency 04/03/2013  . Medication management 04/03/2013  . Hyperlipidemia 04/03/2013  . Hypertension 05/09/2011  . Depression, major, in remission ([place]) 05/09/2011  . GERD 05/09/2011    Screening Tests Immunization History  Administered Date(s) Administered  . DT 08/10/2013  . Influenza Inj Mdck Quad With Preservative 02/17/2017  . Influenza Split 04/04/2013, 04/03/2014, 03/08/2015  . Influenza,inj,quad, With Preservative 03/25/2016  . Pneumococcal-Unspecified 01/17/2006    Preventative care: Last colonoscopy:  2012 due 2022 EGD 09/2015 ERCP 2013 Ct renal 07/2016 Last mammogram: 02/2016 DUE Last pap smear/pelvic exam: 02/2016 DEXA: Due age 27 Myoview stress test 01/2012   Prior vaccinations: TD or Tdap: 2015  Influenza: 2018 Pneumococcal: 2007 Prevnar 13: DUE age 55 Shingles/Zostavax: declines  Allergies Allergies  Allergen Reactions  . Codeine Palpitations and Other (See Comments)    Makes her head feel "weird"  . Glucophage [Metformin Hcl] Diarrhea  . Nsaids Other (See Comments)    "upset stomach"    SURGICAL HISTORY She  has a past surgical history that includes Shoulder arthroscopy w/ rotator cuff repair (2012); Cesarean section (1985); Cholecystectomy (09/02/11); Carpal tunnel release (2001); Tonsillectomy (~ 1970); Dilation and curettage of uterus (1984); Tubal ligation (1985?); Colonoscopy; Cholecystectomy (09/02/2011); ERCP (09/03/2011); and Carpal tunnel release (Left, 2017). FAMILY HISTORY Her family history includes Cancer in her maternal aunt; Diabetes in her mother; Heart attack (age of onset: 26) in her father; Hyperlipidemia in her mother. SOCIAL HISTORY She  reports that she has never smoked. She has never used smokeless tobacco. She reports that she does not drink alcohol or use drugs.  MEDICARE WELLNESS OBJECTIVES: Physical activity: Current Exercise Habits: Home exercise routine, Type of exercise: walking, Time (Minutes): 30, Frequency (Times/Week): 7, Weekly Exercise (Minutes/Week): 210, Intensity: Mild Cardiac risk factors: Cardiac Risk Factors include: advanced age (>65men, >32 women);diabetes mellitus;dyslipidemia;hypertension;sedentary lifestyle Depression/mood screen:   Depression screen [facility] 2/9 09/08/2017  Decreased Interest 0  Down, Depressed, Hopeless 0  PHQ - 2 Score 0    ADLs:  In your present state of health, do you have any difficulty performing the following activities: 09/08/2017 03/10/2017  Hearing? N N  Vision? N N  Difficulty concentrating or  making decisions? Y N  Walking or climbing stairs? N N  Dressing or bathing? N N  Doing errands, shopping? N N  Some recent data might be hidden    Cognitive Testing  Alert? Yes  Normal Appearance?Yes  Oriented to person? Yes  Place? Yes   Time? Yes  Recall of three objects?  Yes  Can perform simple calculations? Yes  Displays appropriate judgment?Yes  Can read the correct time from a watch face?Yes  EOL planning: Does Patient Have a Medical Advance Directive?: Yes Type of Advance Directive: Healthcare Power of Attorney, Living will [place] in Chart?: No - copy requested   Objective:   Blood pressure 122/76, pulse 90, temperature (!) 97.3 F (36.3 C), resp. rate 16, height 5\' 4"  (1.626 m), weight 150 lb (68 kg), SpO2 96 %. Body mass index is 25.75 kg/m.  General appearance: alert, no distress, WD/WN,  female HEENT: normocephalic, sclerae anicteric, TMs pearly, nares patent, no discharge or erythema, pharynx normal Oral cavity: MMM, no lesions Neck: supple, no lymphadenopathy, no thyromegaly, no masses Heart: RRR, normal S1, S2, no murmurs Lungs: CTA bilaterally, no wheezes, rhonchi, or rales Abdomen: +bs, soft, mild epigastric tenderness,  non distended, no masses, no hepatomegaly, no splenomegaly Musculoskeletal: nontender, no swelling, no obvious deformity Extremities: no edema, no cyanosis, no clubbing Pulses: 2+ symmetric, upper and lower extremities, normal cap refill Neurological: alert, oriented x 3, CN2-12 intact, strength normal upper extremities and lower extremities, sensation normal bilateral feet, DTRs 2+ throughout, no cerebellar signs, gait normal Psychiatric: normal affect, behavior normal, pleasant  Breast: defer Gyn: defer Rectal: defer  Medicare Attestation I have personally reviewed: The patient's medical and social history Their use of alcohol, tobacco or illicit drugs Their current medications and supplements The  patient's functional ability including ADLs,fall risks, home safety risks, cognitive, and hearing and visual impairment Diet and physical activities Evidence for depression or mood disorders  The patient's weight, height, BMI, and visual acuity have been recorded in the chart.  I have made referrals, counseling, and provided education to the patient based on review of the above and I have provided the patient with a written personalized care plan for preventive services.     Vicie Mutters, PA-C   09/08/2017

## 2017-09-08 ENCOUNTER — Ambulatory Visit: Payer: Self-pay | Admitting: Internal Medicine

## 2017-09-08 ENCOUNTER — Other Ambulatory Visit: Payer: Self-pay

## 2017-09-08 ENCOUNTER — Encounter: Payer: Self-pay | Admitting: Physician Assistant

## 2017-09-08 ENCOUNTER — Ambulatory Visit (INDEPENDENT_AMBULATORY_CARE_PROVIDER_SITE_OTHER): Payer: Medicare HMO | Admitting: Physician Assistant

## 2017-09-08 VITALS — BP 122/76 | HR 90 | Temp 97.3°F | Resp 16 | Ht 64.0 in | Wt 150.0 lb

## 2017-09-08 DIAGNOSIS — E782 Mixed hyperlipidemia: Secondary | ICD-10-CM

## 2017-09-08 DIAGNOSIS — Z0001 Encounter for general adult medical examination with abnormal findings: Secondary | ICD-10-CM

## 2017-09-08 DIAGNOSIS — Q667 Congenital pes cavus, unspecified foot: Secondary | ICD-10-CM

## 2017-09-08 DIAGNOSIS — K2100 Gastro-esophageal reflux disease with esophagitis, without bleeding: Secondary | ICD-10-CM

## 2017-09-08 DIAGNOSIS — M79672 Pain in left foot: Secondary | ICD-10-CM

## 2017-09-08 DIAGNOSIS — E119 Type 2 diabetes mellitus without complications: Secondary | ICD-10-CM | POA: Diagnosis not present

## 2017-09-08 DIAGNOSIS — Z0000 Encounter for general adult medical examination without abnormal findings: Secondary | ICD-10-CM

## 2017-09-08 DIAGNOSIS — Z6825 Body mass index (BMI) 25.0-25.9, adult: Secondary | ICD-10-CM | POA: Diagnosis not present

## 2017-09-08 DIAGNOSIS — M79671 Pain in right foot: Secondary | ICD-10-CM

## 2017-09-08 DIAGNOSIS — I10 Essential (primary) hypertension: Secondary | ICD-10-CM

## 2017-09-08 DIAGNOSIS — R6889 Other general symptoms and signs: Secondary | ICD-10-CM | POA: Diagnosis not present

## 2017-09-08 DIAGNOSIS — E559 Vitamin D deficiency, unspecified: Secondary | ICD-10-CM

## 2017-09-08 DIAGNOSIS — E1142 Type 2 diabetes mellitus with diabetic polyneuropathy: Secondary | ICD-10-CM | POA: Diagnosis not present

## 2017-09-08 DIAGNOSIS — G4733 Obstructive sleep apnea (adult) (pediatric): Secondary | ICD-10-CM | POA: Diagnosis not present

## 2017-09-08 DIAGNOSIS — Z794 Long term (current) use of insulin: Secondary | ICD-10-CM | POA: Diagnosis not present

## 2017-09-08 DIAGNOSIS — Z79899 Other long term (current) drug therapy: Secondary | ICD-10-CM

## 2017-09-08 DIAGNOSIS — F325 Major depressive disorder, single episode, in full remission: Secondary | ICD-10-CM

## 2017-09-08 MED ORDER — ATORVASTATIN CALCIUM 40 MG PO TABS: 40.0000 mg | ORAL_TABLET | Freq: Every day | ORAL | 2 refills | Status: AC

## 2017-09-08 MED ORDER — GABAPENTIN 100 MG PO CAPS: 100.0000 mg | ORAL_CAPSULE | Freq: Three times a day (TID) | ORAL | 2 refills | Status: DC

## 2017-09-08 NOTE — Patient Instructions (Signed)
    When it comes to diets, agreement about the perfect plan isn't easy to find, even among the experts. Experts at the [employer] developed an idea known as the Healthy Eating Plate. Just imagine a plate divided into logical, healthy portions.  The emphasis is on diet quality:  Load up on vegetables and fruits - one-half of your plate: Aim for color and variety, and remember that potatoes don't count.  Go for whole grains - one-quarter of your plate: Whole wheat, barley, wheat berries, quinoa, oats, brown rice, and foods made with them. If you want pasta, go with whole wheat pasta.  Protein power - one-quarter of your plate: Fish, chicken, beans, and nuts are all healthy, versatile protein sources. Limit red meat.  The diet, however, does go beyond the plate, offering a few other suggestions.  Use healthy plant oils, such as olive, canola, soy, corn, sunflower and peanut. Check the labels, and avoid partially hydrogenated oil, which have unhealthy trans fats.  If you're thirsty, drink water. Coffee and tea are good in moderation, but skip sugary drinks and limit milk and dairy products to one or two daily servings.  The type of carbohydrate in the diet is more important than the amount. Some sources of carbohydrates, such as vegetables, fruits, whole grains, and beans-are healthier than others.  Finally, stay active.  Check out  Mini habits for weight loss book  2 apps for tracking food is myfitness pal  loseit OR can take picture of your food  

## 2017-09-09 MED ORDER — GLUCOSE BLOOD VI STRP: ORAL_STRIP | 12 refills | Status: AC

## 2017-09-11 ENCOUNTER — Other Ambulatory Visit: Payer: Self-pay

## 2017-09-11 MED ORDER — BLOOD GLUCOSE METER KIT: PACK | 0 refills | Status: AC

## 2017-09-14 LAB — CBC WITH DIFFERENTIAL/PLATELET
BASOS ABS: 81 {cells}/uL (ref 0–200)
Basophils Relative: 0.9 %
Eosinophils Absolute: 342 cells/uL (ref 15–500)
Eosinophils Relative: 3.8 %
HCT: 35.8 % (ref 35.0–45.0)
Hemoglobin: 12.2 g/dL (ref 11.7–15.5)
LYMPHS ABS: 2484 {cells}/uL (ref 850–3900)
MCH: 27.6 pg (ref 27.0–33.0)
MCHC: 34.1 g/dL (ref 32.0–36.0)
MCV: 81 fL (ref 80.0–100.0)
MPV: 10.4 fL (ref 7.5–12.5)
Monocytes Relative: 7.7 %
NEUTROS PCT: 60 %
Neutro Abs: 5400 cells/uL (ref 1500–7800)
PLATELETS: 405 10*3/uL — AB (ref 140–400)
RBC: 4.42 10*6/uL (ref 3.80–5.10)
RDW: 13.3 % (ref 11.0–15.0)
TOTAL LYMPHOCYTE: 27.6 %
WBC: 9 10*3/uL (ref 3.8–10.8)
WBCMIX: 693 {cells}/uL (ref 200–950)

## 2017-09-14 LAB — COMPLETE METABOLIC PANEL WITH GFR
AG RATIO: 1.5 (calc) (ref 1.0–2.5)
ALKALINE PHOSPHATASE (APISO): 82 U/L (ref 33–130)
ALT: 20 U/L (ref 6–29)
AST: 23 U/L (ref 10–35)
Albumin: 4.4 g/dL (ref 3.6–5.1)
BILIRUBIN TOTAL: 0.4 mg/dL (ref 0.2–1.2)
BUN: 8 mg/dL (ref 7–25)
CHLORIDE: 103 mmol/L (ref 98–110)
CO2: 29 mmol/L (ref 20–32)
Calcium: 10 mg/dL (ref 8.6–10.4)
Creat: 0.71 mg/dL (ref 0.50–0.99)
GFR, EST NON AFRICAN AMERICAN: 92 mL/min/{1.73_m2} (ref >=60)
GFR, Est African American: 107 mL/min/{1.73_m2} (ref >=60)
GLOBULIN: 2.9 g/dL (ref 1.9–3.7)
Glucose, Bld: 204 mg/dL — ABNORMAL HIGH (ref 65–99)
POTASSIUM: 5.4 mmol/L — AB (ref 3.5–5.3)
SODIUM: 142 mmol/L (ref 135–146)
Total Protein: 7.3 g/dL (ref 6.1–8.1)

## 2017-09-14 LAB — HEMOGLOBIN A1C
HEMOGLOBIN A1C: 7.9 %{Hb} — AB (ref <5.7)
Mean Plasma Glucose: 180 (calc)
eAG (mmol/L): 10 (calc)

## 2017-09-14 LAB — LIPID PANEL
CHOL/HDL RATIO: 2.9 (calc) (ref <5.0)
Cholesterol: 196 mg/dL (ref <200)
HDL: 68 mg/dL (ref >50)
LDL Cholesterol (Calc): 96 mg/dL (calc)
Non-HDL Cholesterol (Calc): 128 mg/dL (calc) (ref <130)
Triglycerides: 231 mg/dL — ABNORMAL HIGH (ref <150)

## 2017-09-14 LAB — TSH: TSH: 1.88 m[IU]/L (ref 0.40–4.50)

## 2017-09-14 LAB — URIC ACID: Uric Acid, Serum: 3.4 mg/dL (ref 2.5–7.0)

## 2017-10-08 ENCOUNTER — Ambulatory Visit (INDEPENDENT_AMBULATORY_CARE_PROVIDER_SITE_OTHER): Payer: Medicare HMO

## 2017-10-08 ENCOUNTER — Ambulatory Visit: Payer: Medicare HMO | Admitting: Podiatry

## 2017-10-08 DIAGNOSIS — M79671 Pain in right foot: Secondary | ICD-10-CM

## 2017-10-08 DIAGNOSIS — G5763 Lesion of plantar nerve, bilateral lower limbs: Secondary | ICD-10-CM

## 2017-10-08 DIAGNOSIS — M79672 Pain in left foot: Secondary | ICD-10-CM

## 2017-10-08 DIAGNOSIS — D3613 Benign neoplasm of peripheral nerves and autonomic nervous system of lower limb, including hip: Secondary | ICD-10-CM | POA: Diagnosis not present

## 2017-10-08 NOTE — Progress Notes (Signed)
  Subjective:  Patient ID: Mindy Richard, female    DOB: 1956/03/18,  MRN: [id number]  Chief Complaint  Patient presents with  . Foot Pain    bilateral - has been a problem for years - has tried all kindsd of shoes and inserts over the years   62 y.o. female returns for the above complaint.  Reports pain both feet.  States that she has a high arch foot concerned about her gait and her balance.  States that she has tried all sign of inserts and shoes over the years without relief.  Now she has to wear her shoes as of the only shoes that work best for her.  Has custom orthotics therefore a 62 years old but does state that they help endorses diabetes.  Objective:  There were no vitals filed for this visit. General AA&O x3. Normal mood and affect.  Vascular Pedal pulses palpable.  Neurologic Epicritic sensation grossly intact.  Dermatologic No open lesions. Skin normal texture and turgor.  Orthopedic: Cavus foot type bilat Mulder's click 3rd IS bilat   Assessment & Plan:  Patient was evaluated and treated and all questions answered.  Cavus Foot Bilat -XR taken and c/w above. No acute fractures -Discussed OTC orthotics and shoe options  DM without Complications -Neurovascularly intact.  Neuroma Bilat 3 IS -Injeciton bilat as below.  Procedure: Neuroma Injection Location: Bilateral 3rd interspace Skin Prep: Alcohol. Injectate: 0.5 cc 0.5% marcaine plain, 0.5 cc dexamethasone phosphate. Disposition: Patient tolerated procedure well. Injection site dressed with a band-aid.  Return in about 6 weeks (around 11/19/2017) for Neuroma.

## 2017-11-04 ENCOUNTER — Other Ambulatory Visit: Payer: Self-pay

## 2017-11-04 MED ORDER — SAXAGLIPTIN-METFORMIN ER 2.5-1000 MG PO TB24: ORAL_TABLET | ORAL | 0 refills | Status: DC

## 2017-11-05 ENCOUNTER — Encounter: Payer: Self-pay | Admitting: Internal Medicine

## 2017-12-02 ENCOUNTER — Other Ambulatory Visit: Payer: Self-pay | Admitting: Podiatry

## 2017-12-02 DIAGNOSIS — G5763 Lesion of plantar nerve, bilateral lower limbs: Secondary | ICD-10-CM

## 2017-12-03 ENCOUNTER — Ambulatory Visit: Payer: Medicare HMO | Admitting: Podiatry

## 2017-12-28 NOTE — Progress Notes (Signed)
FOLLOW UP  Assessment and Plan:   Hypertension Well controlled with current medications  Monitor blood pressure at home; patient to call if consistently greater than 130/80 Continue DASH diet.   Reminder to go to the ER if any CP, SOB, nausea, dizziness, severe HA, changes vision/speech, left arm numbness and tingling and jaw pain.  Cholesterol Currently above goal; discussed diet at length Continue low cholesterol diet and exercise.  Check lipid panel.   Diabetes with other diabetic neurologic complication Fairly controlled A1C; continue medications: glargine 12 units daily, saxagliptin-metformin 2.09-998 mg BID Try to start exercising - discussed barriers and goal of 150 min weekly Perform daily foot/skin check, notify office of any concerning changes.  Check A1C  GERD Well managed on current medications, didn't tolerate taper to ranitidine Discussed diet, avoiding triggers and other lifestyle changes  Vitamin D Def At goal at last visit; continue supplementation  Defer Vit D level  Diarrhea Not suspicious of infectious origin, ? Stress/anxiety reaction which she apparently commonly has Discussed stool pathogen but patient declines Try imodium at night, add soluble fiber supplement Call if not resolving  Continue diet and meds as discussed. Further disposition pending results of labs. Discussed med's effects and SE's.   Over 30 minutes of exam, counseling, chart review, and critical decision making was performed.   Future Appointments  Date Time Provider [place]  04/07/2018  2:00 PM Unk Pinto, MD [facility] None  09/15/2018  3:45 PM Vicie Mutters, PA-C [facility] None    ----------------------------------------------------------------------------------------------------------------------  HPI 62 y.o. female  presents for 3 month follow up on hypertension, cholesterol, diabetes, weight, GERD without esophagitis (EGD 2017) and vitamin D deficiency.  She is followed closely by psychiatrist for severe depression/anxiety/insomnia that is ongoing.   She reports she has been having liquid diarrhea, 1-2 episodes daily for 2 weeks, with depressed appetite. She denies fever/chills, pain. Has tried imodium but hasn't particularly helped. ? Stress related. She reports she has ongoing GI issues, commonly r/t stress.   BMI is Body mass index is 24.72 kg/m., she has been working on diet but has not been exercising recently due to heat.  Wt Readings from Last 3 Encounters:  12/29/17 144 lb (65.3 kg)  09/08/17 150 lb (68 kg)  06/10/17 149 lb (67.6 kg)   she has a diagnosis of GERD which is currently managed by prilosec 40 mg daily. She has been on this dose for an extended period.  she reports symptoms is currently well controlled, and denies breakthrough reflux, burning in chest, hoarseness or cough.    Her blood pressure has been controlled at home, today their BP is BP: 110/64  She does not workout. She denies chest pain, shortness of breath, dizziness.   She is on cholesterol medication (atorvastatin 40 mg daily) and denies myalgias. Her cholesterol is not at goal. The cholesterol last visit was:   Lab Results  Component Value Date   CHOL 196 09/08/2017   HDL 68 09/08/2017   LDLCALC 96 09/08/2017   TRIG 231 (H) 09/08/2017   CHOLHDL 2.9 09/08/2017    She has been working on diet and exercise for T2 diabetes, currently treated by kombiglyze and lantus 12 units and denies increased appetite, nausea, paresthesia of the feet, polydipsia, polyuria, visual disturbances, vomiting and weight loss. She checks her sugars rarely if she "feels bad" - 1-2 times a month, typically runs high at that time - will be 160-180 - no recent episodes of hypoglycemia recently. Last A1C in the  office was:  Lab Results  Component Value Date   HGBA1C 7.9 (H) 09/08/2017   Patient is on Vitamin D supplement:   Lab Results  Component Value Date   VD25OH 61 06/10/2017      Current Medications:  Current Outpatient Medications on File Prior to Visit  Medication Sig  . acetaminophen (TYLENOL) 500 MG tablet Take 500-1,000 mg by mouth every 4 (four) hours as needed for mild pain, moderate pain, fever or headache. For pain   . atorvastatin (LIPITOR) 40 MG tablet Take 1 tablet (40 mg total) by mouth daily.  . B-D ULTRAFINE III SHORT PEN 31G X 8 MM MISC USE AS DIRECTED  WITH  LANTUS  INJECTIONS  . blood glucose meter kit and supplies Check sugar 3 times a day. DxE11.9Dispense based insurance preference.  Marland Kitchen buPROPion (WELLBUTRIN XL) 300 MG 24 hr tablet Take 300 mg by mouth daily.   . Cholecalciferol (VITAMIN D) 2000 units tablet Take 2,000 Units by mouth daily with breakfast.  . diclofenac sodium (VOLTAREN) 1 % GEL Apply 2 g topically 4 (four) times daily. (Patient taking differently: Apply 2 g topically 4 (four) times daily as needed (for pain). )  . glucose blood test strip One touch Test blood sugar TID PRN DX insulin dependent diabetic E11.9  . hydrocortisone (PROCTO-PAK) 1 % CREA Apply once or twice daily as needed for hemorrhoids  . LANTUS SOLOSTAR 100 UNIT/ML Solostar Pen INJECT  8 UNITS SUBCUTANEOUSLY EVERY DAY AS DIRECTED OR AS DIRECTED BY PRIMARY CARE PHYSICIAN DISCARD PEN 28 DAYS AFTER OPENING  . losartan (COZAAR) 100 MG tablet TAKE 1/2 TO 1 TABLET DAILY AS DIRECTED FOR BLOOD PRESSURE AND KIDNEY PROTECTION  . meloxicam (MOBIC) 7.5 MG tablet Take 1 tablet (7.5 mg total) by mouth 2 (two) times daily as needed for pain.  Marland Kitchen omeprazole (PRILOSEC) 40 MG capsule TAKE 1 CAPSULE EVERY DAY  . Saxagliptin-Metformin (KOMBIGLYZE XR) 2.09-998 MG TB24 TAKE 1 TABLET TWICE DAILY WITH A MEAL  . triamcinolone ointment (KENALOG) 0.1 % Apply 1 application topically 2 (two) times daily.  Marland Kitchen venlafaxine XR (EFFEXOR-XR) 150 MG 24 hr capsule Take 150 mg by mouth daily with breakfast.   . zolpidem (AMBIEN) 10 MG tablet Take 10 mg by mouth at bedtime.   . gabapentin (NEURONTIN) 100  MG capsule Take 1 capsule (100 mg total) by mouth 3 (three) times daily. (Patient not taking: Reported on 12/29/2017)   No current facility-administered medications on file prior to visit.      Allergies:  Allergies  Allergen Reactions  . Codeine Palpitations and Other (See Comments)    Makes her head feel "weird"  . Glucophage [Metformin Hcl] Diarrhea  . Nsaids Other (See Comments)    "upset stomach"     Medical History:  Past Medical History:  Diagnosis Date  . Anxiety   . Chest pain 04/2011   saw Dr Ron Parker..not heart related  sent to GI dr. and found gallstones.  . Complication of anesthesia 2001; 20012   "slow to wake up"  . Depression   . GERD (gastroesophageal reflux disease)   . H/O hiatal hernia   . Hypercholesteremia   . Hypertension   . Mental disorder   . Renal disorder   . Type II diabetes mellitus ([place]) 2003   Family history- Reviewed and unchanged Social history- Reviewed and unchanged   Review of Systems:  Review of Systems  Constitutional: Negative for malaise/fatigue and weight loss.  HENT: Negative for hearing loss and tinnitus.  Eyes: Negative for blurred vision and double vision.  Respiratory: Negative for cough, shortness of breath and wheezing.   Cardiovascular: Negative for chest pain, palpitations, orthopnea, claudication and leg swelling.  Gastrointestinal: Positive for diarrhea (1-2 episodes/day). Negative for abdominal pain, blood in stool, constipation, heartburn, melena, nausea and vomiting.  Genitourinary: Negative.   Musculoskeletal: Negative for joint pain and myalgias.  Skin: Negative for rash.  Neurological: Negative for dizziness, tingling, sensory change, weakness and headaches.  Endo/Heme/Allergies: Negative for polydipsia.  Psychiatric/Behavioral: Positive for depression. Negative for memory loss, substance abuse and suicidal ideas. The patient is nervous/anxious and has insomnia.   All other systems reviewed and are  negative.   Physical Exam: BP 110/64   Pulse 81   Temp (!) 97.5 F (36.4 C)   Ht '5\' 4"'$  (1.626 m)   Wt 144 lb (65.3 kg)   SpO2 97%   BMI 24.72 kg/m  Wt Readings from Last 3 Encounters:  12/29/17 144 lb (65.3 kg)  09/08/17 150 lb (68 kg)  06/10/17 149 lb (67.6 kg)   General Appearance: Well nourished, in no apparent distress. Eyes: PERRLA, EOMs, conjunctiva no swelling or erythema Sinuses: No Frontal/maxillary tenderness ENT/Mouth: Ext aud canals clear, TMs without erythema, bulging. No erythema, swelling, or exudate on post pharynx.  Tonsils not swollen or erythematous. Hearing normal.  Neck: Supple, thyroid normal.  Respiratory: Respiratory effort normal, BS equal bilaterally without rales, rhonchi, wheezing or stridor.  Cardio: RRR with no MRGs. Brisk peripheral pulses without edema.  Abdomen: Soft, + BS.  Non tender, no guarding, rebound, hernias, masses. Lymphatics: Non tender without lymphadenopathy.  Musculoskeletal: Full ROM, 5/5 strength, Normal gait Skin: Warm, dry without rashes, lesions, ecchymosis.  Neuro: Cranial nerves intact. No cerebellar symptoms.  Psych: Awake and oriented X 3, anxious affect, Insight and Judgment appropriate.    Izora Ribas, NP 4:21 PM [facility]

## 2017-12-29 ENCOUNTER — Encounter: Payer: Self-pay | Admitting: Adult Health

## 2017-12-29 ENCOUNTER — Ambulatory Visit (INDEPENDENT_AMBULATORY_CARE_PROVIDER_SITE_OTHER): Payer: Medicare HMO | Admitting: Adult Health

## 2017-12-29 VITALS — BP 110/64 | HR 81 | Temp 97.5°F | Ht 64.0 in | Wt 144.0 lb

## 2017-12-29 DIAGNOSIS — E1169 Type 2 diabetes mellitus with other specified complication: Secondary | ICD-10-CM

## 2017-12-29 DIAGNOSIS — E119 Type 2 diabetes mellitus without complications: Secondary | ICD-10-CM

## 2017-12-29 DIAGNOSIS — E785 Hyperlipidemia, unspecified: Secondary | ICD-10-CM | POA: Diagnosis not present

## 2017-12-29 DIAGNOSIS — E559 Vitamin D deficiency, unspecified: Secondary | ICD-10-CM | POA: Diagnosis not present

## 2017-12-29 DIAGNOSIS — Z794 Long term (current) use of insulin: Secondary | ICD-10-CM | POA: Diagnosis not present

## 2017-12-29 DIAGNOSIS — K2100 Gastro-esophageal reflux disease with esophagitis, without bleeding: Secondary | ICD-10-CM

## 2017-12-29 DIAGNOSIS — Z79899 Other long term (current) drug therapy: Secondary | ICD-10-CM

## 2017-12-29 DIAGNOSIS — F325 Major depressive disorder, single episode, in full remission: Secondary | ICD-10-CM

## 2017-12-29 DIAGNOSIS — I10 Essential (primary) hypertension: Secondary | ICD-10-CM

## 2017-12-29 NOTE — Patient Instructions (Signed)
Try imodium at night and daily soluble fiber supplement  Please start checking a fasting (morning) sugar at least a few times/week  Goal for A1C is <7%   Chronic Diarrhea Diarrhea is a condition in which a person passes frequent loose and watery stools. It can cause you to feel weak and dehydrated. Dehydration can make you tired and thirsty. It can also cause a dry mouth, decreased urination, and dark yellow urine. Diarrhea is a sign of another underlying problem, such as:  Infection.  Medication side effects.  Dietary intolerance, such as lactose intolerance.  Conditions such as celiac disease, irritable bowel syndrome (IBS), or inflammatory bowel disease (IBD).  In most cases, diarrhea lasts 2-3 days. Diarrhea that lasts longer than 4 weeks is called long-lasting (chronic) diarrhea. It is important that you treat your diarrhea as told by your health care provider. Follow these instructions at home: Follow these recommendations as told by your health care provider. Eating and drinking  Take an oral rehydration solution (ORS). This is a drink that is designed to keep you hydrated. It can be found at pharmacies and retail stores.  Drink clear fluids, such as water, ice chips, diluted fruit juice, and low-calorie sports drinks.  Follow the diet recommended by your health care provider. You may need to avoid foods that trigger diarrhea for you.  Avoid foods and beverages that contain a lot of sugar or caffeine.  Avoid alcohol.  Avoid spicy or fatty foods. General instructions  Drink enough fluid to keep your urine clear or pale yellow.  Wash your hands often and after each diarrhea episode. If soap and water are not available, use hand sanitizer.  Make sure that all people in your household wash their hands well and often.  Take over-the-counter and prescription medicines only as told by your health care provider.  If you were prescribed an antibiotic medicine, take it as  told by your health care provider. Do not stop taking the antibiotic even if you start to feel better.  Rest at home while you recover.  Watch your condition for any changes.  Take a warm bath to relieve any burning or pain from frequent diarrhea episodes.  Keep all follow-up visits as told by your health care provider. This is important. Contact a health care provider if:  You have a fever.  Your diarrhea gets worse or does not get better.  You have new symptoms.  You cannot drink fluids without vomiting.  You feel light-headed or dizzy.  You have a headache.  You have muscle cramps.  You have severe pain in the rectum. Get help right away if:  You have persistent vomiting.  You have chest pain.  You feel extremely weak or you faint.  You have bloody or black stools, or stools that look like tar.  You have severe pain, cramping, or bloating in your abdomen, or pain that stays in one place.  You have trouble breathing or you are breathing very quickly.  Your heart is beating very quickly.  Your skin feels cold and clammy.  You feel confused.  You have a severe headache.  You have signs of dehydration, such as: ? Dark urine, very little urine, or no urine. ? Cracked lips. ? Dry mouth. ? Sunken eyes. ? Sleepiness. ? Weakness. Summary  Chronic diarrhea is a condition in which a person passes frequent loose and watery stools for more than 4 weeks.  Diarrhea is a sign of another underlying problem.  Drink enough  fluid to keep your urine clear or pale yellow to avoid dehydration.  Wash your hands often and after each diarrhea episode. If soap and water are not available, use hand sanitizer.  It is important that you treat your diarrhea as told by your health care provider. This information is not intended to replace advice given to you by your health care provider. Make sure you discuss any questions you have with your health care provider. Document  Released: 07/26/2003 Document Revised: 03/24/2016 Document Reviewed: 03/24/2016 Elsevier Interactive Patient Education  2017 [employer].

## 2017-12-30 LAB — MAGNESIUM: Magnesium: 1.8 mg/dL (ref 1.5–2.5)

## 2017-12-30 LAB — LIPID PANEL
CHOL/HDL RATIO: 2.2 (calc) (ref <5.0)
CHOLESTEROL: 130 mg/dL (ref <200)
HDL: 59 mg/dL (ref >50)
LDL CHOLESTEROL (CALC): 48 mg/dL
NON-HDL CHOLESTEROL (CALC): 71 mg/dL (ref <130)
TRIGLYCERIDES: 149 mg/dL (ref <150)

## 2017-12-30 LAB — COMPLETE METABOLIC PANEL WITH GFR
AG RATIO: 1.7 (calc) (ref 1.0–2.5)
ALT: 17 U/L (ref 6–29)
AST: 19 U/L (ref 10–35)
Albumin: 4.5 g/dL (ref 3.6–5.1)
Alkaline phosphatase (APISO): 78 U/L (ref 33–130)
BUN: 7 mg/dL (ref 7–25)
CALCIUM: 9.9 mg/dL (ref 8.6–10.4)
CO2: 25 mmol/L (ref 20–32)
CREATININE: 0.73 mg/dL (ref 0.50–0.99)
Chloride: 104 mmol/L (ref 98–110)
GFR, EST AFRICAN AMERICAN: 103 mL/min/{1.73_m2} (ref >=60)
GFR, EST NON AFRICAN AMERICAN: 89 mL/min/{1.73_m2} (ref >=60)
Globulin: 2.7 g/dL (calc) (ref 1.9–3.7)
Glucose, Bld: 203 mg/dL — ABNORMAL HIGH (ref 65–99)
Potassium: 4.7 mmol/L (ref 3.5–5.3)
Sodium: 143 mmol/L (ref 135–146)
TOTAL PROTEIN: 7.2 g/dL (ref 6.1–8.1)
Total Bilirubin: 0.4 mg/dL (ref 0.2–1.2)

## 2017-12-30 LAB — HEMOGLOBIN A1C
Hgb A1c MFr Bld: 7.5 % of total Hgb — ABNORMAL HIGH (ref <5.7)
MEAN PLASMA GLUCOSE: 169 (calc)
eAG (mmol/L): 9.3 (calc)

## 2017-12-30 LAB — CBC WITH DIFFERENTIAL/PLATELET
BASOS PCT: 0.6 %
Basophils Absolute: 58 cells/uL (ref 0–200)
EOS PCT: 4.2 %
Eosinophils Absolute: 407 cells/uL (ref 15–500)
HCT: 37.5 % (ref 35.0–45.0)
Hemoglobin: 12.5 g/dL (ref 11.7–15.5)
LYMPHS ABS: 2697 {cells}/uL (ref 850–3900)
MCH: 27.1 pg (ref 27.0–33.0)
MCHC: 33.3 g/dL (ref 32.0–36.0)
MCV: 81.3 fL (ref 80.0–100.0)
MPV: 10.4 fL (ref 7.5–12.5)
Monocytes Relative: 6.9 %
Neutro Abs: 5869 cells/uL (ref 1500–7800)
Neutrophils Relative %: 60.5 %
PLATELETS: 422 10*3/uL — AB (ref 140–400)
RBC: 4.61 10*6/uL (ref 3.80–5.10)
RDW: 13.7 % (ref 11.0–15.0)
TOTAL LYMPHOCYTE: 27.8 %
WBC mixed population: 669 cells/uL (ref 200–950)
WBC: 9.7 10*3/uL (ref 3.8–10.8)

## 2017-12-30 LAB — TSH: TSH: 2 mIU/L (ref 0.40–4.50)

## 2018-01-12 ENCOUNTER — Other Ambulatory Visit: Payer: Self-pay | Admitting: Internal Medicine

## 2018-02-08 ENCOUNTER — Other Ambulatory Visit: Payer: Self-pay | Admitting: Internal Medicine

## 2018-02-08 DIAGNOSIS — K219 Gastro-esophageal reflux disease without esophagitis: Secondary | ICD-10-CM

## 2018-03-01 DIAGNOSIS — Z01419 Encounter for gynecological examination (general) (routine) without abnormal findings: Secondary | ICD-10-CM | POA: Diagnosis not present

## 2018-03-01 DIAGNOSIS — Z6824 Body mass index (BMI) 24.0-24.9, adult: Secondary | ICD-10-CM | POA: Diagnosis not present

## 2018-03-01 DIAGNOSIS — Z124 Encounter for screening for malignant neoplasm of cervix: Secondary | ICD-10-CM | POA: Diagnosis not present

## 2018-03-01 DIAGNOSIS — Z1231 Encounter for screening mammogram for malignant neoplasm of breast: Secondary | ICD-10-CM | POA: Diagnosis not present

## 2018-03-02 LAB — HM MAMMOGRAPHY

## 2018-03-08 ENCOUNTER — Other Ambulatory Visit: Payer: Self-pay | Admitting: Internal Medicine

## 2018-03-23 ENCOUNTER — Encounter: Payer: Self-pay | Admitting: *Deleted

## 2018-04-01 ENCOUNTER — Telehealth: Payer: Self-pay | Admitting: Internal Medicine

## 2018-04-01 NOTE — Telephone Encounter (Signed)
Left voicemail, to call office. question regarding medical record release form

## 2018-04-07 ENCOUNTER — Encounter: Payer: Self-pay | Admitting: Internal Medicine

## 2018-05-24 ENCOUNTER — Encounter (HOSPITAL_COMMUNITY): Payer: Self-pay | Admitting: Emergency Medicine

## 2018-05-24 ENCOUNTER — Emergency Department (HOSPITAL_COMMUNITY): Payer: Medicare HMO

## 2018-05-24 ENCOUNTER — Emergency Department (HOSPITAL_COMMUNITY)
Admission: EM | Admit: 2018-05-24 | Discharge: 2018-05-24 | Disposition: A | Discharge status: Home / Self Care | Payer: Medicare HMO | Attending: Emergency Medicine | Admitting: Emergency Medicine

## 2018-05-24 DIAGNOSIS — Z79899 Other long term (current) drug therapy: Secondary | ICD-10-CM | POA: Insufficient documentation

## 2018-05-24 DIAGNOSIS — I10 Essential (primary) hypertension: Secondary | ICD-10-CM | POA: Insufficient documentation

## 2018-05-24 DIAGNOSIS — N138 Other obstructive and reflux uropathy: Secondary | ICD-10-CM | POA: Diagnosis not present

## 2018-05-24 DIAGNOSIS — N200 Calculus of kidney: Secondary | ICD-10-CM | POA: Insufficient documentation

## 2018-05-24 DIAGNOSIS — R109 Unspecified abdominal pain: Secondary | ICD-10-CM

## 2018-05-24 DIAGNOSIS — Z794 Long term (current) use of insulin: Secondary | ICD-10-CM | POA: Diagnosis not present

## 2018-05-24 DIAGNOSIS — E119 Type 2 diabetes mellitus without complications: Secondary | ICD-10-CM | POA: Diagnosis not present

## 2018-05-24 DIAGNOSIS — R1032 Left lower quadrant pain: Secondary | ICD-10-CM | POA: Insufficient documentation

## 2018-05-24 LAB — CBC
HCT: 37.5 % (ref 36.0–46.0)
HEMOGLOBIN: 12 g/dL (ref 12.0–15.0)
MCH: 27.9 pg (ref 26.0–34.0)
MCHC: 32 g/dL (ref 30.0–36.0)
MCV: 87.2 fL (ref 80.0–100.0)
Platelets: 373 10*3/uL (ref 150–400)
RBC: 4.3 MIL/uL (ref 3.87–5.11)
RDW: 13.9 % (ref 11.5–15.5)
WBC: 11.7 10*3/uL — ABNORMAL HIGH (ref 4.0–10.5)
nRBC: 0 % (ref 0.0–0.2)

## 2018-05-24 LAB — COMPREHENSIVE METABOLIC PANEL
ALT: 20 U/L (ref 0–44)
AST: 25 U/L (ref 15–41)
Albumin: 4.1 g/dL (ref 3.5–5.0)
Alkaline Phosphatase: 84 U/L (ref 38–126)
Anion gap: 13 (ref 5–15)
BUN: 10 mg/dL (ref 8–23)
CO2: 21 mmol/L — ABNORMAL LOW (ref 22–32)
Calcium: 9 mg/dL (ref 8.9–10.3)
Chloride: 106 mmol/L (ref 98–111)
Creatinine, Ser: 0.71 mg/dL (ref 0.44–1.00)
GFR calc Af Amer: >60 mL/min (ref >60)
GFR calc non Af Amer: >60 mL/min (ref >60)
Glucose, Bld: 224 mg/dL — ABNORMAL HIGH (ref 70–99)
Potassium: 3.7 mmol/L (ref 3.5–5.1)
Sodium: 140 mmol/L (ref 135–145)
Total Bilirubin: 0.5 mg/dL (ref 0.3–1.2)
Total Protein: 7.3 g/dL (ref 6.5–8.1)

## 2018-05-24 LAB — URINALYSIS, ROUTINE W REFLEX MICROSCOPIC
Bilirubin Urine: NEGATIVE
Glucose, UA: NEGATIVE mg/dL
Ketones, ur: NEGATIVE mg/dL
Leukocytes, UA: NEGATIVE
Nitrite: NEGATIVE
Protein, ur: 30 mg/dL — AB
RBC / HPF: >50 RBC/hpf — ABNORMAL HIGH (ref 0–5)
Specific Gravity, Urine: 1.026 (ref 1.005–1.030)
pH: 5 (ref 5.0–8.0)

## 2018-05-24 LAB — LIPASE, BLOOD: Lipase: 40 U/L (ref 11–51)

## 2018-05-24 MED ORDER — SODIUM CHLORIDE 0.9 % IV BOLUS
1000.0000 mL | Freq: Once | INTRAVENOUS | Status: AC
  Administered 2018-05-24: 1000 mL via INTRAVENOUS

## 2018-05-24 MED ORDER — TAMSULOSIN HCL 0.4 MG PO CAPS: 0.4000 mg | ORAL_CAPSULE | Freq: Every day | ORAL | 0 refills | Status: AC

## 2018-05-24 MED ORDER — MORPHINE SULFATE (PF) 4 MG/ML IV SOLN
4.0000 mg | Freq: Once | INTRAVENOUS | Status: AC
  Administered 2018-05-24: 4 mg via INTRAVENOUS
  Filled 2018-05-24: qty 1

## 2018-05-24 MED ORDER — SODIUM CHLORIDE (PF) 0.9 % IJ SOLN
INTRAMUSCULAR | Status: AC
  Filled 2018-05-24: qty 50

## 2018-05-24 MED ORDER — IOPAMIDOL (ISOVUE-300) INJECTION 61%
INTRAVENOUS | Status: AC
  Filled 2018-05-24: qty 100

## 2018-05-24 MED ORDER — KETOROLAC TROMETHAMINE 30 MG/ML IJ SOLN
30.0000 mg | Freq: Once | INTRAMUSCULAR | Status: AC
  Administered 2018-05-24: 30 mg via INTRAVENOUS
  Filled 2018-05-24: qty 1

## 2018-05-24 MED ORDER — IOPAMIDOL (ISOVUE-300) INJECTION 61%
100.0000 mL | Freq: Once | INTRAVENOUS | Status: AC | PRN
  Administered 2018-05-24: 100 mL via INTRAVENOUS

## 2018-05-24 MED ORDER — OXYCODONE-ACETAMINOPHEN 5-325 MG PO TABS: 2.0000 | ORAL_TABLET | ORAL | 0 refills | Status: AC | PRN

## 2018-05-24 NOTE — ED Triage Notes (Signed)
Pt c/o LLQ pains that started yesterday morning. Denies any n/v/d or urinary problems.

## 2018-05-24 NOTE — Discharge Instructions (Addendum)
I have provided medication to help with the 3 mm stone expulsion, please take Flomax daily for the next 14 days or until stone has passed. I have also provided medication to help with the pain, please take this as directed. If you experience any fever, shortness of breath or worsening symptoms please return to the ED. I have provided a referral to Dr. Jeffie Pollock from urology, please schedule an appointment with him for follow up.

## 2018-05-24 NOTE — ED Notes (Signed)
Discharge instructions reviewed with patient. Patient verbalizes understanding. VSS.   

## 2018-05-24 NOTE — ED Provider Notes (Signed)
[place] [facility] DEPT Provider Note   CSN: [id number] Arrival date & time: 05/24/18  1147     History   Chief Complaint Chief Complaint  Patient presents with  . Abdominal Pain    HPI Mindy Richard is a 63 y.o. female.  63 y.o female with a PMH of GERD, HTN, DM, presents to the ED with a chief complaint of abdominal pain x yesterday morning.  Reports waking up yesterday morning and urinating this is 1 with her pain along the left groin began.  She reports taking 2 500 mg Tylenol without relief.  He called her primary care on call who suggested to apply heat to the area which seemed to help with symptoms.  Reports the pain on her left groin has been steady but is exacerbated with urination and bowel movements.  Patient reports she has diarrhea at baseline but has had a solid bowel movement yesterday however today had another episode of diarrhea.  She reports being unable to eat as she is got some severe nausea but denies any vomiting.  Ports her pain is a constant dull 9 out of 10.  She denies any fever, chest pain, shortness of breath, blood in her emesis or stool.     Past Medical History:  Diagnosis Date  . Anxiety   . Chest pain 04/2011   saw Dr Ron Parker..not heart related  sent to GI dr. and found gallstones.  . Complication of anesthesia 2001; 20012   "slow to wake up"  . Depression   . GERD (gastroesophageal reflux disease)   . H/O hiatal hernia   . Hypercholesteremia   . Hypertension   . Mental disorder   . Renal disorder   . Type II diabetes mellitus ([place]) 2003    Patient Active Problem List   Diagnosis Date Noted  . Encounter for long-term (current) use of insulin ([place]) 08/07/2016  . OSA (obstructive sleep apnea) 06/04/2015  . Neuropathy, diabetic ([place]) 04/16/2015  . Medicare annual wellness visit, initial 01/11/2015  . Insulin-requiring or dependent type II diabetes mellitus ([place]) 08/10/2013  . Vitamin D deficiency 04/03/2013  .  Medication management 04/03/2013  . Hyperlipidemia associated with type 2 diabetes mellitus ([place]) 04/03/2013  . Hypertension 05/09/2011  . Depression, major, in remission ([place]) 05/09/2011  . GERD 05/09/2011    Past Surgical History:  Procedure Laterality Date  . CARPAL TUNNEL RELEASE  2001   right  . CARPAL TUNNEL RELEASE Left 2017  . CESAREAN SECTION  1985  . CHOLECYSTECTOMY  09/02/11  . CHOLECYSTECTOMY  09/02/2011   Procedure: LAPAROSCOPIC CHOLECYSTECTOMY WITH INTRAOPERATIVE CHOLANGIOGRAM;  Surgeon: Haywood Lasso, MD;  Location: [facility];  Service: General;  Laterality: N/A;  . COLONOSCOPY    . DILATION AND CURETTAGE OF UTERUS  1984  . ERCP  09/03/2011   Procedure: ENDOSCOPIC RETROGRADE CHOLANGIOPANCREATOGRAPHY (ERCP);  Surgeon: Missy Sabins, MD;  Location[facility] ENDOSCOPY;  Service: Endoscopy;  Laterality: N/A;  sphincterotomy and stone extraction  . SHOULDER ARTHROSCOPY W/ ROTATOR CUFF REPAIR  2012   left  . TONSILLECTOMY  ~ 1970  . TUBAL LIGATION  1985?     OB History   No obstetric history on file.      Home Medications    Prior to Admission medications   Medication Sig Start Date End Date Taking? Authorizing Provider  acetaminophen (TYLENOL) 500 MG tablet Take 500-1,000 mg by mouth every 4 (four) hours as needed for mild pain, moderate pain, fever or headache. For  pain     [provider]  atorvastatin (LIPITOR) 40 MG tablet Take 1 tablet (40 mg total) by mouth daily. 09/08/17 09/08/18  Vicie Mutters, PA-C  blood glucose meter kit and supplies Check sugar 3 times a day. DxE11.9Dispense based insurance preference. 09/11/17   Vicie Mutters, PA-C  buPROPion (WELLBUTRIN XL) 300 MG 24 hr tablet Take 300 mg by mouth daily.  05/21/14   [provider]  Cholecalciferol (VITAMIN D) 2000 units tablet Take 2,000 Units by mouth daily with breakfast.    [provider]  diclofenac sodium (VOLTAREN) 1 % GEL Apply 2 g topically 4 (four) times daily. Patient  taking differently: Apply 2 g topically 4 (four) times daily as needed (for pain).  02/04/16   Unk Pinto, MD  DROPLET PEN NEEDLES 31G X 8 MM MISC USE AS DIRECTED  WITH  LANTUS  INJECTIONS 03/08/18   Unk Pinto, MD  gabapentin (NEURONTIN) 100 MG capsule Take 1 capsule (100 mg total) by mouth 3 (three) times daily. Patient not taking: Reported on 12/29/2017 09/08/17 09/08/18  Vicie Mutters, PA-C  glucose blood test strip One touch Test blood sugar TID PRN DX insulin dependent diabetic E11.9 09/09/17   Vicie Mutters, PA-C  hydrocortisone (PROCTO-PAK) 1 % CREA Apply once or twice daily as needed for hemorrhoids 08/07/16   Vicie Mutters, PA-C  LANTUS SOLOSTAR 100 UNIT/ML Solostar Pen INJECT 8 UNITS SUBCUTANEOUSLY EVERY DAY AS DIRECTED OR AS DIRECTED BY PRIMARY CARE PHYSICIAN DISCARD PEN 28 DAYS AFTER OPENING 01/12/18   Unk Pinto, MD  losartan (COZAAR) 100 MG tablet TAKE 1/2 TO 1 TABLET DAILY AS DIRECTED FOR BLOOD PRESSURE AND KIDNEY PROTECTION Patient taking differently: Take 50-100 mg by mouth daily.  12/03/16   Unk Pinto, MD  meloxicam (MOBIC) 7.5 MG tablet Take 1 tablet (7.5 mg total) by mouth 2 (two) times daily as needed for pain. 11/13/15   Shawnee Knapp, MD  omeprazole (PRILOSEC) 40 MG capsule TAKE 1 CAPSULE EVERY DAY 02/08/18   Unk Pinto, MD  Saxagliptin-Metformin (KOMBIGLYZE XR) 2.09-998 MG TB24 TAKE 1 TABLET TWICE DAILY WITH A MEAL 11/04/17   Unk Pinto, MD  tamsulosin (FLOMAX) 0.4 MG CAPS capsule Take 1 capsule (0.4 mg total) by mouth daily for 14 days. 05/24/18 06/07/18  Janeece Fitting, PA-C  triamcinolone ointment (KENALOG) 0.1 % Apply 1 application topically 2 (two) times daily. 08/07/16   Vicie Mutters, PA-C  venlafaxine XR (EFFEXOR-XR) 150 MG 24 hr capsule Take 150 mg by mouth daily with breakfast.  05/21/14   [provider]  zolpidem (AMBIEN) 10 MG tablet Take 10 mg by mouth at bedtime.  05/06/11   [provider]    Family History Family  History  Problem Relation Age of Onset  . Cancer Maternal Aunt        breast  . Heart attack Father 60       Smoker  . Diabetes Mother   . Hyperlipidemia Mother   . Anesthesia problems Neg Hx   . Hypotension Neg Hx     Social History Social History   Tobacco Use  . Smoking status: Never Smoker  . Smokeless tobacco: Never Used  Substance Use Topics  . Alcohol use: No  . Drug use: No     Allergies   Codeine; Glucophage [metformin hcl]; and Nsaids   Review of Systems Review of Systems  Constitutional: Negative for fever.  HENT: Negative for sore throat.   Respiratory: Negative for shortness of breath.   Cardiovascular:  Negative for chest pain.  Gastrointestinal: Positive for abdominal pain, diarrhea and nausea. Negative for vomiting.  Genitourinary: Positive for flank pain.  Musculoskeletal: Negative for back pain.  Skin: Negative for pallor and wound.  Neurological: Negative for light-headedness and headaches.  All other systems reviewed and are negative.    Physical Exam Updated Vital Signs BP 132/70   Pulse 82   Temp 97.9 F (36.6 C) (Oral)   Resp 15   SpO2 99%   Physical Exam Vitals signs and nursing note reviewed.  Constitutional:      General: She is not in acute distress.    Appearance: She is well-developed.  HENT:     Head: Normocephalic and atraumatic.     Mouth/Throat:     Pharynx: No oropharyngeal exudate.  Eyes:     Pupils: Pupils are equal, round, and reactive to light.  Neck:     Musculoskeletal: Normal range of motion.  Cardiovascular:     Rate and Rhythm: Regular rhythm.     Heart sounds: Normal heart sounds.  Pulmonary:     Effort: Pulmonary effort is normal. No respiratory distress.     Breath sounds: Normal breath sounds.  Abdominal:     General: Bowel sounds are decreased. There is no distension.     Palpations: Abdomen is soft.     Tenderness: There is abdominal tenderness (slight) in the left lower quadrant. There is left  CVA tenderness (mild). There is no right CVA tenderness.  Musculoskeletal:        General: No tenderness or deformity.     Right lower leg: No edema.     Left lower leg: No edema.  Skin:    General: Skin is warm and dry.  Neurological:     Mental Status: She is alert and oriented to person, place, and time.      ED Treatments / Results  Labs (all labs ordered are listed, but only abnormal results are displayed) Labs Reviewed  COMPREHENSIVE METABOLIC PANEL - Abnormal; Notable for the following components:      Result Value   CO2 21 (*)    Glucose, Bld 224 (*)    All other components within normal limits  CBC - Abnormal; Notable for the following components:   WBC 11.7 (*)    All other components within normal limits  URINALYSIS, ROUTINE W REFLEX MICROSCOPIC - Abnormal; Notable for the following components:   APPearance HAZY (*)    Hgb urine dipstick LARGE (*)    Protein, ur 30 (*)    RBC / HPF >50 (*)    Bacteria, UA RARE (*)    All other components within normal limits  LIPASE, BLOOD    EKG None  Radiology Ct Abdomen Pelvis W Contrast  Result Date: 05/24/2018 CLINICAL DATA:  Left lower quadrant pain. Flank pain. EXAM: CT ABDOMEN AND PELVIS WITH CONTRAST TECHNIQUE: Multidetector CT imaging of the abdomen and pelvis was performed using the standard protocol following bolus administration of intravenous contrast. CONTRAST:  149m ISOVUE-300 IOPAMIDOL (ISOVUE-300) INJECTION 61% COMPARISON:  CT scan dated 07/01/2016 FINDINGS: Lower chest: Normal. Hepatobiliary: 4 mm cyst in the dome of the right lobe of the otherwise normal liver. Cholecystectomy. No biliary ductal dilatation. Pancreas: Unremarkable. No pancreatic ductal dilatation or surrounding inflammatory changes. Spleen: Normal in size without focal abnormality. Adrenals/Urinary Tract: There is a 3 mm stone in the mid left ureter at the L4-5 level creating mild left hydronephrosis. There are no renal calculi. Right kidney is  normal. Adrenal glands are normal. Distal ureters are normal. Stomach/Bowel: Stomach is within normal limits. Appendix appears normal. No evidence of bowel wall thickening, distention, or inflammatory changes. Vascular/Lymphatic: No significant vascular findings are present. No enlarged abdominal or pelvic lymph nodes. Reproductive: Uterus and bilateral adnexa are unremarkable. Other: No abdominal wall hernia or abnormality. No abdominopelvic ascites. Musculoskeletal: No acute or significant osseous findings. IMPRESSION: 3 mm stone obstructing the mid left ureter creating mild left hydronephrosis. Otherwise negative exam. No renal calculi. Electronically Signed   By: Lorriane Shire M.D.   On: 05/24/2018 14:48    Procedures Procedures (including critical care time)  Medications Ordered in ED Medications  iopamidol (ISOVUE-300) 61 % injection (has no administration in time range)  sodium chloride (PF) 0.9 % injection (has no administration in time range)  morphine 4 MG/ML injection 4 mg (4 mg Intravenous Given 05/24/18 1343)  sodium chloride 0.9 % bolus 1,000 mL (1,000 mLs Intravenous Bolus 05/24/18 1343)  iopamidol (ISOVUE-300) 61 % injection 100 mL (100 mLs Intravenous Contrast Given 05/24/18 1419)  ketorolac (TORADOL) 30 MG/ML injection 30 mg (30 mg Intravenous Given 05/24/18 1504)     Initial Impression / Assessment and Plan / ED Course  I have reviewed the triage vital signs and the nursing notes.  Pertinent labs & imaging results that were available during my care of the patient were reviewed by me and considered in my medical decision making (see chart for details).    Presents with left flank pain which began after urinating this morning, she reports taking Tylenol 500 mg with no relieving symptoms.  CBC shows slight increase in white blood cell count, hemoglobin stable.  CMP showed no electrolyte normality, glucose is slightly elevated with consistent with patient's previous visits. This was  within normal limits, low suspicion for any pancreatitis.  Reports her pain is very severe on the left flank, she has had a kidney stone in the past, reports similar symptoms.  Will obtain CT ultrasound with contrast as patient reports some left lower quadrant pain and some episodes of diarrhea.  CT to rule out any diverticulitis, stone disease or other acute process. UA showed large amount of hemoglobin, protein, RBC > 50, bacteria is rare.  Patient for stone disease. CT with contrast showed: 3 mm stone obstructing the mid left ureter creating mild left  hydronephrosis. Otherwise negative exam. No renal calculi.     3:23 PM to Dr. Jeffie Pollock from urology who advised patient to being sent home on Flomax along with pain medication, she should follow-up with him in office.  If her symptoms worsen she may return to the ED for reevaluation. Patient understands and agrees with management.  Vitals stable, afebrile.  Return precautions provided.   Final Clinical Impressions(s) / ED Diagnoses   Final diagnoses:  Urinary tract obstruction by kidney stone  Left flank pain    ED Discharge Orders         Ordered    tamsulosin (FLOMAX) 0.4 MG CAPS capsule  Daily     05/24/18 1518           Janeece Fitting, PA-C 05/24/18 [place], [place], DO 05/24/18 1546

## 2018-09-15 ENCOUNTER — Ambulatory Visit: Payer: Self-pay | Admitting: Physician Assistant

## 2019-01-09 ENCOUNTER — Other Ambulatory Visit: Payer: Self-pay | Admitting: Internal Medicine

## 2019-04-05 DIAGNOSIS — E118 Type 2 diabetes mellitus with unspecified complications: Secondary | ICD-10-CM | POA: Diagnosis not present

## 2019-04-05 DIAGNOSIS — R21 Rash and other nonspecific skin eruption: Secondary | ICD-10-CM | POA: Diagnosis not present

## 2019-04-05 DIAGNOSIS — E559 Vitamin D deficiency, unspecified: Secondary | ICD-10-CM | POA: Diagnosis not present

## 2019-04-05 DIAGNOSIS — Z136 Encounter for screening for cardiovascular disorders: Secondary | ICD-10-CM | POA: Diagnosis not present

## 2019-04-05 DIAGNOSIS — K219 Gastro-esophageal reflux disease without esophagitis: Secondary | ICD-10-CM | POA: Diagnosis not present

## 2019-04-05 DIAGNOSIS — F339 Major depressive disorder, recurrent, unspecified: Secondary | ICD-10-CM | POA: Diagnosis not present

## 2019-04-05 DIAGNOSIS — Z0000 Encounter for general adult medical examination without abnormal findings: Secondary | ICD-10-CM | POA: Diagnosis not present

## 2019-04-05 DIAGNOSIS — I10 Essential (primary) hypertension: Secondary | ICD-10-CM | POA: Diagnosis not present

## 2019-04-05 DIAGNOSIS — E785 Hyperlipidemia, unspecified: Secondary | ICD-10-CM | POA: Diagnosis not present

## 2019-04-05 DIAGNOSIS — E1140 Type 2 diabetes mellitus with diabetic neuropathy, unspecified: Secondary | ICD-10-CM | POA: Diagnosis not present

## 2019-04-05 DIAGNOSIS — L299 Pruritus, unspecified: Secondary | ICD-10-CM | POA: Diagnosis not present

## 2019-11-25 ENCOUNTER — Encounter (HOSPITAL_COMMUNITY): Payer: Self-pay

## 2019-11-25 ENCOUNTER — Emergency Department (HOSPITAL_COMMUNITY)
Admission: EM | Admit: 2019-11-25 | Discharge: 2019-11-25 | Disposition: A | Discharge status: Home / Self Care | Payer: Medicare HMO | Attending: Emergency Medicine | Admitting: Emergency Medicine

## 2019-11-25 ENCOUNTER — Other Ambulatory Visit: Payer: Self-pay

## 2019-11-25 ENCOUNTER — Emergency Department (HOSPITAL_COMMUNITY): Payer: Medicare HMO

## 2019-11-25 DIAGNOSIS — Z5321 Procedure and treatment not carried out due to patient leaving prior to being seen by health care provider: Secondary | ICD-10-CM | POA: Diagnosis not present

## 2019-11-25 DIAGNOSIS — R42 Dizziness and giddiness: Secondary | ICD-10-CM | POA: Insufficient documentation

## 2019-11-25 NOTE — ED Triage Notes (Signed)
Pt sts shob and dizziness for several days. Called a triage nurse and they instructed her to the er for evaluation.

## 2019-11-25 NOTE — ED Notes (Signed)
Pt gave labels and bp cuff to registration and left

## 2020-01-05 ENCOUNTER — Other Ambulatory Visit (HOSPITAL_COMMUNITY): Payer: Self-pay | Admitting: Family Medicine

## 2020-01-05 DIAGNOSIS — R42 Dizziness and giddiness: Secondary | ICD-10-CM

## 2020-01-05 DIAGNOSIS — R0600 Dyspnea, unspecified: Secondary | ICD-10-CM

## 2020-01-11 ENCOUNTER — Telehealth (HOSPITAL_COMMUNITY): Payer: Self-pay | Admitting: Radiology

## 2020-01-11 NOTE — Telephone Encounter (Signed)
Returned call to schedule echocardiogram. Left message on ans. machine

## 2020-02-01 ENCOUNTER — Ambulatory Visit (HOSPITAL_COMMUNITY): Payer: Medicare HMO | Attending: Internal Medicine

## 2020-02-01 ENCOUNTER — Other Ambulatory Visit: Payer: Self-pay

## 2020-02-01 DIAGNOSIS — R0600 Dyspnea, unspecified: Secondary | ICD-10-CM

## 2020-02-01 DIAGNOSIS — R42 Dizziness and giddiness: Secondary | ICD-10-CM | POA: Diagnosis not present

## 2020-02-01 LAB — ECHOCARDIOGRAM COMPLETE
Area-P 1/2: 2.24 cm2
S' Lateral: 2.1 cm

## 2020-02-06 ENCOUNTER — Institutional Professional Consult (permissible substitution): Payer: Medicare HMO | Admitting: Internal Medicine

## 2020-02-06 ENCOUNTER — Institutional Professional Consult (permissible substitution): Payer: Medicare HMO | Admitting: Pulmonary Disease

## 2020-02-06 NOTE — Progress Notes (Deleted)
02/06/20- 60 yoF never smoker [place] sleep evaluation courtesy of Dr Darlina Sicilian. Medical problem list includes OSA, HTN, GERD, DM2/ neuropathy, Hyperlipidemia, Depression Body weight today Epworth score Covid vax- Flu vax-

## 2020-02-23 ENCOUNTER — Institutional Professional Consult (permissible substitution): Payer: Medicare HMO | Admitting: Internal Medicine

## 2020-03-17 ENCOUNTER — Ambulatory Visit: Payer: Medicare HMO | Attending: Internal Medicine

## 2020-03-17 DIAGNOSIS — Z23 Encounter for immunization: Secondary | ICD-10-CM

## 2020-03-17 NOTE — Progress Notes (Signed)
   Covid-19 Vaccination Clinic  Name:  BEVERLIE KURIHARA    MRN: [id number] DOB: [birth year]  03/17/2020  Ms. Blalock was observed post Covid-19 immunization for 15 minutes without incident. She was provided with Vaccine Information Sheet and instruction to access the V-Safe system.   Ms. Ashmore was instructed to call 911 with any severe reactions post vaccine: Marland Kitchen Difficulty breathing  . Swelling of face and throat  . A fast heartbeat  . A bad rash all over body  . Dizziness and weakness

## 2020-03-26 ENCOUNTER — Encounter: Payer: Self-pay | Admitting: Pulmonary Disease

## 2020-03-26 ENCOUNTER — Ambulatory Visit: Payer: Medicare HMO | Admitting: Pulmonary Disease

## 2020-03-26 ENCOUNTER — Other Ambulatory Visit: Payer: Self-pay

## 2020-03-26 DIAGNOSIS — G4721 Circadian rhythm sleep disorder, delayed sleep phase type: Secondary | ICD-10-CM

## 2020-03-26 DIAGNOSIS — G4733 Obstructive sleep apnea (adult) (pediatric): Secondary | ICD-10-CM | POA: Diagnosis not present

## 2020-03-26 NOTE — Assessment & Plan Note (Signed)
Her sleep habits have been ingrained for so many years that it will be difficult to phase shift her.  I asked her to have realistic expectations with phase shifting. Feel that her delayed sleep phase has been misinterpreted as insomnia   Take 10 mg of melatonin around midnight. Okay to take Ambien 30 minutes before scheduled bedtime. Try to schedule earlier bedtime by 30 minutes starting at 3:30 AM  Wake up with an alarm clock after 8 hours-starting at noon  Light exposure for 30 minutes after waking up  Try to shift for 30 minutes every 1 to 2 weeks until you can get to a scheduled  bedtime for around 1 AM

## 2020-03-26 NOTE — Assessment & Plan Note (Signed)
Given the delayed sleep phase, it would be difficult to have an attended polysomnogram. Best to do a home sleep test to reassess OSA that was diagnosed few years ago.  We would only consider therapy if this is at least a moderate degree She has not tolerated CPAP in the past

## 2020-03-26 NOTE — Patient Instructions (Signed)
  Schedule home sleep test to check for obstructive sleep apnea.  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 Take 10 mg of melatonin around midnight. Okay to take Ambien 30 minutes before scheduled bedtime. Try to schedule earlier bedtime by 30 minutes starting at 3:30 AM  Wake up with an alarm clock after 8 hours-starting at noon  Light exposure for 30 minutes after waking up  Try to shift for 30 minutes every 1 to 2 weeks until you can get to a scheduled  bedtime for around 1 AM

## 2020-03-26 NOTE — Addendum Note (Signed)
Addended by: Gavin Potters R on: 03/26/2020 03:53 PM   Modules accepted: Orders

## 2020-03-26 NOTE — Progress Notes (Signed)
Subjective:    Patient ID: Mindy Richard, female    DOB: 25-Feb-1956, 64 y.o.   MRN: [id number]  HPI   Chief Complaint  Patient presents with  . Consult    not using cyrrently using CPAP, last sleep study over 27 years ago    64 year old presents for evaluation of sleep disordered breathing and reassessment of OSA. OSA was diagnosed more than 10 years ago, she was placed on CPAP for about 2 years with nasal pillows but she could not settle down with this.  She reports that she "hated it" and finally abandoned using the machine. Husband sleeps in a different bedroom which is due to different sleep habits, he is now retired and stays up with her until 2 AM before he goes off to bed. Bedtime for her can be as late as 4 AM, sleep latency about 30 minutes.  She uses Ambien for many years and has gotten used to this, and psychiatrist gave her Flexeril which she uses around 4 AM.  She reads in bed until she falls asleep in 30 minutes. She sleeps on the left side and has a pillow.  So that her face is buried in the pillow, reports 1-2 nocturnal awakenings and is out of bed by 2  p.m. feeling tired with occasional headaches. She has dryness of mouth all day. Her weight has fluctuated within 5 pounds. She worked until 20 years ago and then she would try to get a job which did not need her to show up early in the morning  PMH -diabetes, hyperlipidemia, hypertension  Reviewed PCP notes, she was given doxepin for insomnia which she did not tolerate  Past Medical History:  Diagnosis Date  . Anxiety   . Chest pain 04/2011   saw Dr Ron Parker..not heart related  sent to GI dr. and found gallstones.  . Complication of anesthesia 2001; 20012   "slow to wake up"  . Depression   . GERD (gastroesophageal reflux disease)   . H/O hiatal hernia   . Hypercholesteremia   . Hypertension   . Mental disorder   . Renal disorder   . Type II diabetes mellitus ([place]) 2003    Past Surgical History:  Procedure  Laterality Date  . CARPAL TUNNEL RELEASE  2001   right  . CARPAL TUNNEL RELEASE Left 2017  . CESAREAN SECTION  1985  . CHOLECYSTECTOMY  09/02/11  . CHOLECYSTECTOMY  09/02/2011   Procedure: LAPAROSCOPIC CHOLECYSTECTOMY WITH INTRAOPERATIVE CHOLANGIOGRAM;  Surgeon: Haywood Lasso, MD;  Location: [facility];  Service: General;  Laterality: N/A;  . COLONOSCOPY    . DILATION AND CURETTAGE OF UTERUS  1984  . ERCP  09/03/2011   Procedure: ENDOSCOPIC RETROGRADE CHOLANGIOPANCREATOGRAPHY (ERCP);  Surgeon: Missy Sabins, MD;  Location[facility] ENDOSCOPY;  Service: Endoscopy;  Laterality: N/A;  sphincterotomy and stone extraction  . SHOULDER ARTHROSCOPY W/ ROTATOR CUFF REPAIR  2012   left  . TONSILLECTOMY  ~ 1970  . TUBAL LIGATION  1985?    Allergies  Allergen Reactions  . Codeine Palpitations and Other (See Comments)    Makes her head feel "weird"  . Glucophage [Metformin Hcl] Diarrhea  . Nsaids Other (See Comments)    "upset stomach"    Social History   Socioeconomic History  . Marital status: Married    Spouse name: Not on file  . Number of children: 1  . Years of education: Not on file  . Highest education level: Not on file  Occupational  History  . Occupation: Housewife  Tobacco Use  . Smoking status: Never Smoker  . Smokeless tobacco: Never Used  Vaping Use  . Vaping Use: Never used  Substance and Sexual Activity  . Alcohol use: No  . Drug use: No  . Sexual activity: Not Currently  Other Topics Concern  . Not on file  Social History Narrative  . Not on file   Social Determinants of Health   Financial Resource Strain:   . Difficulty of Paying Living Expenses: Not on file  Food Insecurity:   . Worried About Charity fundraiser in the Last Year: Not on file  . Ran Out of Food in the Last Year: Not on file  Transportation Needs:   . Lack of Transportation (Medical): Not on file  . Lack of Transportation (Non-Medical): Not on file  Physical Activity:   . Days of Exercise per  Week: Not on file  . Minutes of Exercise per Session: Not on file  Stress:   . Feeling of Stress : Not on file  Social Connections:   . Frequency of Communication with Friends and Family: Not on file  . Frequency of Social Gatherings with Friends and Family: Not on file  . Attends Religious Services: Not on file  . Active Member of Clubs or Organizations: Not on file  . Attends Archivist Meetings: Not on file  . Marital Status: Not on file  Intimate Partner Violence:   . Fear of Current or Ex-Partner: Not on file  . Emotionally Abused: Not on file  . Physically Abused: Not on file  . Sexually Abused: Not on file     Family History  Problem Relation Age of Onset  . Cancer Maternal Aunt        breast  . Heart attack Father 22       Smoker  . Diabetes Mother   . Hyperlipidemia Mother   . Anesthesia problems Neg Hx   . Hypotension Neg Hx      Review of Systems Constitutional: negative for anorexia, fevers and sweats  Eyes: negative for irritation, redness and visual disturbance  Ears, nose, mouth, throat, and face: negative for earaches, epistaxis, nasal congestion and sore throat  Respiratory: negative for cough, dyspnea on exertion, sputum and wheezing  Cardiovascular: negative for chest pain, dyspnea, lower extremity edema, orthopnea, palpitations and syncope  Gastrointestinal: negative for abdominal pain, constipation, diarrhea, melena, nausea and vomiting  Genitourinary:negative for dysuria, frequency and hematuria  Hematologic/lymphatic: negative for bleeding, easy bruising and lymphadenopathy  Musculoskeletal:negative for arthralgias, muscle weakness and stiff joints  Neurological: negative for coordination problems, gait problems, headaches and weakness  Endocrine: negative for diabetic symptoms including polydipsia, polyuria and weight loss     Objective:   Physical Exam  Gen. Pleasant, well-nourished, in no distress, normal affect ENT - no  pallor,icterus, no post nasal drip, class 2 airway Neck: No JVD, no thyromegaly, no carotid bruits Lungs: no use of accessory muscles, no dullness to percussion, clear without rales or rhonchi  Cardiovascular: Rhythm regular, heart sounds  normal, no murmurs or gallops, no peripheral edema Abdomen: soft and non-tender, no hepatosplenomegaly, BS normal. Musculoskeletal: No deformities, no cyanosis or clubbing Neuro:  alert, non focal       Assessment & Plan:

## 2020-04-24 ENCOUNTER — Other Ambulatory Visit: Payer: Self-pay

## 2020-04-24 ENCOUNTER — Ambulatory Visit: Payer: Medicare HMO

## 2020-04-24 DIAGNOSIS — G4733 Obstructive sleep apnea (adult) (pediatric): Secondary | ICD-10-CM

## 2020-04-24 DIAGNOSIS — G4721 Circadian rhythm sleep disorder, delayed sleep phase type: Secondary | ICD-10-CM

## 2020-04-25 ENCOUNTER — Telehealth: Payer: Self-pay | Admitting: Pulmonary Disease

## 2020-04-25 DIAGNOSIS — G4733 Obstructive sleep apnea (adult) (pediatric): Secondary | ICD-10-CM

## 2020-04-25 NOTE — Telephone Encounter (Signed)
HST showed very mild OSA with AHI 7/ hr No treatment at this time necessary Office visit in 6 months with APP

## 2020-04-27 NOTE — Telephone Encounter (Signed)
Called and left message on voicemail to please return phone call. Contact number provided. 

## 2020-04-30 NOTE — Telephone Encounter (Signed)
Called and left message on voicemail to please return phone call. Contact number provided. 

## 2020-05-01 ENCOUNTER — Telehealth: Payer: Self-pay | Admitting: Pulmonary Disease

## 2020-05-01 NOTE — Telephone Encounter (Signed)
Rigoberto Noel, MD  Note HST showed very mild OSA with AHI 7/ hr No treatment at this time necessary Office visit in 6 months with APP     Called and spoke with pt letting her know the results of HST per RA and she verbalized understanding. appt has been scheduled for pt with Aaron Edelman for a 6 month follow up. Nothing further needed.

## 2020-10-29 ENCOUNTER — Telehealth: Payer: Self-pay

## 2020-10-29 NOTE — Telephone Encounter (Signed)
LVM in regards to pt if she was using a CPAP machine or not. Gave a call back number.

## 2020-10-30 ENCOUNTER — Ambulatory Visit (INDEPENDENT_AMBULATORY_CARE_PROVIDER_SITE_OTHER): Payer: Medicare HMO | Admitting: Primary Care

## 2020-10-30 ENCOUNTER — Other Ambulatory Visit: Payer: Self-pay

## 2020-10-30 ENCOUNTER — Ambulatory Visit: Payer: Medicare HMO | Admitting: Pulmonary Disease

## 2020-10-30 ENCOUNTER — Encounter: Payer: Self-pay | Admitting: Primary Care

## 2020-10-30 DIAGNOSIS — G4721 Circadian rhythm sleep disorder, delayed sleep phase type: Secondary | ICD-10-CM

## 2020-10-30 DIAGNOSIS — G4733 Obstructive sleep apnea (adult) (pediatric): Secondary | ICD-10-CM | POA: Diagnosis not present

## 2020-10-30 NOTE — Progress Notes (Signed)
$'@Patient'c$  ID: Mindy Richard, female    DOB: May 24, 1955, 65 y.o.   MRN: [id number]  Chief Complaint  Patient presents with   Follow-up    Insomnia-    Referring provider: Buzzy Han*  HPI: 65 year old female, never smoked.  Past medical history significant for hypertension, OSA, circadian rhythm sleep disorder, delayed sleep phase, GERD.  Patient of Dr. Elsworth Soho, seen for initial sleep consult on 03/26/2020.     10/30/2020 Patient presents today for 6 month follow-up. During last visit, Dr. Elsworth Soho felt hat her delayed sleep phase has been misinterpreted as insomnia.  He advised that she take 10 mg of melatonin around midnight and Ambien 30 minutes before scheduled bedtime.  He also encourage her to try shifting bedtime earlier by 30 minutes every 1 to 2 weeks with goal bedtime around 1 AM. It was also recommended she get early morning light exposure for 30 minutes.  She feels her sleep has been some better, she reports going to bed earlier. She is going to bed at 3am and gets out of bed at 2am. States she was going to bed much later than this. She is depressed. Psychatrist started her on mirtazapine which has been maintain sleep better. Ambien has been cut down to $Remov'5mg'CNsmGi$ . She is still taking melatonin. She is tired and ready to make some changes in her sleep habits.   HST on 04/25/20 showed very mild OSA, AHI 7/hr. No treatment was necessary at that time. No witnessed apneic events. Her husband and her sleep in separate rooms. He has a similar sleep schedule.     Allergies  Allergen Reactions   Codeine Palpitations and Other (See Comments)    Makes her head feel "weird"   Glucophage [Metformin Hcl] Diarrhea   Nsaids Other (See Comments)    "upset stomach"    Immunization History  Administered Date(s) Administered   DT (Pediatric) 08/10/2013   Influenza Inj Mdck Quad With Preservative 02/17/2017   Influenza Split 04/04/2013, 04/03/2014, 03/08/2015   Influenza,inj,quad, With  Preservative 03/25/2016   Influenza-Unspecified 02/01/2020   PFIZER(Purple Top)SARS-COV-2 Vaccination 03/17/2020   Pneumococcal-Unspecified 01/17/2006    Past Medical History:  Diagnosis Date   Anxiety    Chest pain 04/2011   saw Dr Ron Parker..not heart related  sent to GI dr. and found gallstones.   Complication of anesthesia 2001; 20012   "slow to wake up"   Depression    GERD (gastroesophageal reflux disease)    H/O hiatal hernia    Hypercholesteremia    Hypertension    Mental disorder    Renal disorder    Type II diabetes mellitus ([place]) 2003    Tobacco History: Social History   Tobacco Use  Smoking Status Never  Smokeless Tobacco Never   Counseling given: Not Answered   Outpatient Medications Prior to Visit  Medication Sig Dispense Refill   acetaminophen (TYLENOL) 500 MG tablet Take 500-1,000 mg by mouth every 4 (four) hours as needed for mild pain, moderate pain, fever or headache. For pain      blood glucose meter kit and supplies Check sugar 3 times a day. DxE11.9Dispense based insurance preference. 1 each 0   buPROPion (WELLBUTRIN XL) 300 MG 24 hr tablet Take 300 mg by mouth daily.      Cholecalciferol (VITAMIN D) 2000 units tablet Take 2,000 Units by mouth daily with breakfast.     diclofenac sodium (VOLTAREN) 1 % GEL Apply 2 g topically 4 (four) times daily. (Patient taking differently: Apply 2 g  topically 4 (four) times daily as needed (for pain).) 100 g 2   DROPLET PEN NEEDLES 31G X 8 MM MISC USE AS DIRECTED  WITH  LANTUS  INJECTIONS 100 each 3   glucose blood test strip One touch Test blood sugar TID PRN DX insulin dependent diabetic E11.9 100 each 12   hydrocortisone (PROCTO-PAK) 1 % CREA Apply once or twice daily as needed for hemorrhoids 28.35 g 3   LANTUS SOLOSTAR 100 UNIT/ML Solostar Pen INJECT 8 UNITS SUBCUTANEOUSLY EVERY DAY AS DIRECTED OR AS DIRECTED BY PRIMARY CARE PHYSICIAN DISCARD PEN 28 DAYS AFTER OPENING 15 mL 2   losartan (COZAAR) 100 MG tablet TAKE  1/2 TO 1 TABLET DAILY AS DIRECTED FOR BLOOD PRESSURE AND KIDNEY PROTECTION (Patient taking differently: Take 50-100 mg by mouth daily.) 90 tablet 4   meloxicam (MOBIC) 7.5 MG tablet Take 1 tablet (7.5 mg total) by mouth 2 (two) times daily as needed for pain. 60 tablet 0   mirtazapine (REMERON) 15 MG tablet Take 15 mg by mouth at bedtime.     omeprazole (PRILOSEC) 40 MG capsule TAKE 1 CAPSULE EVERY DAY 90 capsule 1   Saxagliptin-Metformin (KOMBIGLYZE XR) 2.09-998 MG TB24 TAKE 1 TABLET TWICE DAILY WITH A MEAL 30 tablet 0   triamcinolone ointment (KENALOG) 0.1 % Apply 1 application topically 2 (two) times daily. 80 g 1   venlafaxine XR (EFFEXOR-XR) 150 MG 24 hr capsule Take 150 mg by mouth daily with breakfast.      zolpidem (AMBIEN) 10 MG tablet Take 10 mg by mouth at bedtime.      atorvastatin (LIPITOR) 40 MG tablet Take 1 tablet (40 mg total) by mouth daily. 90 tablet 2   gabapentin (NEURONTIN) 100 MG capsule Take 1 capsule (100 mg total) by mouth 3 (three) times daily. (Patient not taking: Reported on 12/29/2017) 90 capsule 2   No facility-administered medications prior to visit.      Review of Systems  Review of Systems  Constitutional: Negative.  Negative for fatigue.  Respiratory: Negative.    Cardiovascular: Negative.   Psychiatric/Behavioral:  Positive for sleep disturbance. Negative for agitation and self-injury. The patient is not nervous/anxious and is not hyperactive.        Depression    Physical Exam  BP 108/64 (BP Location: Left Arm, Patient Position: Sitting, Cuff Size: Normal)   Pulse (!) 101   Temp 98.6 F (37 C) (Temporal)   Ht $R'5\' 3"'Yv$  (1.6 m)   Wt 139 lb (63 kg)   SpO2 98%   BMI 24.62 kg/m  Physical Exam Constitutional:      Appearance: Normal appearance.  HENT:     Head: Normocephalic and atraumatic.     Mouth/Throat:     Mouth: Mucous membranes are moist.     Pharynx: Oropharynx is clear.  Cardiovascular:     Rate and Rhythm: Normal rate and regular  rhythm.  Pulmonary:     Effort: Pulmonary effort is normal.     Breath sounds: Normal breath sounds.  Skin:    General: Skin is warm and dry.  Neurological:     General: No focal deficit present.     Mental Status: She is alert and oriented to person, place, and time. Mental status is at baseline.  Psychiatric:        Mood and Affect: Mood normal.        Behavior: Behavior normal.        Thought Content: Thought content normal.  Judgment: Judgment normal.     Lab Results:  CBC    Component Value Date/Time   WBC 11.7 (H) 05/24/2018 1159   RBC 4.30 05/24/2018 1159   HGB 12.0 05/24/2018 1159   HCT 37.5 05/24/2018 1159   PLT 373 05/24/2018 1159   MCV 87.2 05/24/2018 1159   MCH 27.9 05/24/2018 1159   MCHC 32.0 05/24/2018 1159   RDW 13.9 05/24/2018 1159   LYMPHSABS 2,697 12/29/2017 1632   MONOABS 644 11/12/2016 1606   EOSABS 407 12/29/2017 1632   BASOSABS 58 12/29/2017 1632    BMET    Component Value Date/Time   NA 140 05/24/2018 1159   K 3.7 05/24/2018 1159   CL 106 05/24/2018 1159   CO2 21 (L) 05/24/2018 1159   GLUCOSE 224 (H) 05/24/2018 1159   BUN 10 05/24/2018 1159   CREATININE 0.71 05/24/2018 1159   CREATININE 0.73 12/29/2017 1632   CALCIUM 9.0 05/24/2018 1159   GFRNONAA >60 05/24/2018 1159   GFRNONAA 89 12/29/2017 1632   GFRAA >60 05/24/2018 1159   GFRAA 103 12/29/2017 1632    BNP No results found for: BNP  ProBNP No results found for: PROBNP  Imaging: No results found.   Assessment & Plan:   OSA (obstructive sleep apnea) - Sleep study showed mild OSA, no indication for CPAP at this time  Delayed sleep phase syndrome - Sleep is some better, she reports going to bed earlier. She is ready to make changes to her sleep habits - Encourage patient shift bedtime 30 minutes earlier every 1-2 weeks. We have set a short term goal for 2:00-2:30am and encourage she get out of bed 12:00 -1:00 pm that same day.  - Advised she aim to get between 8-10  hours sleep  - Continue Melatonin 10mg  at midnight and Ambien 5mg  70min prior to bedtime  - Look into behavioral health with [facility], let us know if you need referral   Follow-up: - 6 months with Dr. Candise Che, NP 12/03/2020

## 2020-10-30 NOTE — Patient Instructions (Addendum)
Recommendations: - Sleep study showed mild OSA, no indication for CPAP at this time -Try shifting bedtime earlier by 30 minutes every 1 to 2 weeks - Short term goal for bedtime is 2-2:30am. Aim to get between 8-10 hours sleep. Get out of bed between 12-1pm.  - Continue Melatonin 10mg  at midnight and Ambien 5mg  30min prior to bedtime  - Look into behavioral health with [facility], let us know if you need referral  - Think about some hobbies or passions of yours  Follow-up: - 6 months with Dr. Elsworth Soho

## 2020-11-14 IMAGING — CR DG CHEST 2V
2 series · 2 of 2 positions shown · non-contrast
Comparison: 12/17/2015

CLINICAL DATA: Shortness of breath

EXAM:
CHEST - 2 VIEW

[w chest pa]
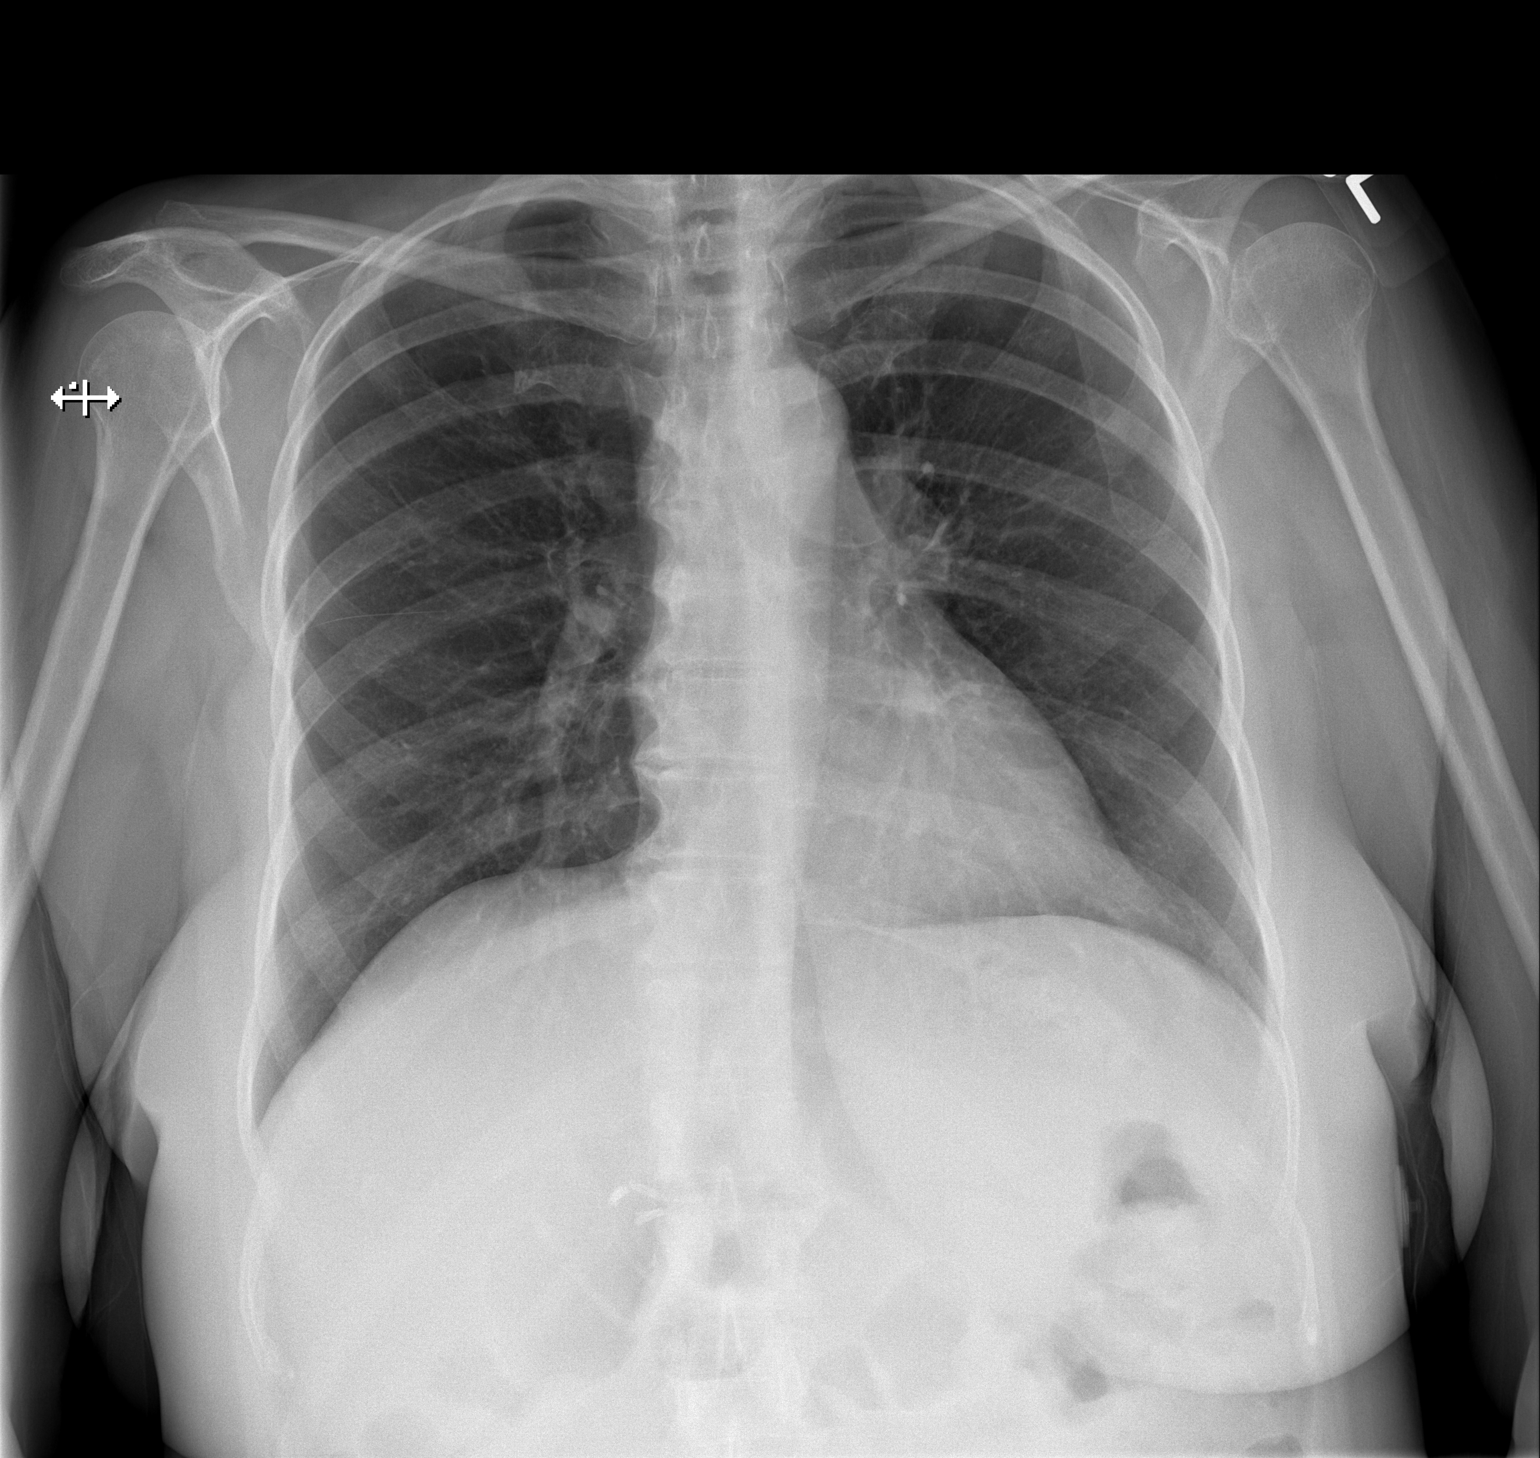

[w chest lat]
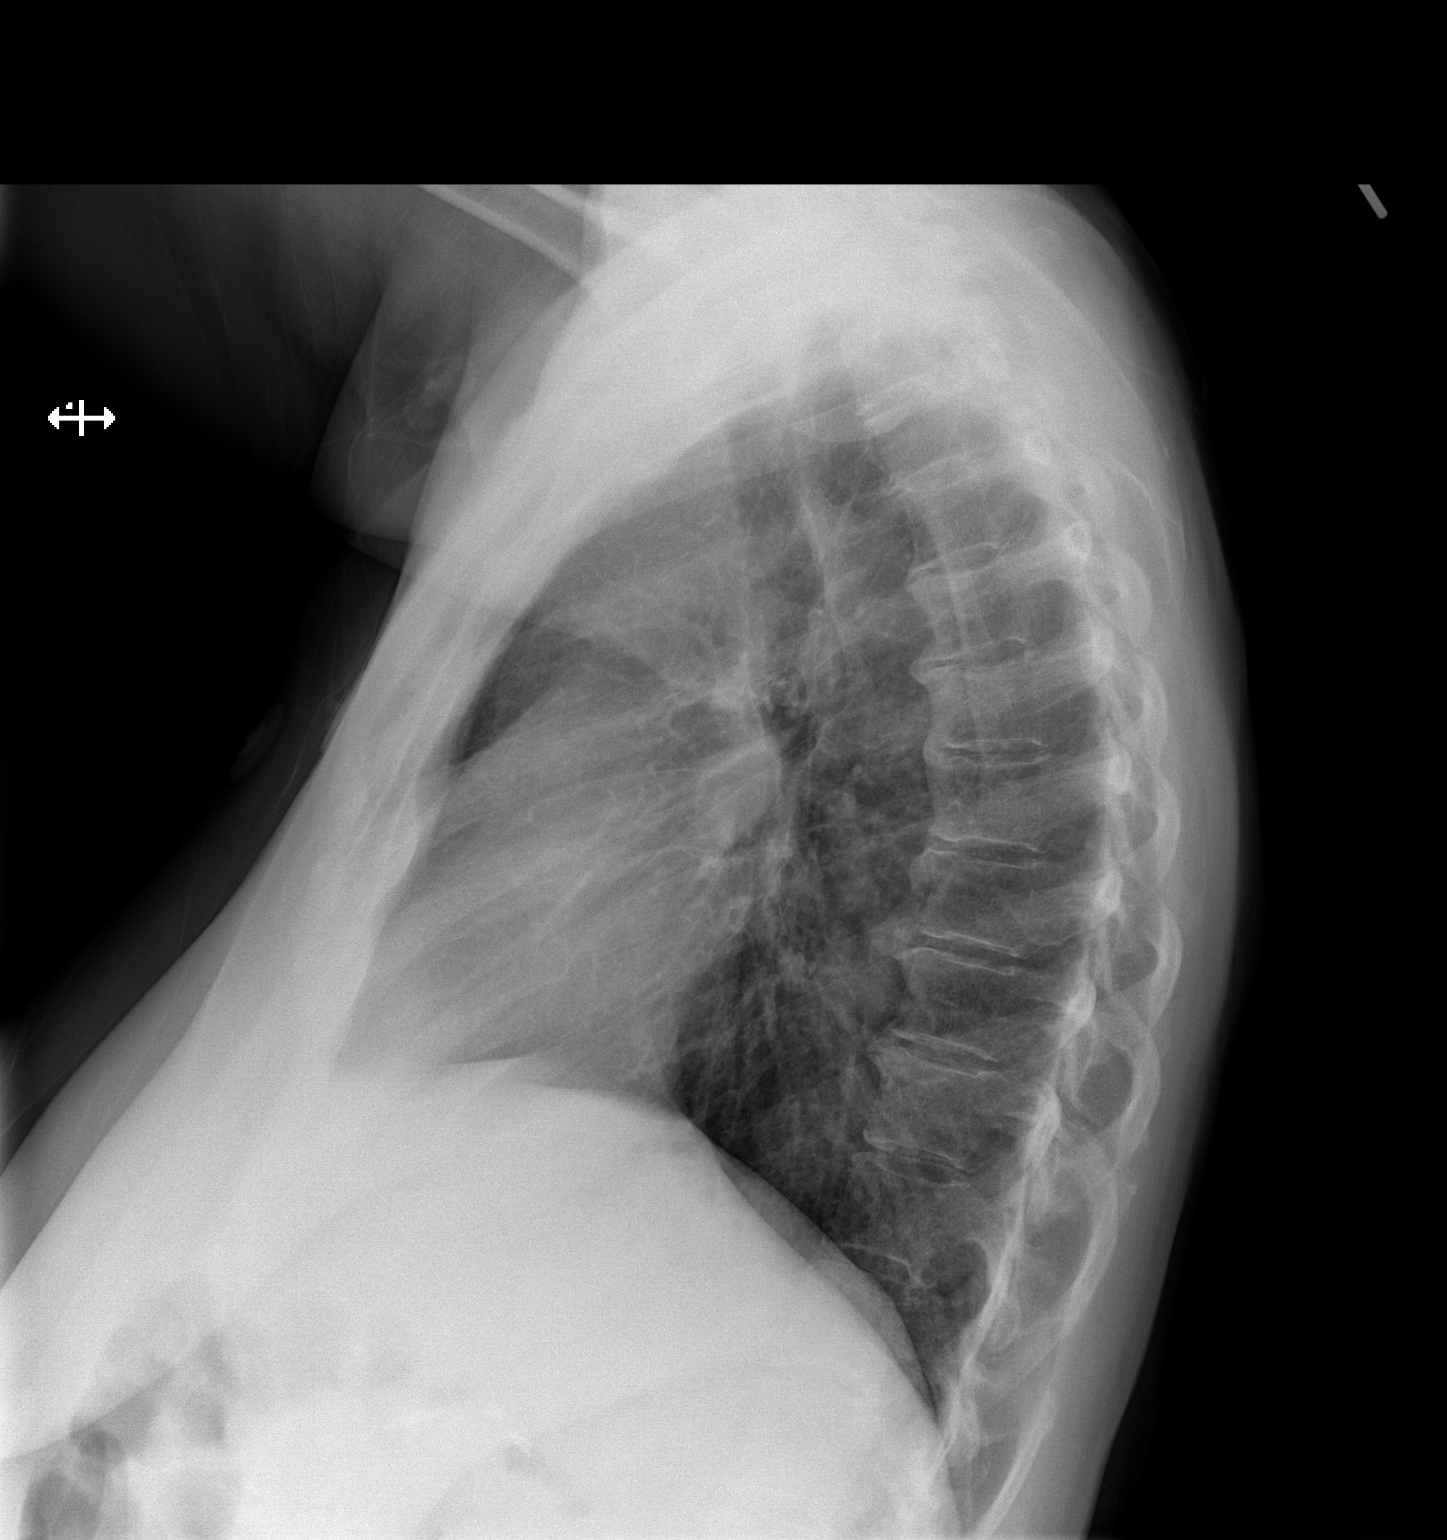

[2 of 2 positions shown; findings below may reference images not displayed]

FINDINGS: The cardiomediastinal contours are normal. The lungs are clear.
Pulmonary vasculature is normal. No consolidation, pleural effusion,
or pneumothorax. No acute osseous abnormalities are seen.
IMPRESSION: No acute chest findings.

## 2020-12-03 NOTE — Assessment & Plan Note (Signed)
-   Sleep study showed mild OSA, no indication for CPAP at this time

## 2020-12-03 NOTE — Assessment & Plan Note (Addendum)
-   Sleep is some better, she reports going to bed earlier. She is ready to make changes to her sleep habits - Encourage patient shift bedtime 30 minutes earlier every 1-2 weeks. We have set a short term goal for 2:00-2:30am and encourage she get out of bed 12:00 -1:00 pm that same day.  - Advised she aim to get between 8-10 hours sleep  - Continue Melatonin 10mg  at midnight and Ambien 5mg  35min prior to bedtime  - Look into behavioral health with [facility], let us know if you need referral   Follow-up: - 6 months with Dr. Elsworth Soho

## 2021-04-09 ENCOUNTER — Other Ambulatory Visit: Payer: Self-pay | Admitting: Family Medicine

## 2021-04-09 DIAGNOSIS — R1012 Left upper quadrant pain: Secondary | ICD-10-CM

## 2021-04-30 ENCOUNTER — Emergency Department (HOSPITAL_BASED_OUTPATIENT_CLINIC_OR_DEPARTMENT_OTHER)
Admission: EM | Admit: 2021-04-30 | Discharge: 2021-04-30 | Disposition: A | Discharge status: Home / Self Care | Payer: Medicare HMO | Attending: Emergency Medicine | Admitting: Emergency Medicine

## 2021-04-30 ENCOUNTER — Other Ambulatory Visit: Payer: Medicare HMO

## 2021-04-30 ENCOUNTER — Encounter (HOSPITAL_BASED_OUTPATIENT_CLINIC_OR_DEPARTMENT_OTHER): Payer: Self-pay | Admitting: Emergency Medicine

## 2021-04-30 ENCOUNTER — Other Ambulatory Visit: Payer: Self-pay

## 2021-04-30 ENCOUNTER — Emergency Department (HOSPITAL_BASED_OUTPATIENT_CLINIC_OR_DEPARTMENT_OTHER): Payer: Medicare HMO

## 2021-04-30 DIAGNOSIS — I10 Essential (primary) hypertension: Secondary | ICD-10-CM | POA: Insufficient documentation

## 2021-04-30 DIAGNOSIS — D508 Other iron deficiency anemias: Secondary | ICD-10-CM | POA: Insufficient documentation

## 2021-04-30 DIAGNOSIS — Z794 Long term (current) use of insulin: Secondary | ICD-10-CM | POA: Insufficient documentation

## 2021-04-30 DIAGNOSIS — N200 Calculus of kidney: Secondary | ICD-10-CM | POA: Diagnosis not present

## 2021-04-30 DIAGNOSIS — Z79899 Other long term (current) drug therapy: Secondary | ICD-10-CM | POA: Insufficient documentation

## 2021-04-30 DIAGNOSIS — D75839 Thrombocytosis, unspecified: Secondary | ICD-10-CM | POA: Insufficient documentation

## 2021-04-30 DIAGNOSIS — R748 Abnormal levels of other serum enzymes: Secondary | ICD-10-CM | POA: Diagnosis not present

## 2021-04-30 DIAGNOSIS — D72829 Elevated white blood cell count, unspecified: Secondary | ICD-10-CM | POA: Diagnosis not present

## 2021-04-30 DIAGNOSIS — E119 Type 2 diabetes mellitus without complications: Secondary | ICD-10-CM | POA: Insufficient documentation

## 2021-04-30 DIAGNOSIS — K219 Gastro-esophageal reflux disease without esophagitis: Secondary | ICD-10-CM | POA: Insufficient documentation

## 2021-04-30 DIAGNOSIS — R1084 Generalized abdominal pain: Secondary | ICD-10-CM | POA: Diagnosis present

## 2021-04-30 LAB — COMPREHENSIVE METABOLIC PANEL
ALT: 23 U/L (ref 0–44)
AST: 31 U/L (ref 15–41)
Albumin: 3.8 g/dL (ref 3.5–5.0)
Alkaline Phosphatase: 131 U/L — ABNORMAL HIGH (ref 38–126)
Anion gap: 12 (ref 5–15)
BUN: 12 mg/dL (ref 8–23)
CO2: 21 mmol/L — ABNORMAL LOW (ref 22–32)
Calcium: 9.4 mg/dL (ref 8.9–10.3)
Chloride: 102 mmol/L (ref 98–111)
Creatinine, Ser: 0.73 mg/dL (ref 0.44–1.00)
GFR, Estimated: >60 mL/min (ref >60)
Glucose, Bld: 274 mg/dL — ABNORMAL HIGH (ref 70–99)
Potassium: 3.5 mmol/L (ref 3.5–5.1)
Sodium: 135 mmol/L (ref 135–145)
Total Bilirubin: 0.3 mg/dL (ref 0.3–1.2)
Total Protein: 7.3 g/dL (ref 6.5–8.1)

## 2021-04-30 LAB — URINALYSIS, MICROSCOPIC (REFLEX): RBC / HPF: >50 RBC/hpf (ref 0–5)

## 2021-04-30 LAB — CBC WITH DIFFERENTIAL/PLATELET
Abs Immature Granulocytes: 0.08 10*3/uL — ABNORMAL HIGH (ref 0.00–0.07)
Basophils Absolute: 0.1 10*3/uL (ref 0.0–0.1)
Basophils Relative: 0 %
Eosinophils Absolute: 0.2 10*3/uL (ref 0.0–0.5)
Eosinophils Relative: 1 %
HCT: 33.4 % — ABNORMAL LOW (ref 36.0–46.0)
Hemoglobin: 10.9 g/dL — ABNORMAL LOW (ref 12.0–15.0)
Immature Granulocytes: 1 %
Lymphocytes Relative: 14 %
Lymphs Abs: 2.3 10*3/uL (ref 0.7–4.0)
MCH: 26.6 pg (ref 26.0–34.0)
MCHC: 32.6 g/dL (ref 30.0–36.0)
MCV: 81.5 fL (ref 80.0–100.0)
Monocytes Absolute: 0.9 10*3/uL (ref 0.1–1.0)
Monocytes Relative: 5 %
Neutro Abs: 13 10*3/uL — ABNORMAL HIGH (ref 1.7–7.7)
Neutrophils Relative %: 79 %
Platelets: 424 10*3/uL — ABNORMAL HIGH (ref 150–400)
RBC: 4.1 MIL/uL (ref 3.87–5.11)
RDW: 14.7 % (ref 11.5–15.5)
WBC: 16.6 10*3/uL — ABNORMAL HIGH (ref 4.0–10.5)
nRBC: 0 % (ref 0.0–0.2)

## 2021-04-30 LAB — URINALYSIS, ROUTINE W REFLEX MICROSCOPIC
Bilirubin Urine: NEGATIVE
Glucose, UA: 100 mg/dL — AB
Ketones, ur: 15 mg/dL — AB
Leukocytes,Ua: NEGATIVE
Nitrite: NEGATIVE
Protein, ur: 30 mg/dL — AB
Specific Gravity, Urine: 1.02 (ref 1.005–1.030)
pH: 5 (ref 5.0–8.0)

## 2021-04-30 LAB — LIPASE, BLOOD: Lipase: 43 U/L (ref 11–51)

## 2021-04-30 MED ORDER — ONDANSETRON HCL 4 MG PO TABS: 4.0000 mg | ORAL_TABLET | Freq: Three times a day (TID) | ORAL | 0 refills | Status: AC | PRN

## 2021-04-30 MED ORDER — OXYCODONE-ACETAMINOPHEN 5-325 MG PO TABS
1.0000 | ORAL_TABLET | Freq: Once | ORAL | Status: AC
  Administered 2021-04-30: 1 via ORAL
  Filled 2021-04-30: qty 1

## 2021-04-30 MED ORDER — TAMSULOSIN HCL 0.4 MG PO CAPS: 0.4000 mg | ORAL_CAPSULE | Freq: Every day | ORAL | 0 refills | Status: DC

## 2021-04-30 MED ORDER — ONDANSETRON HCL 4 MG/2ML IJ SOLN
4.0000 mg | Freq: Once | INTRAMUSCULAR | Status: AC
  Administered 2021-04-30: 4 mg via INTRAVENOUS
  Filled 2021-04-30: qty 2

## 2021-04-30 MED ORDER — FENTANYL CITRATE PF 50 MCG/ML IJ SOSY
50.0000 ug | PREFILLED_SYRINGE | Freq: Once | INTRAMUSCULAR | Status: AC
  Administered 2021-04-30: 50 ug via INTRAVENOUS
  Filled 2021-04-30: qty 1

## 2021-04-30 MED ORDER — OXYCODONE-ACETAMINOPHEN 5-325 MG PO TABS: 1.0000 | ORAL_TABLET | ORAL | 0 refills | Status: AC | PRN

## 2021-04-30 MED ORDER — IOHEXOL 300 MG/ML  SOLN
100.0000 mL | Freq: Once | INTRAMUSCULAR | Status: AC | PRN
  Administered 2021-04-30: 100 mL via INTRAVENOUS

## 2021-04-30 NOTE — ED Notes (Addendum)
Pt alert, NAD, calm, interactive, family at [facility], pt to CT at this time. Mentions pain and some nausea. Rates pain 10/10.

## 2021-04-30 NOTE — Discharge Instructions (Addendum)
Return for worsening pain or fevers.  Call to schedule an appoint with urology.  Contact information is listed.

## 2021-04-30 NOTE — ED Provider Notes (Signed)
[place] EMERGENCY DEPARTMENT Provider Note   CSN: [id number] Arrival date & time: 04/30/21  9702    History Chief Complaint  Patient presents with   Abdominal Pain   Mindy Richard is a 65 y.o. female with PMHx anxiety, GERD, HTN, HLD, T2DM who presents to the ED complaining of 4-5 weeks of intermittent left-sided abdominal pain that worsened last night. Patient describes the pain as constant but waxing and waning and states "it is dull, but a bad dull". She has seen her PCP for this and  GI for this recently. She was scheduled for an outpatient CT scan today.  She has tried both Tylenol and Mylanta without relief.  She has had a loss of appetite and some nausea with this pain.  Feels that she has been belching more often and not passing as much gas.  She is previously able to sleep through the pain but last night the pain was "excruciating" and she was unable to find a comfortable position or sleep which prompted ED visit.  States that previously eating helped with the pain but now seems to make it worse.  The only medication change that she notes is that her omeprazole was recently changed to pantoprazole; this gave her headaches at first but that has since resolved.  No vomiting, fever, recent travel, chest pain, shortness of breath, early satiety, bloody stools. She has chronic diarrhea- reports it is a medication side effect from one of her diabetes medications.       Past Medical History:  Diagnosis Date   Anxiety    Chest pain 04/2011   saw Dr Ron Parker..not heart related  sent to GI dr. and found gallstones.   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   Encounter  for long-term (current) use of insulin ([place]) 08/07/2016   OSA (obstructive sleep apnea) 06/04/2015   Neuropathy, diabetic ([place]) 04/16/2015   Medicare annual wellness visit, initial 01/11/2015   Insulin-requiring or dependent type II diabetes mellitus ([place]) 08/10/2013   Vitamin D deficiency 04/03/2013   Medication management 04/03/2013   Hyperlipidemia associated with type 2 diabetes mellitus ([place]) 04/03/2013   Hypertension 05/09/2011   Depression, major, in remission ([place]) 05/09/2011   GERD 05/09/2011    Past Surgical History:  Procedure Laterality Date   CARPAL TUNNEL RELEASE  2001   right   CARPAL TUNNEL RELEASE Left 2017   [place]   CHOLECYSTECTOMY  09/02/11   CHOLECYSTECTOMY  09/02/2011   Procedure: LAPAROSCOPIC CHOLECYSTECTOMY WITH INTRAOPERATIVE CHOLANGIOGRAM;  Surgeon: Haywood Lasso, MD;  Location: [facility];  Service: General;  Laterality: N/A;   COLONOSCOPY     DILATION AND CURETTAGE OF UTERUS  1984   ERCP  09/03/2011   Procedure: ENDOSCOPIC RETROGRADE CHOLANGIOPANCREATOGRAPHY (ERCP);  Surgeon: Missy Sabins, MD;  Location[facility] ENDOSCOPY;  Service: Endoscopy;  Laterality: N/A;  sphincterotomy and stone extraction   SHOULDER ARTHROSCOPY W/ ROTATOR CUFF REPAIR  2012   left   TONSILLECTOMY  ~ [place]?     OB History   No obstetric history on file.  Family History  Problem Relation Age of Onset   Cancer Maternal Aunt        breast   Heart attack Father 89       Smoker   Diabetes Mother    Hyperlipidemia Mother    Anesthesia problems Neg Hx    Hypotension Neg Hx     Social History   Tobacco Use   Smoking status: Never   Smokeless tobacco: Never  Vaping Use   Vaping Use: Never used  Substance Use Topics   Alcohol use: No   Drug use: No    Home Medications Prior to Admission medications   Medication Sig Start Date End Date Taking? Authorizing Provider  acetaminophen (TYLENOL) 500 MG tablet Take 500-1,000 mg by mouth  every 4 (four) hours as needed for mild pain, moderate pain, fever or headache. For pain     [provider]  atorvastatin (LIPITOR) 40 MG tablet Take 1 tablet (40 mg total) by mouth daily. 09/08/17 09/08/18  Vicie Mutters R, PA-C  blood glucose meter kit and supplies Check sugar 3 times a day. DxE11.9Dispense based insurance preference. 09/11/17   Vladimir Crofts, PA-C  buPROPion (WELLBUTRIN XL) 300 MG 24 hr tablet Take 300 mg by mouth daily.  05/21/14   [provider]  Cholecalciferol (VITAMIN D) 2000 units tablet Take 2,000 Units by mouth daily with breakfast.    [provider]  diclofenac sodium (VOLTAREN) 1 % GEL Apply 2 g topically 4 (four) times daily. Patient taking differently: Apply 2 g topically 4 (four) times daily as needed (for pain). 02/04/16   Unk Pinto, MD  DROPLET PEN NEEDLES 31G X 8 MM MISC USE AS DIRECTED  WITH  LANTUS  INJECTIONS 03/08/18   Unk Pinto, MD  gabapentin (NEURONTIN) 100 MG capsule Take 1 capsule (100 mg total) by mouth 3 (three) times daily. Patient not taking: Reported on 12/29/2017 09/08/17 09/08/18  Vicie Mutters R, PA-C  glucose blood test strip One touch Test blood sugar TID PRN DX insulin dependent diabetic E11.9 09/09/17   Vladimir Crofts, PA-C  hydrocortisone (PROCTO-PAK) 1 % CREA Apply once or twice daily as needed for hemorrhoids 08/07/16   Vladimir Crofts, PA-C  LANTUS SOLOSTAR 100 UNIT/ML Solostar Pen INJECT 8 UNITS SUBCUTANEOUSLY EVERY DAY AS DIRECTED OR AS DIRECTED BY PRIMARY CARE PHYSICIAN DISCARD PEN 28 DAYS AFTER OPENING 01/12/18   Unk Pinto, MD  losartan (COZAAR) 100 MG tablet TAKE 1/2 TO 1 TABLET DAILY AS DIRECTED FOR BLOOD PRESSURE AND KIDNEY PROTECTION Patient taking differently: Take 50-100 mg by mouth daily. 12/03/16   Unk Pinto, MD  meloxicam (MOBIC) 7.5 MG tablet Take 1 tablet (7.5 mg total) by mouth 2 (two) times daily as needed for pain. 11/13/15   Shawnee Knapp, MD  mirtazapine (REMERON) 15  MG tablet Take 15 mg by mouth at bedtime.    [provider]  omeprazole (PRILOSEC) 40 MG capsule TAKE 1 CAPSULE EVERY DAY 02/08/18   Unk Pinto, MD  Saxagliptin-Metformin (KOMBIGLYZE XR) 2.09-998 MG TB24 TAKE 1 TABLET TWICE DAILY WITH A MEAL 11/04/17   Unk Pinto, MD  triamcinolone ointment (KENALOG) 0.1 % Apply 1 application topically 2 (two) times daily. 08/07/16   Vladimir Crofts, PA-C  venlafaxine XR (EFFEXOR-XR) 150 MG 24 hr capsule Take 150 mg by mouth daily with breakfast.  05/21/14   [provider]  zolpidem (AMBIEN) 10 MG tablet Take 10 mg by mouth at bedtime.  05/06/11   [provider]    Allergies    Codeine, Glucophage [metformin hcl], and Nsaids  Review of Systems   Review of Systems  Constitutional:  Negative for fever.  HENT:  Negative for congestion, rhinorrhea and sore throat.   Respiratory:  Negative for shortness of breath.   Cardiovascular:  Negative for chest pain.  Gastrointestinal:  Positive for abdominal pain, diarrhea and nausea. Negative for blood in stool, constipation and vomiting.  Genitourinary:  Negative for difficulty urinating, dysuria and frequency.  Musculoskeletal:  Positive for back pain.  Neurological:  Negative for light-headedness and headaches.   Physical Exam Updated Vital Signs BP (!) 126/55    Pulse 67    Temp (!) 97.5 F (36.4 C) (Oral)    Resp 14    Ht $R'5\' 3"'Ry$  (1.6 m)    Wt 63.5 kg    SpO2 98%    BMI 24.80 kg/m   Physical Exam Constitutional:      Appearance: She is well-developed.     Comments: Mild distress, restless  HENT:     Head: Normocephalic.  Eyes:     Comments: Pale conjunctiva  Cardiovascular:     Rate and Rhythm: Normal rate and regular rhythm.     Heart sounds: Normal heart sounds. No murmur heard. Pulmonary:     Effort: Pulmonary effort is normal. No respiratory distress.     Breath sounds: No stridor. No wheezing or rales.  Abdominal:     General: Bowel sounds are decreased.  There is no distension.     Palpations: Abdomen is soft.     Tenderness: There is generalized abdominal tenderness. There is no right CVA tenderness, left CVA tenderness, guarding or rebound.     Hernia: No hernia is present.  Skin:    General: Skin is warm and dry.  Neurological:     General: No focal deficit present.  Psychiatric:        Mood and Affect: Mood normal.    ED Results / Procedures / Treatments   Labs (all labs ordered are listed, but only abnormal results are displayed) Labs Reviewed  CBC WITH DIFFERENTIAL/PLATELET - Abnormal; Notable for the following components:      Result Value   WBC 16.6 (*)    Hemoglobin 10.9 (*)    HCT 33.4 (*)    Platelets 424 (*)    Neutro Abs 13.0 (*)    Abs Immature Granulocytes 0.08 (*)    All other components within normal limits  COMPREHENSIVE METABOLIC PANEL - Abnormal; Notable for the following components:   CO2 21 (*)    Glucose, Bld 274 (*)    Alkaline Phosphatase 131 (*)    All other components within normal limits  LIPASE, BLOOD  URINALYSIS, ROUTINE W REFLEX MICROSCOPIC    EKG None  Radiology No results found.  Procedures Procedures   Medications Ordered in ED Medications - No data to display  ED Course  I have reviewed the triage vital signs and the nursing notes.  Pertinent labs & imaging results that were available during my care of the patient were reviewed by me and considered in my medical decision making (see chart for details).   MDM Rules/Calculators/A&P                          Shawnice B Hem is a 65 y.o. female with subacute but worsening left-sided abdominal pain with associated nausea and decreased appetite. Afebrile and vitals stable here. Initial blood  work notable for lipase 43, CBC with leukocytosis of 16.6 with left shift, normocytic anemia of 10.9, platelets elevated to 424. CMP with stable electrolytes and elevated alk phos to 131. Examination was non-focal. She had generalized tenderness in  all quadrants without rebound/guarding. Hypoactive bowel sounds. She appeared mildly uncomfortable restless but non-toxic. Conjunctiva were pale. and  Will continue workup with abdominal CT to further evaluate. Appears GI was going to schedule her for an EGD.   8:30AM: CT returned with left obstructing renal stone, appears to be around 5 mm. Pain control with fentanyl given that she is still having persistent pain despite Percocet.  I will obtain UA. Still afebrile at this time.    928: UA with 100 hemoglobin, 30 protein, negative nitrates and negative leukocytes.  No concern for infection at this time.  Will discharge patient with pain control, antinausea medication, tamsulosin.  Urology information was provided to follow-up outpatient.  Return precautions discussed.  Patient stable for discharge home.   Final Clinical Impression(s) / ED Diagnoses Final diagnoses:  None    Rx / DC Orders ED Discharge Orders     None        Sharion Settler, DO 04/30/21 6016    Lucrezia Starch, MD 04/30/21 1128

## 2021-04-30 NOTE — ED Triage Notes (Signed)
Pt is c/o left quadrant pain that started 5 weeks ago  Pt states she has been seen by her PCP and is scheduled to have a CT this afternoon but tonight the pain is worse and she cannot wait  Pt c/o nausea  Denies vomiting or diarrhea

## 2021-05-06 ENCOUNTER — Other Ambulatory Visit: Payer: Self-pay | Admitting: Urology

## 2021-05-06 ENCOUNTER — Other Ambulatory Visit: Payer: Self-pay

## 2021-05-06 ENCOUNTER — Encounter (HOSPITAL_BASED_OUTPATIENT_CLINIC_OR_DEPARTMENT_OTHER): Payer: Self-pay | Admitting: Urology

## 2021-05-06 NOTE — Progress Notes (Signed)
Spoke w/ via phone for pre-op interview--- pt Lab needs dos----  ekg             Lab results------ pt has current lab work result in epic dated 12-13-2022m,  CBCdiff/ CMP COVID test -----patient states asymptomatic no test needed Arrive at ------- 0730 on 05-08-2021 NPO after MN NO Solid Food.  Clear liquids from MN until--- 0630 Med rec completed Medications to take morning of surgery ----- wellbutrin, effexor, flomax, protonix Diabetic medication ----- do not take kombiglyze morning of surgery and do half dose lantus insulin night before surgery Patient instructed no nail polish to be worn day of surgery Patient instructed to bring photo id and insurance card day of surgery Patient aware to have Driver (ride ) / caregiver for 24 hours after surgery --husband, dillard Patient Special Instructions ----- n/a Pre-Op special Istructions ----- n/a Patient verbalized understanding of instructions that were given at this phone interview. Patient denies shortness of breath, chest pain, fever, cough at this phone interview.

## 2021-05-07 ENCOUNTER — Other Ambulatory Visit: Payer: Medicare HMO

## 2021-05-07 NOTE — Anesthesia Preprocedure Evaluation (Addendum)
Anesthesia Evaluation  Patient identified by MRN, date of birth, ID band  Reviewed: Allergy & Precautions, NPO status , Patient's Chart, lab work & pertinent test results  Airway Mallampati: II  TM Distance: >3 FB Neck ROM: Full    Dental no notable dental hx. (+) Teeth Intact, Dental Advisory Given   Pulmonary sleep apnea ,    Pulmonary exam normal breath sounds clear to auscultation       Cardiovascular hypertension, Normal cardiovascular exam Rhythm:Regular Rate:Normal     Neuro/Psych    GI/Hepatic Neg liver ROS, GERD  ,  Endo/Other  diabetes, Type 2  Renal/GU Renal diseasenephrolithiasis     Musculoskeletal negative musculoskeletal ROS (+)   Abdominal   Peds  Hematology Lab Results      Component                Value               Date                      WBC                      16.6 (H)            04/30/2021                HGB                      10.9 (L)            04/30/2021                HCT                      33.4 (L)            04/30/2021                MCV                      81.5                04/30/2021                PLT                      424 (H)             04/30/2021              Anesthesia Other Findings  ALL Glucophage codeine  Reproductive/Obstetrics                           Anesthesia Physical Anesthesia Plan  ASA: 2  Anesthesia Plan: General   Post-op Pain Management: Dilaudid IV and Tylenol PO (pre-op)   Induction: Intravenous  PONV Risk Score and Plan: 4 or greater and Treatment may vary due to age or medical condition and Ondansetron  Airway Management Planned: LMA  Additional Equipment: None  Intra-op Plan:   Post-operative Plan:   Informed Consent: I have reviewed the patients History and Physical, chart, labs and discussed the procedure including the risks, benefits and alternatives for the proposed anesthesia with the patient or authorized  representative who has indicated his/her understanding and acceptance.     Dental advisory given  Plan Discussed with: CRNA and Anesthesiologist  Anesthesia Plan  Comments: (LMA GA  Pt not given midazolam by CRNA because she usuallyl sleeps all day)      Anesthesia Quick Evaluation

## 2021-05-08 ENCOUNTER — Encounter (HOSPITAL_BASED_OUTPATIENT_CLINIC_OR_DEPARTMENT_OTHER)
Admission: RE | Disposition: A | Discharge status: Home / Self Care | Payer: Self-pay | Source: Home / Self Care | Attending: Urology

## 2021-05-08 ENCOUNTER — Ambulatory Visit (HOSPITAL_BASED_OUTPATIENT_CLINIC_OR_DEPARTMENT_OTHER)
Admission: RE | Admit: 2021-05-08 | Discharge: 2021-05-08 | Disposition: A | Discharge status: Home / Self Care | Payer: Medicare HMO | Attending: Urology | Admitting: Urology

## 2021-05-08 ENCOUNTER — Ambulatory Visit (HOSPITAL_BASED_OUTPATIENT_CLINIC_OR_DEPARTMENT_OTHER): Payer: Medicare HMO | Admitting: Anesthesiology

## 2021-05-08 ENCOUNTER — Encounter (HOSPITAL_BASED_OUTPATIENT_CLINIC_OR_DEPARTMENT_OTHER): Payer: Self-pay | Admitting: Urology

## 2021-05-08 DIAGNOSIS — E119 Type 2 diabetes mellitus without complications: Secondary | ICD-10-CM | POA: Diagnosis not present

## 2021-05-08 DIAGNOSIS — Z794 Long term (current) use of insulin: Secondary | ICD-10-CM | POA: Insufficient documentation

## 2021-05-08 DIAGNOSIS — N201 Calculus of ureter: Secondary | ICD-10-CM | POA: Diagnosis present

## 2021-05-08 DIAGNOSIS — Z886 Allergy status to analgesic agent status: Secondary | ICD-10-CM | POA: Diagnosis not present

## 2021-05-08 HISTORY — DX: Diaphragmatic hernia without obstruction or gangrene: K44.9

## 2021-05-08 HISTORY — DX: Type 2 diabetes mellitus without complications: Z79.4

## 2021-05-08 HISTORY — DX: Presence of spectacles and contact lenses: Z97.3

## 2021-05-08 HISTORY — PX: CYSTOSCOPY/URETEROSCOPY/HOLMIUM LASER/STENT PLACEMENT: SHX6546

## 2021-05-08 HISTORY — DX: Personal history of urinary calculi: Z87.442

## 2021-05-08 HISTORY — DX: Obstructive sleep apnea (adult) (pediatric): G47.33

## 2021-05-08 HISTORY — DX: Type 2 diabetes mellitus without complications: E11.9

## 2021-05-08 HISTORY — DX: Calculus of ureter: N20.1

## 2021-05-08 LAB — GLUCOSE, CAPILLARY
Glucose-Capillary: 124 mg/dL — ABNORMAL HIGH (ref 70–99)
Glucose-Capillary: 148 mg/dL — ABNORMAL HIGH (ref 70–99)

## 2021-05-08 SURGERY — CYSTOSCOPY/URETEROSCOPY/HOLMIUM LASER/STENT PLACEMENT
Anesthesia: General | Site: Ureter | Laterality: Left

## 2021-05-08 MED ORDER — LIDOCAINE 2% (20 MG/ML) 5 ML SYRINGE
INTRAMUSCULAR | Status: DC | PRN
  Administered 2021-05-08: 100 mg via INTRAVENOUS

## 2021-05-08 MED ORDER — ONDANSETRON HCL 4 MG/2ML IJ SOLN
INTRAMUSCULAR | Status: DC | PRN
  Administered 2021-05-08: 4 mg via INTRAVENOUS

## 2021-05-08 MED ORDER — CIPROFLOXACIN IN D5W 400 MG/200ML IV SOLN
INTRAVENOUS | Status: AC
  Filled 2021-05-08: qty 200

## 2021-05-08 MED ORDER — DEXAMETHASONE SODIUM PHOSPHATE 10 MG/ML IJ SOLN
INTRAMUSCULAR | Status: AC
  Filled 2021-05-08: qty 1

## 2021-05-08 MED ORDER — PROPOFOL 10 MG/ML IV BOLUS
INTRAVENOUS | Status: AC
  Filled 2021-05-08: qty 20

## 2021-05-08 MED ORDER — LACTATED RINGERS IV SOLN: INTRAVENOUS | Status: DC

## 2021-05-08 MED ORDER — CEPHALEXIN 500 MG PO CAPS: 500.0000 mg | ORAL_CAPSULE | Freq: Two times a day (BID) | ORAL | 0 refills | Status: DC

## 2021-05-08 MED ORDER — FENTANYL CITRATE (PF) 100 MCG/2ML IJ SOLN
INTRAMUSCULAR | Status: DC | PRN
  Administered 2021-05-08: 50 ug via INTRAVENOUS

## 2021-05-08 MED ORDER — FENTANYL CITRATE (PF) 100 MCG/2ML IJ SOLN: 25.0000 ug | INTRAMUSCULAR | Status: DC | PRN

## 2021-05-08 MED ORDER — ONDANSETRON HCL 4 MG/2ML IJ SOLN
INTRAMUSCULAR | Status: AC
  Filled 2021-05-08: qty 2

## 2021-05-08 MED ORDER — PHENAZOPYRIDINE HCL 200 MG PO TABS: 200.0000 mg | ORAL_TABLET | Freq: Three times a day (TID) | ORAL | 0 refills | Status: AC | PRN

## 2021-05-08 MED ORDER — PHENYLEPHRINE 40 MCG/ML (10ML) SYRINGE FOR IV PUSH (FOR BLOOD PRESSURE SUPPORT)
PREFILLED_SYRINGE | INTRAVENOUS | Status: AC
  Filled 2021-05-08: qty 10

## 2021-05-08 MED ORDER — IOHEXOL 300 MG/ML  SOLN
INTRAMUSCULAR | Status: DC | PRN
  Administered 2021-05-08: 09:00:00 10 mL

## 2021-05-08 MED ORDER — ONDANSETRON HCL 4 MG/2ML IJ SOLN: 4.0000 mg | Freq: Once | INTRAMUSCULAR | Status: DC | PRN

## 2021-05-08 MED ORDER — MIDAZOLAM HCL 2 MG/2ML IJ SOLN
INTRAMUSCULAR | Status: AC
  Filled 2021-05-08: qty 2

## 2021-05-08 MED ORDER — ACETAMINOPHEN 10 MG/ML IV SOLN: 1000.0000 mg | Freq: Once | INTRAVENOUS | Status: DC | PRN

## 2021-05-08 MED ORDER — LIDOCAINE 2% (20 MG/ML) 5 ML SYRINGE
INTRAMUSCULAR | Status: AC
  Filled 2021-05-08: qty 5

## 2021-05-08 MED ORDER — SODIUM CHLORIDE 0.9 % IR SOLN
Status: DC | PRN
  Administered 2021-05-08: 3000 mL

## 2021-05-08 MED ORDER — CIPROFLOXACIN IN D5W 400 MG/200ML IV SOLN
400.0000 mg | INTRAVENOUS | Status: AC
  Administered 2021-05-08: 08:00:00 400 mg via INTRAVENOUS

## 2021-05-08 MED ORDER — FENTANYL CITRATE (PF) 100 MCG/2ML IJ SOLN
INTRAMUSCULAR | Status: AC
  Filled 2021-05-08: qty 2

## 2021-05-08 MED ORDER — PROPOFOL 10 MG/ML IV BOLUS
INTRAVENOUS | Status: DC | PRN
  Administered 2021-05-08: 150 mg via INTRAVENOUS

## 2021-05-08 MED ORDER — OXYBUTYNIN CHLORIDE 5 MG PO TABS: 5.0000 mg | ORAL_TABLET | Freq: Three times a day (TID) | ORAL | 1 refills | Status: DC | PRN

## 2021-05-08 MED ORDER — KETOROLAC TROMETHAMINE 30 MG/ML IJ SOLN
INTRAMUSCULAR | Status: DC | PRN
  Administered 2021-05-08: 30 mg via INTRAVENOUS

## 2021-05-08 MED ORDER — PHENYLEPHRINE 40 MCG/ML (10ML) SYRINGE FOR IV PUSH (FOR BLOOD PRESSURE SUPPORT)
PREFILLED_SYRINGE | INTRAVENOUS | Status: DC | PRN
  Administered 2021-05-08 (×2): 120 ug via INTRAVENOUS
  Administered 2021-05-08 (×2): 80 ug via INTRAVENOUS

## 2021-05-08 SURGICAL SUPPLY — 29 items
BAG DRAIN URO-CYSTO SKYTR STRL (DRAIN) ×3 IMPLANT
BAG DRN UROCATH (DRAIN) ×1
BASKET ZERO TIP NITINOL 2.4FR (BASKET) IMPLANT
BSKT STON RTRVL ZERO TP 2.4FR (BASKET)
CATH INTERMIT  6FR 70CM (CATHETERS) ×5 IMPLANT
CATH URETERAL DUAL LUMEN 10F (MISCELLANEOUS) ×2 IMPLANT
CLOTH BEACON ORANGE TIMEOUT ST (SAFETY) ×3 IMPLANT
COVER DOME SNAP 22 D (MISCELLANEOUS) ×3 IMPLANT
DRSG TEGADERM 2-3/8X2-3/4 SM (GAUZE/BANDAGES/DRESSINGS) ×2 IMPLANT
ELECT REM PT RETURN 9FT ADLT (ELECTROSURGICAL)
ELECTRODE REM PT RTRN 9FT ADLT (ELECTROSURGICAL) IMPLANT
FIBER LASER FLEXIVA 365 (UROLOGICAL SUPPLIES) IMPLANT
GLOVE SURG ENC MOIS LTX SZ7 (GLOVE) ×3 IMPLANT
GOWN STRL REUS W/TWL LRG LVL3 (GOWN DISPOSABLE) ×3 IMPLANT
GUIDEWIRE ANG ZIPWIRE 038X150 (WIRE) ×2 IMPLANT
GUIDEWIRE STR DUAL SENSOR (WIRE) ×4 IMPLANT
IV NS IRRIG 3000ML ARTHROMATIC (IV SOLUTION) ×6 IMPLANT
KIT TURNOVER CYSTO (KITS) ×3 IMPLANT
MANIFOLD NEPTUNE II (INSTRUMENTS) ×3 IMPLANT
NS IRRIG 500ML POUR BTL (IV SOLUTION) ×3 IMPLANT
PACK CYSTO (CUSTOM PROCEDURE TRAY) ×3 IMPLANT
SHEATH URETERAL 12FRX28CM (UROLOGICAL SUPPLIES) ×2 IMPLANT
SHEATH URETERAL 12FRX35CM (MISCELLANEOUS) IMPLANT
STENT URET 6FRX22 CONTOUR (STENTS) ×2 IMPLANT
TRACTIP FLEXIVA PULS ID 200XHI (Laser) IMPLANT
TRACTIP FLEXIVA PULSE ID 200 (Laser) ×3
TUBE CONNECTING 12'X1/4 (SUCTIONS)
TUBE CONNECTING 12X1/4 (SUCTIONS) IMPLANT
TUBING UROLOGY SET (TUBING) IMPLANT

## 2021-05-08 NOTE — Anesthesia Postprocedure Evaluation (Signed)
Anesthesia Post Note  Patient: Delora Fuel  Procedure(s) Performed: LEFT /URETEROSCOPY/HOLMIUM LASER/STENT PLACEMENT (Left: Ureter)     Patient location during evaluation: PACU Anesthesia Type: General Level of consciousness: awake and alert Pain management: pain level controlled Vital Signs Assessment: post-procedure vital signs reviewed and stable Respiratory status: spontaneous breathing, nonlabored ventilation, respiratory function stable and patient connected to nasal cannula oxygen Cardiovascular status: blood pressure returned to baseline and stable Postop Assessment: no apparent nausea or vomiting Anesthetic complications: no   No notable events documented.  Last Vitals:  Vitals:   05/08/21 0945 05/08/21 1000  BP: 122/68 125/70  Pulse: 80 73  Resp: 14 14  Temp: 36.8 C   SpO2: 97% 99%    Last Pain:  Vitals:   05/08/21 1000  TempSrc:   PainSc: 0-No pain                 Barnet Glasgow

## 2021-05-08 NOTE — Op Note (Signed)
Operative Note  Preoperative diagnosis:  1.  Left ureteral stone  Postoperative diagnosis: 1.  Left ureteral stone  Procedure(s): 1.  Left ureteroscopy with laser lithotripsy and basket extraction of stones 2. Cystoscopy  3. Left retrograde pyelogram 4. Left ureteral stent placement 6x22 cm 5. Fluoroscopy with intraoperative interpretation  Surgeon: Donald Pore, MD  Assistants:  None  Anesthesia:  General  Complications:  None  EBL:  Minimal  Specimens: none  Drains/Catheters: 1.  Left 6Fr x 22cm ureteral stent with string  Intraoperative findings:   Cystoscopy demonstrated unremarkable bladder Ureteroscopy demonstrated stone in collecting system Successful stent placement.  Indication:  Mindy Richard is a 65 y.o. female with a symptomatic L ureteral stone  Description of procedure: After informed consent was obtained from the patient, the patient was identified and taken to the operating room and placed in the supine position.  General anesthesia was administered as well as perioperative IV antibiotics.  At the beginning of the case, a time-out was performed to properly identify the patient, the surgery to be performed, and the surgical site.  Sequential compression devices were applied to the lower extremities at the beginning of the case for DVT prophylaxis.  The patient was then placed in the dorsal lithotomy supine position, prepped and draped in sterile fashion.  Preliminary scout fluoroscopy did not reveal an obvious stone. We then passed the 21-French rigid cystoscope through the urethra and into the bladder under vision without any difficulty.  A systematic evaluation of the bladder revealed no evidence of any suspicious bladder lesions.  Ureteral orifices were in normal position.    Under cystoscopic and flouroscopic guidance, we cannulated the ureteral orifice with a 5-French open-ended ureteral catheter and a gentle retrograde pyelogram was performed, revealing  a normal caliber ureter without any filling defects. There was mild hydronephrosis of the collecting system. There was a filling defect in the mid ureter corresponding to the stone identified on CT. A 0.038 zip wire was then passed up to the level of the renal pelvis and secured to the drape as a safety wire. The ureteral catheter and cystoscope were removed, leaving the safety wire in place.   A semi-rigid ureteroscope was passed alongside the wire up the distal ureter which appeared normal. I was able to advance above the pelvic inlet but was not able to visualize the stone. The ureterscope was removed and a dual lumen catheter was inserted over the wire into the distal ureter. This was used to place a second, sensor wire. subsequently a 11/13Fr ureteral access sheath was carefully advanced up the ureter to the level of the UPJ over this wire under fluoroscopic guidance. The flexible ureteroscope was advanced into the collecting system via the access sheath. The collecting system was inspected. The calculus was identified. Using the 272 micron holmium laser fiber, the stone was fragmented completely. No fragments large enough to basket were present. With the ureteroscope in the kidney, a gentle pyelogram was performed to delineate the calyceal system and we evaluated the calyces systematically. We encountered no further stones. The rest of the stone fragments were very tiny and these were  irrigated away gently. The calyces were re-inspected and there were no significant stone fragment residual.   We then withdrew the ureteroscope back down the ureter along with the access sheath, noting no evidence of any stones along the course of the ureter.  Prior to removing the ureteroscope, we did pass the Glidewire back up to the ureter to the  renal pelvis.  Once the ureteroscope was removed, we then used the Glidewire under fluoroscopic guidance and passed up a 6-French x 22 cm double-pigtail ureteral stent up the  ureter, making sure that the proximal and distal ends coiled within the kidney and bladder respectively.  Note that we left a  long tether string attached to the distal end of the ureteral stent and it exited the urethral meatus and was secured to mons with a tegaderm adhesive.  The cystoscope was then advanced back into the bladder under vision.  We were able to see the distal stent coiling nicely within the bladder.  The bladder was then emptied with irrigation solution.  The cystoscope was then removed.    The patient tolerated the procedure well and there was no complication. Patient was awoken from anesthesia and taken to the recovery room in stable condition. I was present and scrubbed for the entirety of the case.  Plan:  Patient will be discharged home. Patient may remove the stent at home after 3 days   G. Donald Pore MD [facility]  Pager: [phone]

## 2021-05-08 NOTE — Anesthesia Procedure Notes (Signed)
Procedure Name: LMA Insertion Date/Time: 05/08/2021 8:50 AM Performed by: Bonney Aid, CRNA Pre-anesthesia Checklist: Patient identified, Emergency Drugs available, Suction available and Patient being monitored Patient Re-evaluated:Patient Re-evaluated prior to induction Oxygen Delivery Method: Circle system utilized Preoxygenation: Pre-oxygenation with 100% oxygen Induction Type: IV induction Ventilation: Mask ventilation without difficulty LMA: LMA inserted LMA Size: 4.0 Number of attempts: 1 Airway Equipment and Method: Bite block Placement Confirmation: positive ETCO2 Tube secured with: Tape Dental Injury: Teeth and Oropharynx as per pre-operative assessment

## 2021-05-08 NOTE — Transfer of Care (Signed)
Immediate Anesthesia Transfer of Care Note  Patient: Mindy Richard  Procedure(s) Performed: LEFT /URETEROSCOPY/HOLMIUM LASER/STENT PLACEMENT (Left: Ureter)  Patient Location: PACU  Anesthesia Type:General  Level of Consciousness: awake and oriented  Airway & Oxygen Therapy: Patient Spontanous Breathing and Patient connected to nasal cannula oxygen  Post-op Assessment: Report given to RN  Post vital signs: Reviewed and stable  Last Vitals:  Vitals Value Taken Time  BP 122/68 05/08/21 0946  Temp    Pulse 80 05/08/21 0947  Resp 13 05/08/21 0947  SpO2 99 % 05/08/21 0947  Vitals shown include unvalidated device data.  Last Pain:  Vitals:   05/08/21 0809  TempSrc: Oral  PainSc: 0-No pain      Patients Stated Pain Goal: 4 ([id number])  Complications: No notable events documented.

## 2021-05-08 NOTE — Discharge Instructions (Addendum)
[facility] [phone] Post Ureteroscopy With or Without Stent Instructions  Definitions:  Ureter: The duct that transports urine from the kidney to the bladder. Stent:   A plastic hollow tube that is placed into the ureter, from the kidney to the bladder to prevent the ureter from swelling shut.  **you may remove the stent by pulling on the string after 3 days**  GENERAL INSTRUCTIONS:  Despite the fact that no skin incisions were used, the area around the ureter and bladder is raw and irritated. The stent is a foreign body which will further irritate the bladder wall. This irritation is manifested by increased frequency of urination, both day and night, and by an increase in the urge to urinate. In some, the urge to urinate is present almost always. Sometimes the urge is strong enough that you may not be able to stop yourself from urinating. The only real cure is to remove the stent and then give time for the bladder wall to heal which can't be done until the danger of the ureter swelling shut has passed, which varies.  You may see some blood in your urine while the stent is in place and a few days afterwards. Do not be alarmed, even if the urine was clear for a while. Get off your feet and drink lots of fluids until clearing occurs. If you start to pass clots or don't improve, call us.  DIET: You may return to your normal diet immediately. Because of the raw surface of your bladder, alcohol, spicy foods, acid type foods and drinks with caffeine may cause irritation or frequency and should be used in moderation. To keep your urine flowing freely and to avoid constipation, drink plenty of fluids during the day ( 8-10 glasses ). Tip: Avoid cranberry juice because it is very acidic.  ACTIVITY: Your physical activity doesn't need to be restricted. However, if you are very active, you may see some blood in your urine. We suggest that you reduce your activity under these  circumstances until the bleeding has stopped.  BOWELS: It is important to keep your bowels regular during the postoperative period. Straining with bowel movements can cause bleeding. A bowel movement every other day is reasonable. Use a mild laxative if needed, such as Milk of Magnesia 2-3 tablespoons, or 2 Dulcolax tablets. Call if you continue to have problems. If you have been taking narcotics for pain, before, during or after your surgery, you may be constipated. Take a laxative if necessary.   MEDICATION: You should resume your pre-surgery medications unless told not to. In addition you will often be given an antibiotic to prevent infection. These should be taken as prescribed until the bottles are finished unless you are having an unusual reaction to one of the drugs.  PROBLEMS YOU SHOULD REPORT TO [place]: Fevers over 100.5 Fahrenheit. Heavy bleeding, or clots ( See above notes about blood in urine ). Inability to urinate. Drug reactions ( hives, rash, nausea, vomiting, diarrhea ). Severe burning or pain with urination that is not improving.  FOLLOW-UP: You will need a follow-up appointment to monitor your progress. Call for this appointment at the number listed above. Usually the first appointment will be about three to fourteen days after your surgery.    Post Anesthesia Home Care Instructions  Activity: Get plenty of rest for the remainder of the day. A responsible individual must stay with you for 24 hours following the procedure.  For the next 24 hours, DO NOT: -Drive a  car -Paediatric nurse -Drink alcoholic beverages -Take any medication unless instructed by your physician -Make any legal decisions or sign important papers.  Meals: Start with liquid foods such as gelatin or soup. Progress to regular foods as tolerated. Avoid greasy, spicy, heavy foods. If nausea and/or vomiting occur, drink only clear liquids until the nausea and/or vomiting subsides. Call your physician if  vomiting continues.  Special Instructions/Symptoms: Your throat may feel dry or sore from the anesthesia or the breathing tube placed in your throat during surgery. If this causes discomfort, gargle with warm salt water. The discomfort should disappear within 24 hours.  If you had a scopolamine patch placed behind your ear for the management of post- operative nausea and/or vomiting:  1. The medication in the patch is effective for 72 hours, after which it should be removed.  Wrap patch in a tissue and discard in the trash. Wash hands thoroughly with soap and water. 2. You may remove the patch earlier than 72 hours if you experience unpleasant side effects which may include dry mouth, dizziness or visual disturbances. 3. Avoid touching the patch. Wash your hands with soap and water after contact with the patch.

## 2021-05-08 NOTE — H&P (Signed)
CC/HPI: Mindy Richard is a 65 year old female who presents today for fleft ureteroscopy with laser litho for a left ureteral stone  No interval changes to history    ALLERGIES: NSAIDs    MEDICATIONS: Percocet 5 mg-325 mg tablet 1-2 tablet PO Q 6 H PRN  Atorvastatin Calcium 40 mg tablet  Bupropion Hcl  Kombiglyze Xr 2.5 mg-1,000 mg tablet,extended release multiphase 24 hr  Lantus Solostar  Losartan Potassium  Pantoprazole Sodium 40 mg tablet, delayed release  Venlafaxine Hcl 75 mg tablet  Zolpidem Tartrate     GU PSH: No GU PSH    NON-GU PSH: Carpal tunnel surgery, Bilateral Cesarean Delivery Remove Gallbladder Shoulder Arthroscopy/surgery, Left     GU PMH: Ureteral calculus, Left - 05/01/2021    NON-GU PMH: Anxiety Depression Diabetes Type 2 GERD Heartburn Hypercholesterolemia Hypertension Sleep Apnea    FAMILY HISTORY: 1 son - Son Heart Attack - Father   SOCIAL HISTORY: Marital Status: Married Preferred Language: English; Ethnicity: Not Hispanic Or Latino; Race: White Current Smoking Status: Patient has never smoked.   Tobacco Use Assessment Completed: Used Tobacco in last 30 days? Does not use smokeless tobacco. Has never drank.  Drinks 2 caffeinated drinks per day. Patient's occupation is/was Retired.    REVIEW OF SYSTEMS:    GU Review Female:   Patient denies frequent urination, hard to postpone urination, burning /pain with urination, get up at night to urinate, leakage of urine, stream starts and stops, trouble starting your stream, have to strain to urinate, and being pregnant.  Gastrointestinal (Upper):   Patient denies nausea, vomiting, and indigestion/ heartburn.  Gastrointestinal (Lower):   Patient denies diarrhea and constipation.  Constitutional:   Patient denies fever, night sweats, weight loss, and fatigue.  Skin:   Patient denies skin rash/ lesion and itching.  Eyes:   Patient denies blurred vision and double vision.  Ears/ Nose/ Throat:    Patient denies sore throat and sinus problems.  Hematologic/Lymphatic:   Patient denies swollen glands and easy bruising.  Cardiovascular:   Patient denies leg swelling and chest pains.  Respiratory:   Patient denies cough and shortness of breath.  Endocrine:   Patient denies excessive thirst.  Musculoskeletal:   Patient denies back pain and joint pain.  Neurological:   Patient denies headaches and dizziness.  Psychologic:   Patient denies depression and anxiety.   VITAL SIGNS:      05/06/2021 01:58 PM  Weight 140 lb / 63.5 kg  Height 63 in / 160.02 cm  BP 136/73 mmHg  Pulse 97 /min  Temperature 98.2 F / 36.7 C  BMI 24.8 kg/m   MULTI-SYSTEM PHYSICAL EXAMINATION:    Constitutional: Well-nourished. No physical deformities. Normally developed. Good grooming.  Respiratory: CTAB Cardiovascular: RRR Neurologic / Psychiatric: Oriented to time, oriented to place, oriented to person. No depression, no anxiety, no agitation.  Eyes: Normal conjunctivae. Normal eyelids.  Musculoskeletal: Normal gait and station of head and neck.     Complexity of Data:  Source Of History:  Patient  Records Review:   Previous Patient Records  Urine Test Review:   Urinalysis  X-Ray Review: Outside CT: Reviewed Films.  KUB: Reviewed Films. Discussed With Patient.     05/06/21 05/01/21  Urinalysis  Urine Appearance Slightly Cloudy  Clear   Urine Color Yellow  Yellow   Urine Glucose Neg mg/dL Neg mg/dL  Urine Bilirubin Neg mg/dL Neg mg/dL  Urine Ketones Neg mg/dL Neg mg/dL  Urine Specific Gravity 1.020  1.020   Urine  Blood Trace ery/uL 1+ ery/uL  Urine pH <=5.0  <=5.0   Urine Protein Neg mg/dL Trace mg/dL  Urine Urobilinogen 0.2 mg/dL 0.2 mg/dL  Urine Nitrites Neg  Neg   Urine Leukocyte Esterase 1+ leu/uL 1+ leu/uL  Urine WBC/hpf 10 - 20/hpf  0 - 5/hpf   Urine RBC/hpf 0 - 2/hpf  0 - 2/hpf   Urine Epithelial Cells 0 - 5/hpf  0 - 5/hpf   Urine Bacteria Few (10-25/hpf)  Rare (0-9/hpf)   Urine Mucous  Not Present  Present   Urine Yeast NS (Not Seen)  NS (Not Seen)   Urine Trichomonas Not Present  Not Present   Urine Cystals NS (Not Seen)  NS (Not Seen)   Urine Casts NS (Not Seen)  NS (Not Seen)   Urine Sperm Not Present  Not Present   Urine C&S  Culture, Urine  -    PROCEDURES:         KUB - 91791  A single view of the abdomen is obtained.      Patient confirmed No Neulasta OnPro Device.           Urinalysis w/Scope - 81001 Dipstick Dipstick Cont'd Micro  Color: Yellow Bilirubin: Neg WBC/hpf: 10 - 20/hpf  Appearance: Slightly Cloudy Ketones: Neg RBC/hpf: 0 - 2/hpf  Specific Gravity: 1.020 Blood: Trace Bacteria: Few (10-25/hpf)  pH: <=5.0 Protein: Neg Cystals: NS (Not Seen)  Glucose: Neg Urobilinogen: 0.2 Casts: NS (Not Seen)    Nitrites: Neg Trichomonas: Not Present    Leukocyte Esterase: 1+ Mucous: Not Present      Epithelial Cells: 0 - 5/hpf      Yeast: NS (Not Seen)      Sperm: Not Present    Notes:      ASSESSMENT:      ICD-10 Details  1 GU:   Ureteral calculus - N20.1 Left, Acute, Uncomplicated   PLAN:     Proceed with L ureteroscopy + laser litho, stent placement as planned. We again reviewed risks of the operation, including hematuria, infection, sepsis, retained stone fragments, future procedures, ureteral damage. We also discussed it is possible I will not be able to address the stone today in which case I would leave a stent and plan for staged ureteroscopy. Ms Logsdon voiced understanding.

## 2021-05-08 NOTE — Interval H&P Note (Signed)
History and Physical Interval Note:  05/08/2021 8:11 AM  Mindy Richard  has presented today for surgery, with the diagnosis of LEFT URETETAL STONE.  The various methods of treatment have been discussed with the patient and family. After consideration of risks, benefits and other options for treatment, the patient has consented to  Procedure(s): LEFT /URETEROSCOPY/HOLMIUM LASER/STENT PLACEMENT (Left) as a surgical intervention.  The patient's history has been reviewed, patient examined, no change in status, stable for surgery.  I have reviewed the patient's chart and labs.  Questions were answered to the patient's satisfaction.     Lavarius Doughten L Zain Bingman

## 2021-05-09 ENCOUNTER — Encounter (HOSPITAL_BASED_OUTPATIENT_CLINIC_OR_DEPARTMENT_OTHER): Payer: Self-pay | Admitting: Urology

## 2021-12-18 DIAGNOSIS — G3184 Mild cognitive impairment, so stated: Secondary | ICD-10-CM | POA: Diagnosis not present

## 2022-01-27 DIAGNOSIS — J069 Acute upper respiratory infection, unspecified: Secondary | ICD-10-CM | POA: Diagnosis not present

## 2022-01-27 DIAGNOSIS — Z20822 Contact with and (suspected) exposure to covid-19: Secondary | ICD-10-CM | POA: Diagnosis not present

## 2022-02-26 DIAGNOSIS — E118 Type 2 diabetes mellitus with unspecified complications: Secondary | ICD-10-CM | POA: Diagnosis not present

## 2022-02-26 DIAGNOSIS — D473 Essential (hemorrhagic) thrombocythemia: Secondary | ICD-10-CM | POA: Diagnosis not present

## 2022-02-26 DIAGNOSIS — K635 Polyp of colon: Secondary | ICD-10-CM | POA: Diagnosis not present

## 2022-02-26 DIAGNOSIS — D509 Iron deficiency anemia, unspecified: Secondary | ICD-10-CM | POA: Diagnosis not present

## 2022-04-15 DIAGNOSIS — R200 Anesthesia of skin: Secondary | ICD-10-CM | POA: Diagnosis not present

## 2022-04-15 DIAGNOSIS — G3184 Mild cognitive impairment, so stated: Secondary | ICD-10-CM | POA: Diagnosis not present

## 2022-04-15 DIAGNOSIS — D72829 Elevated white blood cell count, unspecified: Secondary | ICD-10-CM | POA: Diagnosis not present

## 2022-04-15 DIAGNOSIS — H6192 Disorder of left external ear, unspecified: Secondary | ICD-10-CM | POA: Diagnosis not present

## 2022-04-18 ENCOUNTER — Other Ambulatory Visit: Payer: Self-pay | Admitting: Family Medicine

## 2022-04-18 ENCOUNTER — Ambulatory Visit
Admission: RE | Admit: 2022-04-18 | Discharge: 2022-04-18 | Disposition: A | Discharge status: Home / Self Care | Payer: Medicare Other | Source: Ambulatory Visit | Attending: Family Medicine | Admitting: Family Medicine

## 2022-04-18 DIAGNOSIS — M549 Dorsalgia, unspecified: Secondary | ICD-10-CM | POA: Diagnosis not present

## 2022-04-18 DIAGNOSIS — M5440 Lumbago with sciatica, unspecified side: Secondary | ICD-10-CM

## 2022-04-18 DIAGNOSIS — M545 Low back pain, unspecified: Secondary | ICD-10-CM | POA: Diagnosis not present

## 2022-04-22 DIAGNOSIS — F02A0 Dementia in other diseases classified elsewhere, mild, without behavioral disturbance, psychotic disturbance, mood disturbance, and anxiety: Secondary | ICD-10-CM | POA: Diagnosis not present

## 2022-04-22 DIAGNOSIS — G3184 Mild cognitive impairment, so stated: Secondary | ICD-10-CM | POA: Diagnosis not present

## 2022-04-22 DIAGNOSIS — G3183 Dementia with Lewy bodies: Secondary | ICD-10-CM | POA: Diagnosis not present

## 2022-05-22 DIAGNOSIS — Z20828 Contact with and (suspected) exposure to other viral communicable diseases: Secondary | ICD-10-CM | POA: Diagnosis not present

## 2022-05-22 DIAGNOSIS — J069 Acute upper respiratory infection, unspecified: Secondary | ICD-10-CM | POA: Diagnosis not present

## 2022-05-22 DIAGNOSIS — F331 Major depressive disorder, recurrent, moderate: Secondary | ICD-10-CM | POA: Diagnosis not present

## 2022-05-22 DIAGNOSIS — R413 Other amnesia: Secondary | ICD-10-CM | POA: Diagnosis not present

## 2022-05-22 DIAGNOSIS — E1140 Type 2 diabetes mellitus with diabetic neuropathy, unspecified: Secondary | ICD-10-CM | POA: Diagnosis not present

## 2022-06-05 DIAGNOSIS — E785 Hyperlipidemia, unspecified: Secondary | ICD-10-CM | POA: Diagnosis not present

## 2022-06-05 DIAGNOSIS — D72829 Elevated white blood cell count, unspecified: Secondary | ICD-10-CM | POA: Diagnosis not present

## 2022-06-05 DIAGNOSIS — D75838 Other thrombocytosis: Secondary | ICD-10-CM | POA: Diagnosis not present

## 2022-06-16 DIAGNOSIS — Z0100 Encounter for examination of eyes and vision without abnormal findings: Secondary | ICD-10-CM | POA: Diagnosis not present

## 2022-06-27 DIAGNOSIS — E118 Type 2 diabetes mellitus with unspecified complications: Secondary | ICD-10-CM | POA: Diagnosis not present

## 2022-06-27 DIAGNOSIS — H524 Presbyopia: Secondary | ICD-10-CM | POA: Diagnosis not present

## 2022-06-27 DIAGNOSIS — E785 Hyperlipidemia, unspecified: Secondary | ICD-10-CM | POA: Diagnosis not present

## 2022-10-24 DIAGNOSIS — Z1231 Encounter for screening mammogram for malignant neoplasm of breast: Secondary | ICD-10-CM | POA: Diagnosis not present

## 2022-10-31 DIAGNOSIS — D509 Iron deficiency anemia, unspecified: Secondary | ICD-10-CM | POA: Diagnosis not present

## 2022-10-31 DIAGNOSIS — G3184 Mild cognitive impairment, so stated: Secondary | ICD-10-CM | POA: Diagnosis not present

## 2022-10-31 DIAGNOSIS — R000 Tachycardia, unspecified: Secondary | ICD-10-CM | POA: Diagnosis not present

## 2022-10-31 DIAGNOSIS — I10 Essential (primary) hypertension: Secondary | ICD-10-CM | POA: Diagnosis not present

## 2022-10-31 DIAGNOSIS — E118 Type 2 diabetes mellitus with unspecified complications: Secondary | ICD-10-CM | POA: Diagnosis not present

## 2022-11-19 DIAGNOSIS — K080 Exfoliation of teeth due to systemic causes: Secondary | ICD-10-CM | POA: Diagnosis not present

## 2022-11-26 DIAGNOSIS — K080 Exfoliation of teeth due to systemic causes: Secondary | ICD-10-CM | POA: Diagnosis not present

## 2022-12-02 DIAGNOSIS — E118 Type 2 diabetes mellitus with unspecified complications: Secondary | ICD-10-CM | POA: Diagnosis not present

## 2022-12-02 DIAGNOSIS — N289 Disorder of kidney and ureter, unspecified: Secondary | ICD-10-CM | POA: Diagnosis not present

## 2022-12-04 DIAGNOSIS — K080 Exfoliation of teeth due to systemic causes: Secondary | ICD-10-CM | POA: Diagnosis not present

## 2022-12-22 DIAGNOSIS — K080 Exfoliation of teeth due to systemic causes: Secondary | ICD-10-CM | POA: Diagnosis not present

## 2023-01-13 DIAGNOSIS — K080 Exfoliation of teeth due to systemic causes: Secondary | ICD-10-CM | POA: Diagnosis not present

## 2023-01-18 DIAGNOSIS — E119 Type 2 diabetes mellitus without complications: Secondary | ICD-10-CM | POA: Diagnosis not present

## 2023-02-02 DIAGNOSIS — F331 Major depressive disorder, recurrent, moderate: Secondary | ICD-10-CM | POA: Diagnosis not present

## 2023-02-02 DIAGNOSIS — Z794 Long term (current) use of insulin: Secondary | ICD-10-CM | POA: Diagnosis not present

## 2023-02-02 DIAGNOSIS — M6284 Sarcopenia: Secondary | ICD-10-CM | POA: Diagnosis not present

## 2023-02-02 DIAGNOSIS — Z7984 Long term (current) use of oral hypoglycemic drugs: Secondary | ICD-10-CM | POA: Diagnosis not present

## 2023-02-03 DIAGNOSIS — Z124 Encounter for screening for malignant neoplasm of cervix: Secondary | ICD-10-CM | POA: Diagnosis not present

## 2023-02-16 DIAGNOSIS — K080 Exfoliation of teeth due to systemic causes: Secondary | ICD-10-CM | POA: Diagnosis not present

## 2023-02-17 DIAGNOSIS — E119 Type 2 diabetes mellitus without complications: Secondary | ICD-10-CM | POA: Diagnosis not present

## 2023-02-17 DIAGNOSIS — M6281 Muscle weakness (generalized): Secondary | ICD-10-CM | POA: Diagnosis not present

## 2023-02-18 DIAGNOSIS — K080 Exfoliation of teeth due to systemic causes: Secondary | ICD-10-CM | POA: Diagnosis not present

## 2023-02-23 DIAGNOSIS — M6281 Muscle weakness (generalized): Secondary | ICD-10-CM | POA: Diagnosis not present

## 2023-02-25 DIAGNOSIS — M6281 Muscle weakness (generalized): Secondary | ICD-10-CM | POA: Diagnosis not present

## 2023-03-02 DIAGNOSIS — M6281 Muscle weakness (generalized): Secondary | ICD-10-CM | POA: Diagnosis not present

## 2023-03-04 DIAGNOSIS — M6281 Muscle weakness (generalized): Secondary | ICD-10-CM | POA: Diagnosis not present

## 2023-03-09 DIAGNOSIS — M6281 Muscle weakness (generalized): Secondary | ICD-10-CM | POA: Diagnosis not present

## 2023-03-18 DIAGNOSIS — M6281 Muscle weakness (generalized): Secondary | ICD-10-CM | POA: Diagnosis not present

## 2023-03-20 DIAGNOSIS — E119 Type 2 diabetes mellitus without complications: Secondary | ICD-10-CM | POA: Diagnosis not present

## 2023-03-25 DIAGNOSIS — M6281 Muscle weakness (generalized): Secondary | ICD-10-CM | POA: Diagnosis not present

## 2023-03-30 DIAGNOSIS — M6281 Muscle weakness (generalized): Secondary | ICD-10-CM | POA: Diagnosis not present

## 2023-04-01 DIAGNOSIS — M6281 Muscle weakness (generalized): Secondary | ICD-10-CM | POA: Diagnosis not present

## 2023-04-06 DIAGNOSIS — E1122 Type 2 diabetes mellitus with diabetic chronic kidney disease: Secondary | ICD-10-CM | POA: Diagnosis not present

## 2023-04-06 DIAGNOSIS — E1136 Type 2 diabetes mellitus with diabetic cataract: Secondary | ICD-10-CM | POA: Diagnosis not present

## 2023-04-06 DIAGNOSIS — E118 Type 2 diabetes mellitus with unspecified complications: Secondary | ICD-10-CM | POA: Diagnosis not present

## 2023-04-06 DIAGNOSIS — Z7984 Long term (current) use of oral hypoglycemic drugs: Secondary | ICD-10-CM | POA: Diagnosis not present

## 2023-04-06 DIAGNOSIS — Z87442 Personal history of urinary calculi: Secondary | ICD-10-CM | POA: Diagnosis not present

## 2023-04-06 DIAGNOSIS — N182 Chronic kidney disease, stage 2 (mild): Secondary | ICD-10-CM | POA: Diagnosis not present

## 2023-04-06 DIAGNOSIS — Z794 Long term (current) use of insulin: Secondary | ICD-10-CM | POA: Diagnosis not present

## 2023-04-06 DIAGNOSIS — E785 Hyperlipidemia, unspecified: Secondary | ICD-10-CM | POA: Diagnosis not present

## 2023-04-06 DIAGNOSIS — E1140 Type 2 diabetes mellitus with diabetic neuropathy, unspecified: Secondary | ICD-10-CM | POA: Diagnosis not present

## 2023-04-06 DIAGNOSIS — E559 Vitamin D deficiency, unspecified: Secondary | ICD-10-CM | POA: Diagnosis not present

## 2023-04-06 DIAGNOSIS — D473 Essential (hemorrhagic) thrombocythemia: Secondary | ICD-10-CM | POA: Diagnosis not present

## 2023-04-08 DIAGNOSIS — M6281 Muscle weakness (generalized): Secondary | ICD-10-CM | POA: Diagnosis not present

## 2023-04-13 DIAGNOSIS — M6281 Muscle weakness (generalized): Secondary | ICD-10-CM | POA: Diagnosis not present

## 2023-04-19 DIAGNOSIS — E119 Type 2 diabetes mellitus without complications: Secondary | ICD-10-CM | POA: Diagnosis not present

## 2023-04-20 DIAGNOSIS — M6281 Muscle weakness (generalized): Secondary | ICD-10-CM | POA: Diagnosis not present

## 2023-04-22 DIAGNOSIS — M6281 Muscle weakness (generalized): Secondary | ICD-10-CM | POA: Diagnosis not present

## 2023-05-05 DIAGNOSIS — E119 Type 2 diabetes mellitus without complications: Secondary | ICD-10-CM | POA: Diagnosis not present

## 2023-05-20 DIAGNOSIS — E119 Type 2 diabetes mellitus without complications: Secondary | ICD-10-CM | POA: Diagnosis not present

## 2023-05-30 ENCOUNTER — Emergency Department (HOSPITAL_COMMUNITY): Payer: Medicare Other

## 2023-05-30 ENCOUNTER — Emergency Department (HOSPITAL_COMMUNITY)
Admission: EM | Admit: 2023-05-30 | Discharge: 2023-05-30 | Disposition: A | Discharge status: Home / Self Care | Payer: Medicare Other | Attending: Emergency Medicine | Admitting: Emergency Medicine

## 2023-05-30 ENCOUNTER — Encounter (HOSPITAL_COMMUNITY): Payer: Self-pay

## 2023-05-30 ENCOUNTER — Other Ambulatory Visit: Payer: Self-pay

## 2023-05-30 DIAGNOSIS — N1330 Unspecified hydronephrosis: Secondary | ICD-10-CM | POA: Diagnosis not present

## 2023-05-30 DIAGNOSIS — I10 Essential (primary) hypertension: Secondary | ICD-10-CM | POA: Insufficient documentation

## 2023-05-30 DIAGNOSIS — R1030 Lower abdominal pain, unspecified: Secondary | ICD-10-CM | POA: Diagnosis not present

## 2023-05-30 DIAGNOSIS — Z794 Long term (current) use of insulin: Secondary | ICD-10-CM | POA: Diagnosis not present

## 2023-05-30 DIAGNOSIS — N200 Calculus of kidney: Secondary | ICD-10-CM | POA: Diagnosis not present

## 2023-05-30 DIAGNOSIS — Z79899 Other long term (current) drug therapy: Secondary | ICD-10-CM | POA: Diagnosis not present

## 2023-05-30 DIAGNOSIS — R109 Unspecified abdominal pain: Secondary | ICD-10-CM | POA: Diagnosis not present

## 2023-05-30 LAB — CBC
HCT: 33.2 % — ABNORMAL LOW (ref 36.0–46.0)
Hemoglobin: 10.9 g/dL — ABNORMAL LOW (ref 12.0–15.0)
MCH: 30.5 pg (ref 26.0–34.0)
MCHC: 32.8 g/dL (ref 30.0–36.0)
MCV: 93 fL (ref 80.0–100.0)
Platelets: 409 10*3/uL — ABNORMAL HIGH (ref 150–400)
RBC: 3.57 MIL/uL — ABNORMAL LOW (ref 3.87–5.11)
RDW: 12.7 % (ref 11.5–15.5)
WBC: 12.5 10*3/uL — ABNORMAL HIGH (ref 4.0–10.5)
nRBC: 0 % (ref 0.0–0.2)

## 2023-05-30 LAB — URINALYSIS, ROUTINE W REFLEX MICROSCOPIC
Bacteria, UA: NONE SEEN
Bilirubin Urine: NEGATIVE
Glucose, UA: NEGATIVE mg/dL
Hgb urine dipstick: NEGATIVE
Ketones, ur: 5 mg/dL — AB
Leukocytes,Ua: NEGATIVE
Nitrite: NEGATIVE
Protein, ur: 30 mg/dL — AB
Specific Gravity, Urine: 1.025 (ref 1.005–1.030)
pH: 5 (ref 5.0–8.0)

## 2023-05-30 LAB — COMPREHENSIVE METABOLIC PANEL
ALT: 29 U/L (ref 0–44)
AST: 47 U/L — ABNORMAL HIGH (ref 15–41)
Albumin: 3.7 g/dL (ref 3.5–5.0)
Alkaline Phosphatase: 67 U/L (ref 38–126)
Anion gap: 11 (ref 5–15)
BUN: 20 mg/dL (ref 8–23)
CO2: 16 mmol/L — ABNORMAL LOW (ref 22–32)
Calcium: 9.2 mg/dL (ref 8.9–10.3)
Chloride: 105 mmol/L (ref 98–111)
Creatinine, Ser: 0.85 mg/dL (ref 0.44–1.00)
GFR, Estimated: >60 mL/min (ref >60)
Glucose, Bld: 224 mg/dL — ABNORMAL HIGH (ref 70–99)
Potassium: 4.8 mmol/L (ref 3.5–5.1)
Sodium: 132 mmol/L — ABNORMAL LOW (ref 135–145)
Total Bilirubin: 0.7 mg/dL (ref 0.0–1.2)
Total Protein: 7.5 g/dL (ref 6.5–8.1)

## 2023-05-30 LAB — LIPASE, BLOOD: Lipase: 60 U/L — ABNORMAL HIGH (ref 11–51)

## 2023-05-30 MED ORDER — ONDANSETRON 4 MG PO TBDP
4.0000 mg | ORAL_TABLET | Freq: Three times a day (TID) | ORAL | 0 refills | Status: AC | PRN
Start: 2023-05-30 — End: ?

## 2023-05-30 MED ORDER — OXYCODONE-ACETAMINOPHEN 5-325 MG PO TABS: 1.0000 | ORAL_TABLET | ORAL | 0 refills | Status: AC | PRN

## 2023-05-30 MED ORDER — FENTANYL CITRATE PF 50 MCG/ML IJ SOSY
50.0000 ug | PREFILLED_SYRINGE | Freq: Once | INTRAMUSCULAR | Status: AC
  Administered 2023-05-30: 50 ug via INTRAVENOUS
  Filled 2023-05-30: qty 1

## 2023-05-30 MED ORDER — TAMSULOSIN HCL 0.4 MG PO CAPS: 0.4000 mg | ORAL_CAPSULE | Freq: Every day | ORAL | 0 refills | Status: DC

## 2023-05-30 MED ORDER — ONDANSETRON HCL 4 MG/2ML IJ SOLN
4.0000 mg | Freq: Once | INTRAMUSCULAR | Status: AC
  Administered 2023-05-30: 4 mg via INTRAVENOUS
  Filled 2023-05-30: qty 2

## 2023-05-30 MED ORDER — OXYCODONE-ACETAMINOPHEN 5-325 MG PO TABS
1.0000 | ORAL_TABLET | Freq: Once | ORAL | Status: AC
  Administered 2023-05-30: 1 via ORAL
  Filled 2023-05-30: qty 1

## 2023-05-30 NOTE — ED Triage Notes (Signed)
 Pt reports LLQ abdominal pain that radiates to left flank. Pt has hx of kidney stones.

## 2023-05-30 NOTE — ED Provider Notes (Signed)
 [place] AT [facility] Provider Note   CSN: [id number] Arrival date & time: 05/30/23  0330     History  Chief Complaint  Patient presents with   Abdominal Pain   Flank Pain    Mindy Richard is a 68 y.o. female.   Abdominal Pain Flank Pain Associated symptoms include abdominal pain.   68 y.o. F with hx of HTN, depression, GERD, OSA, kidney stones, presenting to the ED with left flank pain.  States felt some discomfort last night after eating but eased off and went to bed.  Woke up this AM with sharp, stabbing pain radiating into left flank.  Some nausea but no vomiting.  Urine looked dark upon arrival but no frank blood per patient.  No fever/chills.  Hx of stones in the past with similar symptoms.  Home Medications Prior to Admission medications   Medication Sig Start Date End Date Taking? Authorizing Provider  ondansetron  (ZOFRAN -ODT) 4 MG disintegrating tablet Take 1 tablet (4 mg total) by mouth every 8 (eight) hours as needed for nausea. 05/30/23  Yes Jarold Olam HERO, PA-C  oxyCODONE -acetaminophen  (PERCOCET) 5-325 MG tablet Take 1 tablet by mouth every 4 (four) hours as needed. 05/30/23  Yes Jarold Olam HERO, PA-C  tamsulosin  (FLOMAX ) 0.4 MG CAPS capsule Take 1 capsule (0.4 mg total) by mouth daily after supper. 05/30/23  Yes Jarold Olam HERO, PA-C  atorvastatin  (LIPITOR) 40 MG tablet Take 1 tablet (40 mg total) by mouth daily. Patient taking differently: Take 40 mg by mouth at bedtime. 09/08/17 05/06/21  Craig Palma R, PA-C  blood glucose meter kit and supplies Check sugar 3 times a day. [id number] based insurance preference. 09/11/17   Craig Palma SAUNDERS, PA-C  buPROPion  (WELLBUTRIN  XL) 300 MG 24 hr tablet Take 300 mg by mouth daily.  05/21/14   [provider]  cephALEXin  (KEFLEX ) 500 MG capsule Take 1 capsule (500 mg total) by mouth 2 (two) times daily. 05/08/21   Lovie Arlyss CROME, MD  Cholecalciferol  (VITAMIN D ) 2000 units tablet  Take 2,000 Units by mouth daily with breakfast.    [provider]  diclofenac  sodium (VOLTAREN ) 1 % GEL Apply 2 g topically 4 (four) times daily. Patient taking differently: Apply 2 g topically 4 (four) times daily as needed (for pain). 02/04/16   Tonita Fallow, MD  DROPLET PEN NEEDLES 31G X 8 MM MISC USE AS DIRECTED  WITH  LANTUS   INJECTIONS 03/08/18   Tonita Fallow, MD  ferrous sulfate 325 (65 FE) MG tablet Take 325 mg by mouth 3 (three) times a week. M/ W/ F    [provider]  gabapentin  (NEURONTIN ) 100 MG capsule Take 1 capsule (100 mg total) by mouth 3 (three) times daily. Patient not taking: Reported on 05/06/2021 09/08/17 09/08/18  Craig Palma R, PA-C  glucose blood test strip One touch Test blood sugar TID PRN DX insulin  dependent diabetic E11.9 09/09/17   Craig Palma SAUNDERS, PA-C  hydrocortisone  (PROCTO-PAK) 1 % CREA Apply once or twice daily as needed for hemorrhoids Patient not taking: Reported on 05/06/2021 08/07/16   Craig Palma SAUNDERS, PA-C  LANTUS  SOLOSTAR 100 UNIT/ML Solostar Pen INJECT 8 UNITS SUBCUTANEOUSLY EVERY DAY AS DIRECTED OR AS DIRECTED BY PRIMARY CARE PHYSICIAN DISCARD PEN 28 DAYS AFTER OPENING Patient taking differently: 10 Units at bedtime. 01/12/18   Tonita Fallow, MD  losartan  (COZAAR ) 100 MG tablet TAKE 1/2 TO 1 TABLET DAILY AS DIRECTED FOR BLOOD PRESSURE AND KIDNEY PROTECTION Patient taking  differently: Take 50 mg by mouth daily. 12/03/16   Tonita Fallow, MD  mirtazapine (REMERON) 15 MG tablet Take 7.5 mg by mouth at bedtime.    [provider]  oxybutynin  (DITROPAN ) 5 MG tablet Take 1 tablet (5 mg total) by mouth 3 (three) times daily as needed for bladder spasms. 05/08/21   Lovie Arlyss CROME, MD  pantoprazole  (PROTONIX ) 40 MG tablet Take 40 mg by mouth daily.    [provider]  Saxagliptin -Metformin  (KOMBIGLYZE XR) 2.09-998 MG TB24 TAKE 1 TABLET TWICE DAILY WITH A MEAL Patient taking differently: Take 1 tablet by mouth 2  (two) times daily. TAKE 1 TABLET TWICE DAILY WITH A MEAL 11/04/17   Tonita Fallow, MD  triamcinolone  ointment (KENALOG ) 0.1 % Apply 1 application topically 2 (two) times daily. Patient not taking: Reported on 05/06/2021 08/07/16   Craig Alan SAUNDERS, PA-C  venlafaxine  XR (EFFEXOR -XR) 150 MG 24 hr capsule Take 150 mg by mouth daily with breakfast.  05/21/14   [provider]  zolpidem  (AMBIEN ) 10 MG tablet Take 10 mg by mouth at bedtime.  05/06/11   [provider]      Allergies    Codeine, Glucophage  [metformin  hcl], and Nsaids    Review of Systems   Review of Systems  Gastrointestinal:  Positive for abdominal pain.  Genitourinary:  Positive for flank pain.  All other systems reviewed and are negative.   Physical Exam Updated Vital Signs BP 133/74   Pulse 82   Temp 97.8 F (36.6 C) (Oral)   Resp 18   Ht 5' 3 (1.6 m)   Wt 63.5 kg   SpO2 100%   BMI 24.80 kg/m   Physical Exam Vitals and nursing note reviewed.  Constitutional:      Appearance: She is well-developed.  HENT:     Head: Normocephalic and atraumatic.  Eyes:     Conjunctiva/sclera: Conjunctivae normal.     Pupils: Pupils are equal, round, and reactive to light.  Cardiovascular:     Rate and Rhythm: Normal rate and regular rhythm.     Heart sounds: Normal heart sounds.  Pulmonary:     Effort: Pulmonary effort is normal.     Breath sounds: Normal breath sounds.  Abdominal:     General: Bowel sounds are normal.     Palpations: Abdomen is soft.     Tenderness: There is abdominal tenderness. There is no rebound.     Comments: Mildly tender left lateral abdomen  Musculoskeletal:        General: Normal range of motion.     Cervical back: Normal range of motion.  Skin:    General: Skin is warm and dry.  Neurological:     Mental Status: She is alert and oriented to person, place, and time.     ED Results / Procedures / Treatments   Labs (all labs ordered are listed, but only abnormal  results are displayed) Labs Reviewed  LIPASE, BLOOD - Abnormal; Notable for the following components:      Result Value   Lipase 60 (*)    All other components within normal limits  COMPREHENSIVE METABOLIC PANEL - Abnormal; Notable for the following components:   Sodium 132 (*)    CO2 16 (*)    Glucose, Bld 224 (*)    AST 47 (*)    All other components within normal limits  CBC - Abnormal; Notable for the following components:   WBC 12.5 (*)    RBC 3.57 (*)  Hemoglobin 10.9 (*)    HCT 33.2 (*)    Platelets 409 (*)    All other components within normal limits  URINALYSIS, ROUTINE W REFLEX MICROSCOPIC - Abnormal; Notable for the following components:   APPearance HAZY (*)    Ketones, ur 5 (*)    Protein, ur 30 (*)    All other components within normal limits    EKG None  Radiology CT Renal Stone Study Result Date: 05/30/2023 CLINICAL DATA:  68 year old female with abdomen and flank pain. Left lower quadrant pain radiating to the flank. History of kidney stones. EXAM: CT ABDOMEN AND PELVIS WITHOUT CONTRAST TECHNIQUE: Multidetector CT imaging of the abdomen and pelvis was performed following the standard protocol without IV contrast. RADIATION DOSE REDUCTION: This exam was performed according to the departmental dose-optimization program which includes automated exposure control, adjustment of the mA and/or kV according to patient size and/or use of iterative reconstruction technique. COMPARISON:  CT Abdomen and Pelvis 04/30/2021. FINDINGS: Lower chest: Stable and negative. Hepatobiliary: Chronic cholecystectomy.  Negative noncontrast liver. Pancreas: Negative. Spleen: Negative. Adrenals/Urinary Tract: Normal adrenal glands. Nonobstructed right kidney with round 4 mm lower pole calculus. Decompressed and negative right ureter. Oval 8-9 mm calculus within the upper pole left renal collecting system series 2, image 28 and coronal image 105. And the more proximal upper pole calices do  appear mildly dilated (coronal image 112). But otherwise the left hydronephrosis demonstrated in 2022 is resolved. Left renal pelvis is diminutive. No other left renal calculus. Decompressed left ureter to the bladder. Decompressed bladder.  Chronic pelvic phleboliths. Stomach/Bowel: Redundant but otherwise negative large bowel in the pelvis. Average volume of retained stool. Cecum is on a lax mesentery in the anterior right abdomen, right colon is redundant. No large bowel inflammation, no diverticulosis identified. Diminutive appendix suspected on coronal image 70 and negative. Decompressed terminal ileum. No dilated small bowel. Some retained food and fluid in the stomach. Duodenum appears negative. No free air, free fluid, mesenteric inflammation identified. Vascular/Lymphatic: Normal caliber abdominal aorta. Minimal calcified atherosclerosis in the abdomen and pelvis. More moderate femoral artery calcified atherosclerosis partially visible. Vascular patency is not evaluated in the absence of IV contrast. No lymphadenopathy. Reproductive: Negative. Other: No pelvis free fluid. Musculoskeletal: Chronic lumbar spine degeneration. Partially visible flowing endplate osteophytes in the thoracic spine. Chronic lower sacral S5 fracture, healed and stable. No acute osseous abnormality identified. IMPRESSION: 1. Oval 8-9 mm calculus is obstructing the Left renal upper pole calices (coronal images 105 through 112). But otherwise no left hydronephrosis, no other obstructive uropathy. 2. Mild right nephrolithiasis. No other acute or inflammatory process identified in the noncontrast abdomen or pelvis. Electronically Signed   By: VEAR Hurst M.D.   On: 05/30/2023 04:46    Procedures Procedures    Medications Ordered in ED Medications  oxyCODONE -acetaminophen  (PERCOCET/ROXICET) 5-325 MG per tablet 1 tablet (has no administration in time range)  fentaNYL  (SUBLIMAZE ) injection 50 mcg (50 mcg Intravenous Given 05/30/23  0404)  ondansetron  (ZOFRAN ) injection 4 mg (4 mg Intravenous Given 05/30/23 0403)    ED Course/ Medical Decision Making/ A&P                                 Medical Decision Making Amount and/or Complexity of Data Reviewed Labs: ordered. Radiology: ordered and independent interpretation performed. ECG/medicine tests: ordered and independent interpretation performed.  Risk Prescription drug management.   67 year old female presenting to the  ED with left flank pain.  Some pain last night after dinner but acutely worsened this morning.  Some nausea without vomiting.  History of kidney stones which felt similar.  She is afebrile and nontoxic.  She does have some mild tenderness to the left lateral abdomen.  Labs as above--mild leukocytosis, normal renal function.  Lipase is 60 but not felt to be clinically significant.  UA without hematuria or other signs of infection.  CT stone study with 8-75mm calculus left upper renal pole calyces, no hydronephrosis.  Patient's pain is well controlled after dose of fentanyl .  No vomiting here.    Discussed with patient that size of stone is on the larger size but possibility it still may pass on its own.  She has had prior stenting and would like to avoid that is possible.  As labs stable and pain controlled, feel reasonable to discharge with symptomatic management for now.  Can follow-up with urology on Monday.  Return here for any new/acute changes in the interim.  She and husband are agreeable with care plan.  Final Clinical Impression(s) / ED Diagnoses Final diagnoses:  Left flank pain  Kidney stone    Rx / DC Orders ED Discharge Orders          Ordered    oxyCODONE -acetaminophen  (PERCOCET) 5-325 MG tablet  Every 4 hours PRN        05/30/23 0458    ondansetron  (ZOFRAN -ODT) 4 MG disintegrating tablet  Every 8 hours PRN        05/30/23 0458    tamsulosin  (FLOMAX ) 0.4 MG CAPS capsule  Daily after supper        05/30/23 0458               Jarold Olam HERO, PA-C 05/30/23 0505    Bari Charmaine FALCON, MD 05/31/23 [phone]

## 2023-05-30 NOTE — Discharge Instructions (Addendum)
 Take the prescribed medication as directed.  Make sure to drink lots of fluids and stay hydrated. Follow-up with urology. Return to the ED for new or worsening symptoms-- trouble urinating, fever, chills, etc.

## 2023-06-03 DIAGNOSIS — K080 Exfoliation of teeth due to systemic causes: Secondary | ICD-10-CM | POA: Diagnosis not present

## 2023-06-04 DIAGNOSIS — I509 Heart failure, unspecified: Secondary | ICD-10-CM | POA: Diagnosis not present

## 2023-06-04 DIAGNOSIS — N1831 Chronic kidney disease, stage 3a: Secondary | ICD-10-CM | POA: Diagnosis not present

## 2023-06-04 DIAGNOSIS — E1122 Type 2 diabetes mellitus with diabetic chronic kidney disease: Secondary | ICD-10-CM | POA: Diagnosis not present

## 2023-06-04 DIAGNOSIS — Z794 Long term (current) use of insulin: Secondary | ICD-10-CM | POA: Diagnosis not present

## 2023-06-04 DIAGNOSIS — S93402A Sprain of unspecified ligament of left ankle, initial encounter: Secondary | ICD-10-CM | POA: Diagnosis not present

## 2023-06-04 DIAGNOSIS — E1136 Type 2 diabetes mellitus with diabetic cataract: Secondary | ICD-10-CM | POA: Diagnosis not present

## 2023-06-10 DIAGNOSIS — K080 Exfoliation of teeth due to systemic causes: Secondary | ICD-10-CM | POA: Diagnosis not present

## 2023-06-12 DIAGNOSIS — E2839 Other primary ovarian failure: Secondary | ICD-10-CM | POA: Diagnosis not present

## 2023-06-12 DIAGNOSIS — Z780 Asymptomatic menopausal state: Secondary | ICD-10-CM | POA: Diagnosis not present

## 2023-06-12 DIAGNOSIS — M8588 Other specified disorders of bone density and structure, other site: Secondary | ICD-10-CM | POA: Diagnosis not present

## 2023-06-20 DIAGNOSIS — E119 Type 2 diabetes mellitus without complications: Secondary | ICD-10-CM | POA: Diagnosis not present

## 2023-06-27 ENCOUNTER — Encounter (HOSPITAL_COMMUNITY): Payer: Self-pay | Admitting: Emergency Medicine

## 2023-06-27 ENCOUNTER — Emergency Department (HOSPITAL_COMMUNITY)
Admission: EM | Admit: 2023-06-27 | Discharge: 2023-06-27 | Disposition: A | Discharge status: Home / Self Care | Payer: Medicare Other | Attending: Emergency Medicine | Admitting: Emergency Medicine

## 2023-06-27 ENCOUNTER — Other Ambulatory Visit: Payer: Self-pay

## 2023-06-27 ENCOUNTER — Emergency Department (HOSPITAL_COMMUNITY): Payer: Medicare Other

## 2023-06-27 DIAGNOSIS — R1031 Right lower quadrant pain: Secondary | ICD-10-CM | POA: Diagnosis not present

## 2023-06-27 DIAGNOSIS — N132 Hydronephrosis with renal and ureteral calculous obstruction: Secondary | ICD-10-CM | POA: Diagnosis not present

## 2023-06-27 DIAGNOSIS — E785 Hyperlipidemia, unspecified: Secondary | ICD-10-CM | POA: Diagnosis not present

## 2023-06-27 DIAGNOSIS — A4181 Sepsis due to Enterococcus: Secondary | ICD-10-CM | POA: Diagnosis not present

## 2023-06-27 DIAGNOSIS — I10 Essential (primary) hypertension: Secondary | ICD-10-CM | POA: Diagnosis not present

## 2023-06-27 DIAGNOSIS — E872 Acidosis, unspecified: Secondary | ICD-10-CM | POA: Diagnosis not present

## 2023-06-27 DIAGNOSIS — R109 Unspecified abdominal pain: Secondary | ICD-10-CM | POA: Diagnosis not present

## 2023-06-27 DIAGNOSIS — N136 Pyonephrosis: Secondary | ICD-10-CM | POA: Diagnosis not present

## 2023-06-27 DIAGNOSIS — G4733 Obstructive sleep apnea (adult) (pediatric): Secondary | ICD-10-CM | POA: Diagnosis not present

## 2023-06-27 DIAGNOSIS — Z794 Long term (current) use of insulin: Secondary | ICD-10-CM | POA: Diagnosis not present

## 2023-06-27 DIAGNOSIS — K219 Gastro-esophageal reflux disease without esophagitis: Secondary | ICD-10-CM | POA: Diagnosis not present

## 2023-06-27 DIAGNOSIS — N202 Calculus of kidney with calculus of ureter: Secondary | ICD-10-CM | POA: Diagnosis not present

## 2023-06-27 DIAGNOSIS — Z87442 Personal history of urinary calculi: Secondary | ICD-10-CM | POA: Diagnosis not present

## 2023-06-27 DIAGNOSIS — D649 Anemia, unspecified: Secondary | ICD-10-CM | POA: Diagnosis not present

## 2023-06-27 DIAGNOSIS — Z7984 Long term (current) use of oral hypoglycemic drugs: Secondary | ICD-10-CM | POA: Diagnosis not present

## 2023-06-27 DIAGNOSIS — N201 Calculus of ureter: Secondary | ICD-10-CM | POA: Diagnosis not present

## 2023-06-27 DIAGNOSIS — N390 Urinary tract infection, site not specified: Secondary | ICD-10-CM | POA: Diagnosis not present

## 2023-06-27 DIAGNOSIS — E1140 Type 2 diabetes mellitus with diabetic neuropathy, unspecified: Secondary | ICD-10-CM | POA: Diagnosis not present

## 2023-06-27 DIAGNOSIS — Z886 Allergy status to analgesic agent status: Secondary | ICD-10-CM | POA: Diagnosis not present

## 2023-06-27 DIAGNOSIS — Z79899 Other long term (current) drug therapy: Secondary | ICD-10-CM | POA: Diagnosis not present

## 2023-06-27 DIAGNOSIS — Z888 Allergy status to other drugs, medicaments and biological substances status: Secondary | ICD-10-CM | POA: Diagnosis not present

## 2023-06-27 DIAGNOSIS — I959 Hypotension, unspecified: Secondary | ICD-10-CM | POA: Diagnosis not present

## 2023-06-27 DIAGNOSIS — F419 Anxiety disorder, unspecified: Secondary | ICD-10-CM | POA: Diagnosis not present

## 2023-06-27 DIAGNOSIS — Z8249 Family history of ischemic heart disease and other diseases of the circulatory system: Secondary | ICD-10-CM | POA: Diagnosis not present

## 2023-06-27 DIAGNOSIS — Z803 Family history of malignant neoplasm of breast: Secondary | ICD-10-CM | POA: Diagnosis not present

## 2023-06-27 DIAGNOSIS — N200 Calculus of kidney: Secondary | ICD-10-CM

## 2023-06-27 DIAGNOSIS — Z833 Family history of diabetes mellitus: Secondary | ICD-10-CM | POA: Diagnosis not present

## 2023-06-27 DIAGNOSIS — F325 Major depressive disorder, single episode, in full remission: Secondary | ICD-10-CM | POA: Diagnosis not present

## 2023-06-27 DIAGNOSIS — E7800 Pure hypercholesterolemia, unspecified: Secondary | ICD-10-CM | POA: Diagnosis not present

## 2023-06-27 DIAGNOSIS — E119 Type 2 diabetes mellitus without complications: Secondary | ICD-10-CM | POA: Diagnosis not present

## 2023-06-27 DIAGNOSIS — K449 Diaphragmatic hernia without obstruction or gangrene: Secondary | ICD-10-CM | POA: Diagnosis not present

## 2023-06-27 DIAGNOSIS — N1330 Unspecified hydronephrosis: Secondary | ICD-10-CM | POA: Diagnosis not present

## 2023-06-27 DIAGNOSIS — N179 Acute kidney failure, unspecified: Secondary | ICD-10-CM | POA: Diagnosis not present

## 2023-06-27 DIAGNOSIS — E1165 Type 2 diabetes mellitus with hyperglycemia: Secondary | ICD-10-CM | POA: Diagnosis not present

## 2023-06-27 DIAGNOSIS — Z885 Allergy status to narcotic agent status: Secondary | ICD-10-CM | POA: Diagnosis not present

## 2023-06-27 DIAGNOSIS — Z9049 Acquired absence of other specified parts of digestive tract: Secondary | ICD-10-CM | POA: Diagnosis not present

## 2023-06-27 LAB — CBC WITH DIFFERENTIAL/PLATELET
Abs Immature Granulocytes: 0.07 10*3/uL (ref 0.00–0.07)
Basophils Absolute: 0.1 10*3/uL (ref 0.0–0.1)
Basophils Relative: 1 %
Eosinophils Absolute: 0.3 10*3/uL (ref 0.0–0.5)
Eosinophils Relative: 3 %
HCT: 33.5 % — ABNORMAL LOW (ref 36.0–46.0)
Hemoglobin: 10.4 g/dL — ABNORMAL LOW (ref 12.0–15.0)
Immature Granulocytes: 1 %
Lymphocytes Relative: 21 %
Lymphs Abs: 2.6 10*3/uL (ref 0.7–4.0)
MCH: 29.5 pg (ref 26.0–34.0)
MCHC: 31 g/dL (ref 30.0–36.0)
MCV: 95.2 fL (ref 80.0–100.0)
Monocytes Absolute: 0.7 10*3/uL (ref 0.1–1.0)
Monocytes Relative: 5 %
Neutro Abs: 8.7 10*3/uL — ABNORMAL HIGH (ref 1.7–7.7)
Neutrophils Relative %: 69 %
Platelets: 371 10*3/uL (ref 150–400)
RBC: 3.52 MIL/uL — ABNORMAL LOW (ref 3.87–5.11)
RDW: 12.4 % (ref 11.5–15.5)
WBC: 12.4 10*3/uL — ABNORMAL HIGH (ref 4.0–10.5)
nRBC: 0 % (ref 0.0–0.2)

## 2023-06-27 LAB — URINALYSIS, W/ REFLEX TO CULTURE (INFECTION SUSPECTED)
Bacteria, UA: NONE SEEN
Bilirubin Urine: NEGATIVE
Glucose, UA: NEGATIVE mg/dL
Ketones, ur: NEGATIVE mg/dL
Leukocytes,Ua: NEGATIVE
Nitrite: NEGATIVE
Protein, ur: 100 mg/dL — AB
RBC / HPF: >50 RBC/hpf (ref 0–5)
Specific Gravity, Urine: 1.028 (ref 1.005–1.030)
pH: 5 (ref 5.0–8.0)

## 2023-06-27 LAB — COMPREHENSIVE METABOLIC PANEL
ALT: 24 U/L (ref 0–44)
AST: 29 U/L (ref 15–41)
Albumin: 3.4 g/dL — ABNORMAL LOW (ref 3.5–5.0)
Alkaline Phosphatase: 75 U/L (ref 38–126)
Anion gap: 11 (ref 5–15)
BUN: 19 mg/dL (ref 8–23)
CO2: 19 mmol/L — ABNORMAL LOW (ref 22–32)
Calcium: 9.2 mg/dL (ref 8.9–10.3)
Chloride: 106 mmol/L (ref 98–111)
Creatinine, Ser: 1.16 mg/dL — ABNORMAL HIGH (ref 0.44–1.00)
GFR, Estimated: 52 mL/min — ABNORMAL LOW (ref >60)
Glucose, Bld: 195 mg/dL — ABNORMAL HIGH (ref 70–99)
Potassium: 4.2 mmol/L (ref 3.5–5.1)
Sodium: 136 mmol/L (ref 135–145)
Total Bilirubin: 0.6 mg/dL (ref 0.0–1.2)
Total Protein: 6.6 g/dL (ref 6.5–8.1)

## 2023-06-27 LAB — LIPASE, BLOOD: Lipase: 36 U/L (ref 11–51)

## 2023-06-27 LAB — LACTIC ACID, PLASMA: Lactic Acid, Venous: 2.7 mmol/L (ref 0.5–1.9)

## 2023-06-27 MED ORDER — ONDANSETRON HCL 4 MG PO TABS: 4.0000 mg | ORAL_TABLET | Freq: Three times a day (TID) | ORAL | 0 refills | Status: DC | PRN

## 2023-06-27 MED ORDER — ONDANSETRON HCL 4 MG/2ML IJ SOLN
4.0000 mg | Freq: Once | INTRAMUSCULAR | Status: AC
  Administered 2023-06-27: 4 mg via INTRAVENOUS
  Filled 2023-06-27: qty 2

## 2023-06-27 MED ORDER — TAMSULOSIN HCL 0.4 MG PO CAPS
0.4000 mg | ORAL_CAPSULE | Freq: Once | ORAL | Status: AC
  Administered 2023-06-27: 0.4 mg via ORAL
  Filled 2023-06-27: qty 1

## 2023-06-27 MED ORDER — OXYCODONE-ACETAMINOPHEN 5-325 MG PO TABS
1.0000 | ORAL_TABLET | Freq: Once | ORAL | Status: AC
  Administered 2023-06-27: 1 via ORAL
  Filled 2023-06-27: qty 1

## 2023-06-27 MED ORDER — MORPHINE SULFATE (PF) 4 MG/ML IV SOLN
4.0000 mg | Freq: Once | INTRAVENOUS | Status: AC
  Administered 2023-06-27: 4 mg via INTRAVENOUS
  Filled 2023-06-27: qty 1

## 2023-06-27 MED ORDER — HYDROMORPHONE HCL 1 MG/ML IJ SOLN
1.0000 mg | Freq: Once | INTRAMUSCULAR | Status: AC
  Administered 2023-06-27: 1 mg via INTRAVENOUS
  Filled 2023-06-27: qty 1

## 2023-06-27 MED ORDER — IOHEXOL 300 MG/ML  SOLN
75.0000 mL | Freq: Once | INTRAMUSCULAR | Status: AC | PRN
  Administered 2023-06-27: 75 mL via INTRAVENOUS

## 2023-06-27 MED ORDER — TAMSULOSIN HCL 0.4 MG PO CAPS: 0.4000 mg | ORAL_CAPSULE | Freq: Every day | ORAL | 0 refills | Status: DC | PRN

## 2023-06-27 MED ORDER — ONDANSETRON 4 MG PO TBDP
4.0000 mg | ORAL_TABLET | Freq: Once | ORAL | Status: AC
  Administered 2023-06-27: 4 mg via ORAL
  Filled 2023-06-27: qty 1

## 2023-06-27 MED ORDER — SODIUM CHLORIDE 0.9 % IV BOLUS
500.0000 mL | Freq: Once | INTRAVENOUS | Status: AC
  Administered 2023-06-27: 500 mL via INTRAVENOUS

## 2023-06-27 MED ORDER — FENTANYL CITRATE PF 50 MCG/ML IJ SOSY
50.0000 ug | PREFILLED_SYRINGE | Freq: Once | INTRAMUSCULAR | Status: AC
  Administered 2023-06-27: 50 ug via INTRAVENOUS
  Filled 2023-06-27: qty 1

## 2023-06-27 MED ORDER — OXYCODONE-ACETAMINOPHEN 5-325 MG PO TABS: 1.0000 | ORAL_TABLET | ORAL | 0 refills | Status: DC | PRN

## 2023-06-27 MED ORDER — MAGNESIUM SULFATE 2 GM/50ML IV SOLN
2.0000 g | Freq: Once | INTRAVENOUS | Status: AC
  Administered 2023-06-27: 2 g via INTRAVENOUS
  Filled 2023-06-27: qty 50

## 2023-06-27 NOTE — ED Notes (Signed)
 Pt tolerated diet sprite and crackers. Denies N/V/D.

## 2023-06-27 NOTE — ED Provider Notes (Signed)
 [place] EMERGENCY DEPARTMENT AT [facility] Provider Note   CSN: [id number] Arrival date & time: 06/27/23  1726     History  Chief Complaint  Patient presents with   Abdominal Pain    Mindy Richard is a 68 y.o. female.  The history is provided by the patient and medical records. No language interpreter was used.  Abdominal Pain Pain location:  RLQ, RUQ and R flank (mainly RLQ) Pain quality: aching and dull   Pain radiates to:  Does not radiate Pain severity:  Severe Onset quality:  Sudden Duration:  4 hours Timing:  Constant Progression:  Waxing and waning Chronicity:  New Context: previous surgery (previous cholecystectomy)   Context: not sick contacts and not trauma   Relieved by:  Nothing Worsened by:  Nothing Ineffective treatments:  None tried Associated symptoms: chills, fatigue and nausea   Associated symptoms: no chest pain, no constipation, no cough, no diarrhea, no dysuria, no fever, no shortness of breath and no vomiting        Home Medications Prior to Admission medications   Medication Sig Start Date End Date Taking? Authorizing Provider  atorvastatin  (LIPITOR) 40 MG tablet Take 1 tablet (40 mg total) by mouth daily. Patient taking differently: Take 40 mg by mouth at bedtime. 09/08/17 05/06/21  Craig Palma R, PA-C  blood glucose meter kit and supplies Check sugar 3 times a day. DxE11.9Dispense based insurance preference. 09/11/17   Craig Palma SAUNDERS, PA-C  buPROPion  (WELLBUTRIN  XL) 300 MG 24 hr tablet Take 300 mg by mouth daily.  05/21/14   [provider]  cephALEXin  (KEFLEX ) 500 MG capsule Take 1 capsule (500 mg total) by mouth 2 (two) times daily. 05/08/21   Lovie Arlyss CROME, MD  Cholecalciferol  (VITAMIN D ) 2000 units tablet Take 2,000 Units by mouth daily with breakfast.    [provider]  diclofenac  sodium (VOLTAREN ) 1 % GEL Apply 2 g topically 4 (four) times daily. Patient taking differently: Apply 2 g topically 4  (four) times daily as needed (for pain). 02/04/16   Tonita Fallow, MD  DROPLET PEN NEEDLES 31G X 8 MM MISC USE AS DIRECTED  WITH  LANTUS   INJECTIONS 03/08/18   Tonita Fallow, MD  ferrous sulfate 325 (65 FE) MG tablet Take 325 mg by mouth 3 (three) times a week. M/ W/ F    [provider]  gabapentin  (NEURONTIN ) 100 MG capsule Take 1 capsule (100 mg total) by mouth 3 (three) times daily. Patient not taking: Reported on 05/06/2021 09/08/17 09/08/18  Craig Palma R, PA-C  glucose blood test strip One touch Test blood sugar TID PRN DX insulin  dependent diabetic E11.9 09/09/17   Craig Palma SAUNDERS, PA-C  hydrocortisone  (PROCTO-PAK) 1 % CREA Apply once or twice daily as needed for hemorrhoids Patient not taking: Reported on 05/06/2021 08/07/16   Craig Palma SAUNDERS, PA-C  LANTUS  SOLOSTAR 100 UNIT/ML Solostar Pen INJECT 8 UNITS SUBCUTANEOUSLY EVERY DAY AS DIRECTED OR AS DIRECTED BY PRIMARY CARE PHYSICIAN DISCARD PEN 28 DAYS AFTER OPENING Patient taking differently: 10 Units at bedtime. 01/12/18   Tonita Fallow, MD  losartan  (COZAAR ) 100 MG tablet TAKE 1/2 TO 1 TABLET DAILY AS DIRECTED FOR BLOOD PRESSURE AND KIDNEY PROTECTION Patient taking differently: Take 50 mg by mouth daily. 12/03/16   Tonita Fallow, MD  mirtazapine (REMERON) 15 MG tablet Take 7.5 mg by mouth at bedtime.    [provider]  ondansetron  (ZOFRAN -ODT) 4 MG disintegrating tablet Take 1 tablet (4 mg total)  by mouth every 8 (eight) hours as needed for nausea. 05/30/23   Jarold Olam HERO, PA-C  oxybutynin  (DITROPAN ) 5 MG tablet Take 1 tablet (5 mg total) by mouth 3 (three) times daily as needed for bladder spasms. 05/08/21   Lovie Arlyss CROME, MD  oxyCODONE -acetaminophen  (PERCOCET) 5-325 MG tablet Take 1 tablet by mouth every 4 (four) hours as needed. 05/30/23   Jarold Olam HERO, PA-C  pantoprazole  (PROTONIX ) 40 MG tablet Take 40 mg by mouth daily.    [provider]  Saxagliptin -Metformin  (KOMBIGLYZE XR) 2.09-998  MG TB24 TAKE 1 TABLET TWICE DAILY WITH A MEAL Patient taking differently: Take 1 tablet by mouth 2 (two) times daily. TAKE 1 TABLET TWICE DAILY WITH A MEAL 11/04/17   Tonita Fallow, MD  tamsulosin  (FLOMAX ) 0.4 MG CAPS capsule Take 1 capsule (0.4 mg total) by mouth daily after supper. 05/30/23   Jarold Olam HERO, PA-C  triamcinolone  ointment (KENALOG ) 0.1 % Apply 1 application topically 2 (two) times daily. Patient not taking: Reported on 05/06/2021 08/07/16   Craig Alan SAUNDERS, PA-C  venlafaxine  XR (EFFEXOR -XR) 150 MG 24 hr capsule Take 150 mg by mouth daily with breakfast.  05/21/14   [provider]  zolpidem  (AMBIEN ) 10 MG tablet Take 10 mg by mouth at bedtime.  05/06/11   [provider]      Allergies    Codeine, Glucophage  [metformin  hcl], and Nsaids    Review of Systems   Review of Systems  Constitutional:  Positive for chills and fatigue. Negative for fever.  HENT:  Negative for congestion.   Respiratory:  Negative for cough, chest tightness and shortness of breath.   Cardiovascular:  Negative for chest pain and palpitations.  Gastrointestinal:  Positive for abdominal pain and nausea. Negative for constipation, diarrhea and vomiting.  Genitourinary:  Positive for flank pain. Negative for dysuria and frequency.  Musculoskeletal:  Negative for back pain, neck pain and neck stiffness.  Skin:  Negative for rash and wound.  Neurological:  Negative for headaches.  Psychiatric/Behavioral:  Negative for agitation.   All other systems reviewed and are negative.   Physical Exam Updated Vital Signs BP 116/70   Pulse 76   Temp 98.1 F (36.7 C)   SpO2 100%  Physical Exam Vitals and nursing note reviewed.  Constitutional:      General: She is not in acute distress.    Appearance: She is well-developed. She is not ill-appearing, toxic-appearing or diaphoretic.  HENT:     Head: Normocephalic and atraumatic.     Mouth/Throat:     Mouth: Mucous membranes are dry.      Pharynx: No oropharyngeal exudate or posterior oropharyngeal erythema.  Eyes:     Extraocular Movements: Extraocular movements intact.     Conjunctiva/sclera: Conjunctivae normal.     Pupils: Pupils are equal, round, and reactive to light.  Cardiovascular:     Rate and Rhythm: Normal rate and regular rhythm.     Heart sounds: No murmur heard. Pulmonary:     Effort: Pulmonary effort is normal. No respiratory distress.     Breath sounds: Normal breath sounds. No wheezing, rhonchi or rales.  Chest:     Chest wall: No tenderness.  Abdominal:     General: Abdomen is flat.     Palpations: Abdomen is soft.     Tenderness: There is abdominal tenderness in the right lower quadrant. There is no right CVA tenderness, left CVA tenderness, guarding or rebound.    Musculoskeletal:  General: Tenderness present. No swelling.     Cervical back: Neck supple.  Skin:    General: Skin is warm and dry.     Capillary Refill: Capillary refill takes less than 2 seconds.     Findings: No erythema or rash.  Neurological:     Mental Status: She is alert.  Psychiatric:        Mood and Affect: Mood normal.     ED Results / Procedures / Treatments   Labs (all labs ordered are listed, but only abnormal results are displayed) Labs Reviewed  CBC WITH DIFFERENTIAL/PLATELET - Abnormal; Notable for the following components:      Result Value   WBC 12.4 (*)    RBC 3.52 (*)    Hemoglobin 10.4 (*)    HCT 33.5 (*)    Neutro Abs 8.7 (*)    All other components within normal limits  COMPREHENSIVE METABOLIC PANEL - Abnormal; Notable for the following components:   CO2 19 (*)    Glucose, Bld 195 (*)    Creatinine, Ser 1.16 (*)    Albumin 3.4 (*)    GFR, Estimated 52 (*)    All other components within normal limits  LACTIC ACID, PLASMA - Abnormal; Notable for the following components:   Lactic Acid, Venous 2.7 (*)    All other components within normal limits  URINALYSIS, W/ REFLEX TO CULTURE  (INFECTION SUSPECTED) - Abnormal; Notable for the following components:   Color, Urine AMBER (*)    APPearance CLOUDY (*)    Hgb urine dipstick LARGE (*)    Protein, ur 100 (*)    All other components within normal limits  LIPASE, BLOOD  LACTIC ACID, PLASMA    EKG None  Radiology CT ABDOMEN PELVIS W CONTRAST Result Date: 06/27/2023 CLINICAL DATA:  Right lower quadrant abdominal pain. EXAM: CT ABDOMEN AND PELVIS WITH CONTRAST TECHNIQUE: Multidetector CT imaging of the abdomen and pelvis was performed using the standard protocol following bolus administration of intravenous contrast. RADIATION DOSE REDUCTION: This exam was performed according to the departmental dose-optimization program which includes automated exposure control, adjustment of the mA and/or kV according to patient size and/or use of iterative reconstruction technique. CONTRAST:  75mL OMNIPAQUE  IOHEXOL  300 MG/ML  SOLN COMPARISON:  CT abdomen pelvis dated 05/30/2023. FINDINGS: Lower chest: The visualized lung bases are clear. No intra-abdominal free air or free fluid. Hepatobiliary: The liver is unremarkable. No biliary dilatation. Cholecystectomy. Pancreas: Unremarkable. No pancreatic ductal dilatation or surrounding inflammatory changes. Spleen: Normal in size without focal abnormality. Adrenals/Urinary Tract: The adrenal glands are unremarkable. The 4 mm stone seen in the inferior pole of the right kidney on prior CT is not located in the proximal right ureter. There is mild right hydronephrosis. A 1 cm stone in the upper pole of the left kidney on the prior CT is not located in the left renal pelvis. There is mild left pelviectasis without hydronephrosis. The urinary bladder is collapsed. Stomach/Bowel: There is no bowel obstruction or active inflammation. The appendix is normal. Vascular/Lymphatic: The abdominal aorta and IVC are unremarkable. No portal venous gas. There is no adenopathy. Reproductive: The uterus is grossly  unremarkable. No suspicious adnexal masses. Other: None Musculoskeletal: Degenerative changes of spine. No acute osseous pathology. IMPRESSION: 1. A 4 mm proximal right ureteral calculus with mild right hydronephrosis. 2. A 1 cm stone in the left renal pelvis. No hydronephrosis on the left. 3. No bowel obstruction. Normal appendix. Electronically Signed   By: Arash  Radparvar  M.D.   On: 06/27/2023 20:17    Procedures Procedures    Medications Ordered in ED Medications  sodium chloride  0.9 % bolus 500 mL (0 mLs Intravenous Stopped 06/27/23 2052)  fentaNYL  (SUBLIMAZE ) injection 50 mcg (50 mcg Intravenous Given 06/27/23 1834)  ondansetron  (ZOFRAN ) injection 4 mg (4 mg Intravenous Given 06/27/23 1834)  morphine  (PF) 4 MG/ML injection 4 mg (4 mg Intravenous Given 06/27/23 1937)  iohexol  (OMNIPAQUE ) 300 MG/ML solution 75 mL (75 mLs Intravenous Contrast Given 06/27/23 1940)  HYDROmorphone  (DILAUDID ) injection 1 mg (1 mg Intravenous Given 06/27/23 2006)  magnesium  sulfate IVPB 2 g 50 mL (0 g Intravenous Stopped 06/27/23 2153)  oxyCODONE -acetaminophen  (PERCOCET/ROXICET) 5-325 MG per tablet 1 tablet (1 tablet Oral Given 06/27/23 2301)  tamsulosin  (FLOMAX ) capsule 0.4 mg (0.4 mg Oral Given 06/27/23 2301)  ondansetron  (ZOFRAN -ODT) disintegrating tablet 4 mg (4 mg Oral Given 06/27/23 2301)    ED Course/ Medical Decision Making/ A&P                                 Medical Decision Making Amount and/or Complexity of Data Reviewed Labs: ordered. Radiology: ordered.  Risk Prescription drug management.    KALEEYAH CUFFIE is a 68 y.o. female with a past medical history significant for hypertension, hypercholesterolemia, GERD, hiatal hernia, previous kidney stones, previous cholecystectomy, depression, and sleep apnea who presents with right side abdominal pain, nausea, and chills.  According to patient, starting today around 1230 she started having pain in her abdomen.  Is primarily in her right abdomen and right lower  quadrant.  She reports nausea but no vomiting.  Reports no constipation or diarrhea.  She denies any trauma.  She reports that she used a different type of workout machine yesterday but did not have musculoskeletal type pain following it.  She reports some nausea but no vomiting.  She reports some chills but no fevers.  She denies any chest pain short of breath or cough.  She reports the pain is moderate to severe in the right lower quadrant and right flank area.  She says it feels different and some of the kidney stone she has had in the past.  Denies any dysuria hematuria or urinary changes to her knowledge.  On exam, lungs clear.  Chest nontender.  Patient is tender in her right lower quadrant and right flank.  Did not have right CVA tenderness on my exam.  Did not have upper abdominal tenderness.  Bowel sounds are normal.  She did have dry mucous membranes on my exam.  Will give some fluids, pain medicine, nausea medicine.  Will get a CT scan given the right lower quadrant tenderness to rule out acute appendicitis kidney stone or right diverticulitis.  Given her previous cholecystectomy have low suspicion for a liver or biliary type problem.  Anticipate reassessment after workup to determine disposition.       9:24 PM CT scan shows a 4 mm stone in the proximal ureter on the right side likely causing her symptoms.  Patient is feeling better after Dilaudid  after she did not have much improvement with fentanyl  or morphine .  Her urine did not show evidence of infection with no nitrites leukocytes or bacteria.  She does have mild leukocytosis similar to prior and similar anemia.  Otherwise her workup is reassuring thus far.  Will try some magnesium  to go along with the Dilaudid  and see how she is feeling.  If she  is feeling better, dissipate discharge for outpatient medical expulsion therapy with pain medicine, nausea, and Flomax  and urology follow-up.  If her symptoms worsen to the point where she cannot  tolerate being at home, will have a shared decision-making conversation about follow-up versus consult urology.  Patient feeling much better after medications.  Lactic acid was slightly elevated however I suspect is due to dehydration and all the vomiting she had.  Given patient's improvement, we feel she is safe for medical expulsion therapy as an outpatient and follow-up with outpatient urology.  Will give a strainer and give her prescription for pain medicine, nausea medicine, Flomax .  Patient agrees to this plan and patient was discharged in good condition for close follow-up.        Final Clinical Impression(s) / ED Diagnoses Final diagnoses:  Kidney stone on right side  Right flank pain    Rx / DC Orders ED Discharge Orders          Ordered    tamsulosin  (FLOMAX ) 0.4 MG CAPS capsule  Daily PRN        06/27/23 2251    oxyCODONE -acetaminophen  (PERCOCET/ROXICET) 5-325 MG tablet  Every 4 hours PRN        06/27/23 2251    ondansetron  (ZOFRAN ) 4 MG tablet  Every 8 hours PRN        06/27/23 2251           Clinical Impression: 1. Kidney stone on right side   2. Right flank pain     Disposition: Discharge  Condition: Good  I have discussed the results, Dx and Tx plan with the pt(& family if present). He/she/they expressed understanding and agree(s) with the plan. Discharge instructions discussed at great length. Strict return precautions discussed and pt &/or family have verbalized understanding of the instructions. No further questions at time of discharge.    New Prescriptions   ONDANSETRON  (ZOFRAN ) 4 MG TABLET    Take 1 tablet (4 mg total) by mouth every 8 (eight) hours as needed for nausea or vomiting.   OXYCODONE -ACETAMINOPHEN  (PERCOCET/ROXICET) 5-325 MG TABLET    Take 1 tablet by mouth every 4 (four) hours as needed for severe pain (pain score 7-10).   TAMSULOSIN  (FLOMAX ) 0.4 MG CAPS CAPSULE    Take 1 capsule (0.4 mg total) by mouth daily as needed (flank pain).     Follow Up: [facility] [address] [place] [address] [place]  [address] [phone]       Lindel Marcell, Lonni PARAS, MD 06/27/23 [phone]

## 2023-06-27 NOTE — ED Notes (Signed)
 Per MD, pt given Diet sprite and crackers

## 2023-06-27 NOTE — Discharge Instructions (Signed)
 Your history, exam, workup today revealed a 4 mm stone in your right ureter causing your right sided abdominal and flank pain tonight.  The urine did not show infection and your labs were similar to prior otherwise.  Given your improvement in symptoms and reassuring vital signs, we feel you are safe for discharge home for medical expulsion therapy as an outpatient.  Please use the pain medicine, nausea medicine, and Flomax  to help pass it and treat your symptoms.  Please call and follow-up with outpatient urology.  As we discussed, if any symptoms change or worsen acutely, return to the nearest emergency department for reevaluation.

## 2023-06-27 NOTE — ED Triage Notes (Signed)
 BIB EMS from home.  Right side pain, reports feels different than her previous kidney stones.  Does not feel like gas pain to her.  Rated 9/10.  VSS  no nausea or vomiting.  Pain started at 14:30

## 2023-06-28 ENCOUNTER — Observation Stay (HOSPITAL_COMMUNITY)
Admission: EM | Admit: 2023-06-28 | Discharge: 2023-06-30 | Disposition: A | Discharge status: Home / Self Care | Payer: Medicare Other | Attending: Internal Medicine | Admitting: Internal Medicine

## 2023-06-28 ENCOUNTER — Emergency Department (HOSPITAL_COMMUNITY): Payer: Medicare Other

## 2023-06-28 DIAGNOSIS — N136 Pyonephrosis: Secondary | ICD-10-CM | POA: Diagnosis present

## 2023-06-28 DIAGNOSIS — R109 Unspecified abdominal pain: Secondary | ICD-10-CM | POA: Diagnosis present

## 2023-06-28 DIAGNOSIS — G4733 Obstructive sleep apnea (adult) (pediatric): Secondary | ICD-10-CM | POA: Diagnosis present

## 2023-06-28 DIAGNOSIS — Z83438 Family history of other disorder of lipoprotein metabolism and other lipidemia: Secondary | ICD-10-CM

## 2023-06-28 DIAGNOSIS — K449 Diaphragmatic hernia without obstruction or gangrene: Secondary | ICD-10-CM | POA: Insufficient documentation

## 2023-06-28 DIAGNOSIS — N1330 Unspecified hydronephrosis: Secondary | ICD-10-CM

## 2023-06-28 DIAGNOSIS — Z7984 Long term (current) use of oral hypoglycemic drugs: Secondary | ICD-10-CM

## 2023-06-28 DIAGNOSIS — Z8249 Family history of ischemic heart disease and other diseases of the circulatory system: Secondary | ICD-10-CM | POA: Diagnosis not present

## 2023-06-28 DIAGNOSIS — D649 Anemia, unspecified: Secondary | ICD-10-CM | POA: Diagnosis present

## 2023-06-28 DIAGNOSIS — E785 Hyperlipidemia, unspecified: Secondary | ICD-10-CM

## 2023-06-28 DIAGNOSIS — E1140 Type 2 diabetes mellitus with diabetic neuropathy, unspecified: Secondary | ICD-10-CM | POA: Diagnosis present

## 2023-06-28 DIAGNOSIS — Z833 Family history of diabetes mellitus: Secondary | ICD-10-CM

## 2023-06-28 DIAGNOSIS — Z79899 Other long term (current) drug therapy: Secondary | ICD-10-CM

## 2023-06-28 DIAGNOSIS — N200 Calculus of kidney: Principal | ICD-10-CM

## 2023-06-28 DIAGNOSIS — N390 Urinary tract infection, site not specified: Secondary | ICD-10-CM | POA: Insufficient documentation

## 2023-06-28 DIAGNOSIS — Z87442 Personal history of urinary calculi: Secondary | ICD-10-CM

## 2023-06-28 DIAGNOSIS — Z888 Allergy status to other drugs, medicaments and biological substances status: Secondary | ICD-10-CM

## 2023-06-28 DIAGNOSIS — E7800 Pure hypercholesterolemia, unspecified: Secondary | ICD-10-CM | POA: Diagnosis present

## 2023-06-28 DIAGNOSIS — I10 Essential (primary) hypertension: Secondary | ICD-10-CM | POA: Diagnosis present

## 2023-06-28 DIAGNOSIS — N202 Calculus of kidney with calculus of ureter: Secondary | ICD-10-CM | POA: Diagnosis not present

## 2023-06-28 DIAGNOSIS — E119 Type 2 diabetes mellitus without complications: Secondary | ICD-10-CM | POA: Diagnosis not present

## 2023-06-28 DIAGNOSIS — N179 Acute kidney failure, unspecified: Secondary | ICD-10-CM | POA: Diagnosis present

## 2023-06-28 DIAGNOSIS — F325 Major depressive disorder, single episode, in full remission: Secondary | ICD-10-CM | POA: Diagnosis present

## 2023-06-28 DIAGNOSIS — E872 Acidosis, unspecified: Secondary | ICD-10-CM | POA: Diagnosis present

## 2023-06-28 DIAGNOSIS — N132 Hydronephrosis with renal and ureteral calculous obstruction: Principal | ICD-10-CM | POA: Insufficient documentation

## 2023-06-28 DIAGNOSIS — E1165 Type 2 diabetes mellitus with hyperglycemia: Secondary | ICD-10-CM | POA: Diagnosis present

## 2023-06-28 DIAGNOSIS — Z886 Allergy status to analgesic agent status: Secondary | ICD-10-CM | POA: Diagnosis not present

## 2023-06-28 DIAGNOSIS — A4181 Sepsis due to Enterococcus: Secondary | ICD-10-CM | POA: Diagnosis present

## 2023-06-28 DIAGNOSIS — Z803 Family history of malignant neoplasm of breast: Secondary | ICD-10-CM | POA: Diagnosis not present

## 2023-06-28 DIAGNOSIS — F419 Anxiety disorder, unspecified: Secondary | ICD-10-CM | POA: Diagnosis present

## 2023-06-28 DIAGNOSIS — Z9049 Acquired absence of other specified parts of digestive tract: Secondary | ICD-10-CM

## 2023-06-28 DIAGNOSIS — K219 Gastro-esophageal reflux disease without esophagitis: Secondary | ICD-10-CM | POA: Diagnosis present

## 2023-06-28 DIAGNOSIS — Z794 Long term (current) use of insulin: Secondary | ICD-10-CM

## 2023-06-28 DIAGNOSIS — Z885 Allergy status to narcotic agent status: Secondary | ICD-10-CM

## 2023-06-28 LAB — URINALYSIS, ROUTINE W REFLEX MICROSCOPIC
Bacteria, UA: NONE SEEN
Bilirubin Urine: NEGATIVE
Glucose, UA: 50 mg/dL — AB
Ketones, ur: NEGATIVE mg/dL
Leukocytes,Ua: NEGATIVE
Nitrite: NEGATIVE
Protein, ur: 30 mg/dL — AB
RBC / HPF: >50 RBC/hpf (ref 0–5)
Specific Gravity, Urine: 1.042 — ABNORMAL HIGH (ref 1.005–1.030)
pH: 5 (ref 5.0–8.0)

## 2023-06-28 LAB — CBC WITH DIFFERENTIAL/PLATELET
Abs Immature Granulocytes: 0.07 10*3/uL (ref 0.00–0.07)
Basophils Absolute: 0.1 10*3/uL (ref 0.0–0.1)
Basophils Relative: 1 %
Eosinophils Absolute: 0.3 10*3/uL (ref 0.0–0.5)
Eosinophils Relative: 2 %
HCT: 33.8 % — ABNORMAL LOW (ref 36.0–46.0)
Hemoglobin: 10.7 g/dL — ABNORMAL LOW (ref 12.0–15.0)
Immature Granulocytes: 1 %
Lymphocytes Relative: 18 %
Lymphs Abs: 2.8 10*3/uL (ref 0.7–4.0)
MCH: 30 pg (ref 26.0–34.0)
MCHC: 31.7 g/dL (ref 30.0–36.0)
MCV: 94.7 fL (ref 80.0–100.0)
Monocytes Absolute: 1.1 10*3/uL — ABNORMAL HIGH (ref 0.1–1.0)
Monocytes Relative: 7 %
Neutro Abs: 10.9 10*3/uL — ABNORMAL HIGH (ref 1.7–7.7)
Neutrophils Relative %: 71 %
Platelets: 410 10*3/uL — ABNORMAL HIGH (ref 150–400)
RBC: 3.57 MIL/uL — ABNORMAL LOW (ref 3.87–5.11)
RDW: 12.5 % (ref 11.5–15.5)
WBC: 15.2 10*3/uL — ABNORMAL HIGH (ref 4.0–10.5)
nRBC: 0 % (ref 0.0–0.2)

## 2023-06-28 LAB — COMPREHENSIVE METABOLIC PANEL
ALT: 20 U/L (ref 0–44)
AST: 24 U/L (ref 15–41)
Albumin: 3.6 g/dL (ref 3.5–5.0)
Alkaline Phosphatase: 77 U/L (ref 38–126)
Anion gap: 11 (ref 5–15)
BUN: 24 mg/dL — ABNORMAL HIGH (ref 8–23)
CO2: 20 mmol/L — ABNORMAL LOW (ref 22–32)
Calcium: 9 mg/dL (ref 8.9–10.3)
Chloride: 105 mmol/L (ref 98–111)
Creatinine, Ser: 1.81 mg/dL — ABNORMAL HIGH (ref 0.44–1.00)
GFR, Estimated: 30 mL/min — ABNORMAL LOW (ref >60)
Glucose, Bld: 289 mg/dL — ABNORMAL HIGH (ref 70–99)
Potassium: 4.5 mmol/L (ref 3.5–5.1)
Sodium: 136 mmol/L (ref 135–145)
Total Bilirubin: 0.7 mg/dL (ref 0.0–1.2)
Total Protein: 7 g/dL (ref 6.5–8.1)

## 2023-06-28 LAB — CBG MONITORING, ED: Glucose-Capillary: 154 mg/dL — ABNORMAL HIGH (ref 70–99)

## 2023-06-28 LAB — LIPASE, BLOOD: Lipase: 34 U/L (ref 11–51)

## 2023-06-28 MED ORDER — INSULIN GLARGINE-YFGN 100 UNIT/ML ~~LOC~~ SOLN
7.0000 [IU] | Freq: Every day | SUBCUTANEOUS | Status: DC
  Administered 2023-06-30: 7 [IU] via SUBCUTANEOUS
  Filled 2023-06-28 (×2): qty 0.07

## 2023-06-28 MED ORDER — PANTOPRAZOLE SODIUM 40 MG PO TBEC
40.0000 mg | DELAYED_RELEASE_TABLET | Freq: Every day | ORAL | Status: DC
  Administered 2023-06-29 – 2023-06-30 (×2): 40 mg via ORAL
  Filled 2023-06-28 (×2): qty 1

## 2023-06-28 MED ORDER — ACETAMINOPHEN 325 MG PO TABS
650.0000 mg | ORAL_TABLET | Freq: Four times a day (QID) | ORAL | Status: DC | PRN
  Administered 2023-06-28 – 2023-06-30 (×4): 650 mg via ORAL
  Filled 2023-06-28 (×4): qty 2

## 2023-06-28 MED ORDER — ACETAMINOPHEN 650 MG RE SUPP: 650.0000 mg | Freq: Four times a day (QID) | RECTAL | Status: DC | PRN

## 2023-06-28 MED ORDER — BUPROPION HCL ER (XL) 300 MG PO TB24
300.0000 mg | ORAL_TABLET | Freq: Every day | ORAL | Status: DC
  Administered 2023-06-29 – 2023-06-30 (×2): 300 mg via ORAL
  Filled 2023-06-28: qty 2
  Filled 2023-06-28: qty 1

## 2023-06-28 MED ORDER — ATORVASTATIN CALCIUM 40 MG PO TABS
40.0000 mg | ORAL_TABLET | Freq: Every day | ORAL | Status: DC
  Administered 2023-06-28 – 2023-06-29 (×2): 40 mg via ORAL
  Filled 2023-06-28 (×2): qty 1

## 2023-06-28 MED ORDER — TAMSULOSIN HCL 0.4 MG PO CAPS
0.4000 mg | ORAL_CAPSULE | Freq: Every evening | ORAL | Status: DC
  Filled 2023-06-28: qty 1

## 2023-06-28 MED ORDER — ENOXAPARIN SODIUM 40 MG/0.4ML IJ SOSY
40.0000 mg | PREFILLED_SYRINGE | INTRAMUSCULAR | Status: DC
  Administered 2023-06-29 – 2023-06-30 (×2): 40 mg via SUBCUTANEOUS
  Filled 2023-06-28 (×2): qty 0.4

## 2023-06-28 MED ORDER — ZOLPIDEM TARTRATE 5 MG PO TABS: 5.0000 mg | ORAL_TABLET | Freq: Every evening | ORAL | Status: DC | PRN

## 2023-06-28 MED ORDER — SODIUM CHLORIDE 0.9 % IV SOLN: 1.0000 g | INTRAVENOUS | Status: DC

## 2023-06-28 MED ORDER — HYDROMORPHONE HCL 1 MG/ML IJ SOLN
1.0000 mg | Freq: Once | INTRAMUSCULAR | Status: AC
  Administered 2023-06-28: 1 mg via INTRAVENOUS
  Filled 2023-06-28: qty 1

## 2023-06-28 MED ORDER — TAMSULOSIN HCL 0.4 MG PO CAPS
0.4000 mg | ORAL_CAPSULE | Freq: Every evening | ORAL | Status: DC
  Administered 2023-06-29: 0.4 mg via ORAL
  Filled 2023-06-28: qty 1

## 2023-06-28 MED ORDER — SENNOSIDES-DOCUSATE SODIUM 8.6-50 MG PO TABS: 1.0000 | ORAL_TABLET | Freq: Every evening | ORAL | Status: DC | PRN

## 2023-06-28 MED ORDER — VENLAFAXINE HCL ER 150 MG PO CP24
150.0000 mg | ORAL_CAPSULE | Freq: Every day | ORAL | Status: DC
  Administered 2023-06-29 – 2023-06-30 (×2): 150 mg via ORAL
  Filled 2023-06-28: qty 2
  Filled 2023-06-28: qty 1

## 2023-06-28 MED ORDER — INSULIN ASPART 100 UNIT/ML IJ SOLN
0.0000 [IU] | Freq: Three times a day (TID) | INTRAMUSCULAR | Status: DC
  Administered 2023-06-29 (×2): 2 [IU] via SUBCUTANEOUS
  Administered 2023-06-30: 3 [IU] via SUBCUTANEOUS
  Filled 2023-06-28: qty 0.15

## 2023-06-28 MED ORDER — ONDANSETRON HCL 4 MG PO TABS: 4.0000 mg | ORAL_TABLET | Freq: Four times a day (QID) | ORAL | Status: DC | PRN

## 2023-06-28 MED ORDER — HYDROMORPHONE HCL 1 MG/ML IJ SOLN: 0.5000 mg | INTRAMUSCULAR | Status: DC | PRN

## 2023-06-28 MED ORDER — SODIUM CHLORIDE 0.9 % IV SOLN
1.0000 g | INTRAVENOUS | Status: DC
  Administered 2023-06-28: 1 g via INTRAVENOUS
  Filled 2023-06-28: qty 10

## 2023-06-28 MED ORDER — HYDROCODONE-ACETAMINOPHEN 5-325 MG PO TABS
1.0000 | ORAL_TABLET | Freq: Once | ORAL | Status: AC
  Administered 2023-06-29: 1 via ORAL
  Filled 2023-06-28 (×2): qty 1

## 2023-06-28 MED ORDER — FENTANYL CITRATE PF 50 MCG/ML IJ SOSY
25.0000 ug | PREFILLED_SYRINGE | Freq: Once | INTRAMUSCULAR | Status: AC
  Administered 2023-06-28: 25 ug via INTRAVENOUS
  Filled 2023-06-28: qty 1

## 2023-06-28 MED ORDER — ONDANSETRON 8 MG PO TBDP
8.0000 mg | ORAL_TABLET | Freq: Once | ORAL | Status: AC
  Administered 2023-06-28: 8 mg via ORAL
  Filled 2023-06-28: qty 1

## 2023-06-28 MED ORDER — INSULIN ASPART 100 UNIT/ML IJ SOLN
0.0000 [IU] | Freq: Every day | INTRAMUSCULAR | Status: DC
  Filled 2023-06-28: qty 0.05

## 2023-06-28 MED ORDER — INSULIN GLARGINE-YFGN 100 UNIT/ML ~~LOC~~ SOLN
14.0000 [IU] | Freq: Every day | SUBCUTANEOUS | Status: DC
  Filled 2023-06-28: qty 0.14

## 2023-06-28 MED ORDER — HYDRALAZINE HCL 20 MG/ML IJ SOLN: 5.0000 mg | INTRAMUSCULAR | Status: DC | PRN

## 2023-06-28 MED ORDER — CLONAZEPAM 1 MG PO TABS
1.0000 mg | ORAL_TABLET | Freq: Every day | ORAL | Status: DC | PRN
Start: 2023-06-28 — End: 2023-06-30

## 2023-06-28 MED ORDER — ONDANSETRON HCL 4 MG/2ML IJ SOLN: 4.0000 mg | Freq: Four times a day (QID) | INTRAMUSCULAR | Status: DC | PRN

## 2023-06-28 NOTE — ED Notes (Signed)
 Hemoglobin A1C added on by lab at this time

## 2023-06-28 NOTE — ED Provider Triage Note (Signed)
 Emergency Medicine Provider Triage Evaluation Note  Mindy Richard , a 68 y.o. female  was evaluated in triage.  Pt complains of right flank pain.  Was diagnosed with kidney stone last night.  Pain came back 2 hours ago.  Home pain medication is not helping..  Review of Systems  Positive: As above Negative: As above  Physical Exam  BP 138/81 (BP Location: Left Arm)   Pulse 84   Temp 98.4 F (36.9 C) (Oral)   Resp 18   SpO2 96%  Gen:   Awake, no distress   Resp:  Normal effort  MSK:   Moves extremities without difficulty  Other:    Medical Decision Making  Medically screening exam initiated at 7:59 PM.  Appropriate orders placed.  Tracie B Buckalew was informed that the remainder of the evaluation will be completed by another provider, this initial triage assessment does not replace that evaluation, and the importance of remaining in the ED until their evaluation is complete.    Hildegard Loge, PA-C 06/28/23 [phone]

## 2023-06-28 NOTE — Consult Note (Signed)
 Urology Consult Note   Requesting Attending Physician:  Tegeler, Lonni PARAS, * Service Providing Consult: Urology  Consulting Attending: Sherwood Edison  Reason for Consult:  Nephrolithiasis  HPI: Mindy Richard is seen in consultation for reasons noted above at the request of Tegeler, Lonni PARAS, * for evalaution of bilateral stone burden.   This is a 68 y.o. female with past medical history significant for anxiety, depression, nephrolithiasis, who presented to the ED over the weekend and was found to have bilateral stone burden.  She was sent home by the emergency department on medical expulsion therapy.  She returns today given poor pain control.  Her CT scan from yesterday shows a 5 mm right proximal stone, and a left 1 cm left UPJ stone.  Her white blood cell count is at 15.  Her urinalysis is not overtly concerning for infection, with 11-20 white blood cells, greater than 50 red blood cells, and no bacterial seen, LE and nitrite negative.   Her kidney function is decreased, with creatinine at 1.8, up from 1.2 over the weekend.  Urology consulted given failed MET.    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   Wears glasses     Past Surgical History:  Past Surgical History:  Procedure Laterality Date   CARPAL TUNNEL RELEASE Bilateral    left 2017;  right 2001   CESAREAN SECTION  1985   CHOLECYSTECTOMY  09/02/2011   Procedure: LAPAROSCOPIC  CHOLECYSTECTOMY WITH INTRAOPERATIVE CHOLANGIOGRAM;  Surgeon: Sherlean PARAS Laughter, MD;  Location: [facility] OR;  Service: General;  Laterality: N/A;   COLONOSCOPY     CYSTOSCOPY/URETEROSCOPY/HOLMIUM LASER/STENT PLACEMENT Left 05/08/2021   Procedure: LEFT /URETEROSCOPY/HOLMIUM LASER/STENT PLACEMENT;  Surgeon: Lovie Arlyss CROME, MD;  Location: [facility];  Service: Urology;  Laterality: Left;   DILATION AND CURETTAGE OF UTERUS  1984   ERCP  09/03/2011   Procedure: ENDOSCOPIC RETROGRADE CHOLANGIOPANCREATOGRAPHY (ERCP);  Surgeon: Norleen JAYSON Hint, MD;  Location: [facility] ENDOSCOPY;  Service: Endoscopy;  Laterality: N/A;  sphincterotomy and stone extraction   SHOULDER ARTHROSCOPY WITH SUBACROMIAL DECOMPRESSION Left 10/02/2010   @[facility]  by dr amy;   w/ closed manipulation   TONSILLECTOMY  1970   TUBAL LIGATION Bilateral 1985    Medication: Current Facility-Administered Medications  Medication Dose Route Frequency Provider Last Rate Last Admin   HYDROcodone -acetaminophen  (NORCO/VICODIN) 5-325 MG per tablet 1 tablet  1 tablet Oral Once Hildegard Loge, PA-C       Current Outpatient Medications  Medication Sig Dispense Refill   atorvastatin  (LIPITOR) 40 MG tablet Take 1 tablet (40 mg total) by mouth daily. (Patient taking differently: Take 40 mg by mouth at bedtime.) 90 tablet 2   buPROPion  (WELLBUTRIN  XL) 300 MG 24 hr tablet Take 300 mg by mouth daily.      Cholecalciferol  (VITAMIN D ) 2000 units tablet Take 2,000 Units by mouth daily  with breakfast.     clonazePAM  (KLONOPIN ) 1 MG tablet Take 1 mg by mouth as needed for anxiety.     diclofenac  sodium (VOLTAREN ) 1 % GEL Apply 2 g topically 4 (four) times daily. (Patient taking differently: Apply 2 g topically 4 (four) times daily as needed (for pain).) 100 g 2   ferrous sulfate 325 (65 FE) MG tablet Take 325 mg by mouth daily with breakfast. M/ W/ F     LANTUS  SOLOSTAR 100 UNIT/ML Solostar Pen INJECT 8 UNITS SUBCUTANEOUSLY EVERY DAY AS DIRECTED OR AS  DIRECTED BY PRIMARY CARE PHYSICIAN DISCARD PEN 28 DAYS AFTER OPENING (Patient taking differently: Inject 14 Units into the skin at bedtime.) 15 mL 2   losartan  (COZAAR ) 100 MG tablet TAKE 1/2 TO 1 TABLET DAILY AS DIRECTED FOR BLOOD PRESSURE AND KIDNEY PROTECTION (Patient taking differently: Take 50 mg by mouth daily.) 90 tablet 4   metFORMIN  (GLUCOPHAGE -XR) 500 MG 24 hr tablet Take 1,000 mg by mouth 2 (two) times daily.     mirtazapine (REMERON) 15 MG tablet Take 7.5 mg by mouth at bedtime.     ondansetron  (ZOFRAN -ODT) 4 MG disintegrating tablet Take 1 tablet (4 mg total) by mouth every 8 (eight) hours as needed for nausea. 10 tablet 0   oxyCODONE -acetaminophen  (PERCOCET) 5-325 MG tablet Take 1 tablet by mouth every 4 (four) hours as needed. 20 tablet 0   pantoprazole  (PROTONIX ) 40 MG tablet Take 40 mg by mouth daily.     tamsulosin  (FLOMAX ) 0.4 MG CAPS capsule Take 1 capsule (0.4 mg total) by mouth daily after supper. 10 capsule 0   venlafaxine  XR (EFFEXOR -XR) 150 MG 24 hr capsule Take 150 mg by mouth daily with breakfast.      zolpidem  (AMBIEN ) 10 MG tablet Take 10 mg by mouth at bedtime.      amoxicillin (AMOXIL) 500 MG capsule Take 500 mg by mouth 3 (three) times daily. (Patient not taking: Reported on 06/28/2023)     blood glucose meter kit and supplies Check sugar 3 times a day. DxE11.9Dispense based insurance preference. 1 each 0   cephALEXin  (KEFLEX ) 500 MG capsule Take 1 capsule (500 mg total) by mouth 2 (two) times daily. (Patient not taking: Reported on 06/28/2023) 10 capsule 0   DROPLET PEN NEEDLES 31G X 8 MM MISC USE AS DIRECTED  WITH  LANTUS   INJECTIONS 100 each 3   gabapentin  (NEURONTIN ) 100 MG capsule Take 1 capsule (100 mg total) by mouth 3 (three) times daily. (Patient not taking: Reported on 05/06/2021) 90 capsule 2   glucose blood test strip One touch Test blood sugar TID PRN DX insulin  dependent diabetic E11.9 100 each 12   hydrocortisone  (PROCTO-PAK) 1 % CREA Apply once or twice  daily as needed for hemorrhoids (Patient not taking: Reported on 05/06/2021) 28.35 g 3   ondansetron  (ZOFRAN ) 4 MG tablet Take 1 tablet (4 mg total) by mouth every 8 (eight) hours as needed for nausea or vomiting. (Patient not taking: Reported on 06/28/2023) 12 tablet 0   oxybutynin  (DITROPAN ) 5 MG tablet Take 1 tablet (5 mg total) by mouth 3 (three) times daily as needed for bladder spasms. (Patient not taking: Reported on 06/28/2023) 21 tablet 1   oxyCODONE -acetaminophen  (PERCOCET/ROXICET) 5-325 MG tablet Take 1 tablet by mouth every 4 (four) hours as needed for severe pain (pain score 7-10). 15 tablet 0   Saxagliptin -Metformin  (KOMBIGLYZE XR) 2.09-998 MG TB24 TAKE 1 TABLET TWICE DAILY WITH A MEAL (Patient not taking: Reported on 06/28/2023)  30 tablet 0   tamsulosin  (FLOMAX ) 0.4 MG CAPS capsule Take 1 capsule (0.4 mg total) by mouth daily as needed (flank pain). 14 capsule 0   triamcinolone  ointment (KENALOG ) 0.1 % Apply 1 application topically 2 (two) times daily. (Patient not taking: Reported on 05/06/2021) 80 g 1    Allergies: Allergies  Allergen Reactions   Codeine Other (See Comments)    Makes her head feel weird   Glucophage  [Metformin  Hcl] Diarrhea   Nsaids Other (See Comments)    upset stomach    Social History: Social History   Tobacco Use   Smoking status: Never   Smokeless tobacco: Never  Vaping Use   Vaping status: Never Used  Substance Use Topics   Alcohol use: No   Drug use: Never    Family History Family History  Problem Relation Age of Onset   Cancer Maternal Aunt        breast   Heart attack Father 9       Smoker   Diabetes Mother    Hyperlipidemia Mother    Anesthesia problems Neg Hx    Hypotension Neg Hx     Review of Systems 10 systems were reviewed and are negative except as noted specifically in the HPI.  Objective   Vital signs in last 24 hours: BP 138/81 (BP Location: Left Arm)   Pulse 84   Temp 98.4 F (36.9 C) (Oral)   Resp 18   SpO2  96%   Physical Exam General: NAD, A&O, resting, appropriate HEENT: [place]/AT, EOMI, MMM Pulmonary: Normal work of breathing Cardiovascular: HDS, adequate peripheral perfusion Abdomen: Soft, NTTP, nondistended, Nontender. GU: VS, Mild CVA tenderness Extremities: warm and well perfused Neuro: Appropriate, no focal neurological deficits  Most Recent Labs: Lab Results  Component Value Date   WBC 15.2 (H) 06/28/2023   HGB 10.7 (L) 06/28/2023   HCT 33.8 (L) 06/28/2023   PLT 410 (H) 06/28/2023    Lab Results  Component Value Date   NA 136 06/28/2023   K 4.5 06/28/2023   CL 105 06/28/2023   CO2 20 (L) 06/28/2023   BUN 24 (H) 06/28/2023   CREATININE 1.81 (H) 06/28/2023   CALCIUM  9.0 06/28/2023   MG 1.8 12/29/2017    Lab Results  Component Value Date   INR 0.91 05/09/2011   APTT 24 05/09/2011     Urine Culture: @[id number] (laburin,org,[id number])@   IMAGING: US  RENAL Result Date: 06/28/2023 CLINICAL DATA:  Right flank pain. EXAM: RENAL / URINARY TRACT ULTRASOUND COMPLETE COMPARISON:  None Available. FINDINGS: Right Kidney: Renal measurements: 10.3 cm x 4.8 cm x 5.8 cm = volume: 148.7 mL. Echogenicity within normal limits. No mass is visualized. There is mild right-sided hydronephrosis. Left Kidney: Renal measurements: 9.2 cm x 4.7 cm x 5.5 cm = volume: 124.5 mL. Echogenicity within normal limits. No mass or hydronephrosis visualized. Bladder: The urinary bladder is empty and subsequently limited in evaluation. Other: None. IMPRESSION: Mild right-sided hydronephrosis. Electronically Signed   By: Suzen Dials M.D.   On: 06/28/2023 22:21   CT ABDOMEN PELVIS W CONTRAST Result Date: 06/27/2023 CLINICAL DATA:  Right lower quadrant abdominal pain. EXAM: CT ABDOMEN AND PELVIS WITH CONTRAST TECHNIQUE: Multidetector CT imaging of the abdomen and pelvis was performed using the standard protocol following bolus administration of intravenous contrast. RADIATION DOSE REDUCTION: This exam was  performed according to the departmental dose-optimization program which includes automated exposure control, adjustment of the mA and/or kV according to patient size and/or use of iterative reconstruction  technique. CONTRAST:  75mL OMNIPAQUE  IOHEXOL  300 MG/ML  SOLN COMPARISON:  CT abdomen pelvis dated 05/30/2023. FINDINGS: Lower chest: The visualized lung bases are clear. No intra-abdominal free air or free fluid. Hepatobiliary: The liver is unremarkable. No biliary dilatation. Cholecystectomy. Pancreas: Unremarkable. No pancreatic ductal dilatation or surrounding inflammatory changes. Spleen: Normal in size without focal abnormality. Adrenals/Urinary Tract: The adrenal glands are unremarkable. The 4 mm stone seen in the inferior pole of the right kidney on prior CT is not located in the proximal right ureter. There is mild right hydronephrosis. A 1 cm stone in the upper pole of the left kidney on the prior CT is not located in the left renal pelvis. There is mild left pelviectasis without hydronephrosis. The urinary bladder is collapsed. Stomach/Bowel: There is no bowel obstruction or active inflammation. The appendix is normal. Vascular/Lymphatic: The abdominal aorta and IVC are unremarkable. No portal venous gas. There is no adenopathy. Reproductive: The uterus is grossly unremarkable. No suspicious adnexal masses. Other: None Musculoskeletal: Degenerative changes of spine. No acute osseous pathology. IMPRESSION: 1. A 4 mm proximal right ureteral calculus with mild right hydronephrosis. 2. A 1 cm stone in the left renal pelvis. No hydronephrosis on the left. 3. No bowel obstruction. Normal appendix. Electronically Signed   By: Vanetta Chou M.D.   On: 06/27/2023 20:17    ------  Assessment:  68 y.o. female with hyperlipidemia, hypertension, and bilateral nephrolithiasis who presents to the emergency department after failed medical expulsion therapy of the right proximal stone, and a subsequent left 1 cm  renal pelvis stone  White count is mildly elevated compared to her emergency department visit over this weekend, white blood cell count currently at 15.  She also has a bit more of an AKI, with creatinine of 1.8.  Reassuringly, she is hemodynamically stable without signs of infection.  Given poor pain control and concern for bilateral ureteral obstruction, will plan for interval bilateral stent placement and laser lithotripsy.   Recommendations: - Recommend admission to medicine team - please ensure pt NPO @ [facility]  - Case posted, will add-on for 06/29/2023 - Please send dedicated urine culture   Lynwood LELON Decamp, MD Resident Physician [facility]    Thank you for this consult. Please contact the urology consult pager with any further questions/concerns.

## 2023-06-28 NOTE — ED Notes (Signed)
 Provider at bedside speaking with patient at this time

## 2023-06-28 NOTE — ED Notes (Signed)
 Patient resting in hall bed at this time with husband present. Offered patient warm blankets and she politely refused at this time.

## 2023-06-28 NOTE — H&P (Addendum)
 PCP:   Judyth Isaiah Bottcher, DO   Chief Complaint:  Right-sided abdominal and flank pain  HPI: This is a 68 year old female with past medical history significant for nephrolithiasis, HTN, HLD, T2DM, OSA, depression and anxiety.  Patient was in the ER 1/11 with abdominal, right sided flank pain, nausea and vomiting.  The she was diagnosed with 4 mm right-sided nephrolithiasis with mild hydronephrosis.  She was discharged on Flomax , Zofran  and oxycodone .  Her pain remains uncontrolled, she returns to the ER with complaints of uncontrolled pain, nausea and vomiting.    In the ER patient hemodynamically stable, afebrile.  Glucose 289.  Creatinine 1.81, up from 1.16 yesterday.  Baseline normal.  WBC 15.2, previous 12.  Patient with elevated WBCs since 2020.  Baseline 12.  UA positive for hemoglobin. Renal ultrasound: Mild right-sided hydronephrosis EDP contacted urologist Dr. Fleeta.  Will see patient in a.m.  Review of Systems:  Per HPI  Past Medical History: Past Medical History:  Diagnosis Date   Anxiety    Depression    GERD (gastroesophageal reflux disease)    Hiatal hernia    History of kidney stones    Hypercholesteremia    Hypertension    Insulin -requiring or dependent type II diabetes mellitus (HCC)    followed by pcp  (05-06-2021  pt stated check blood sugar daily fasting in am--- fasting sugar--  140-170)   Left ureteral stone    Mild obstructive sleep apnea    followed by dr jude---  (05-06-2021  pt stated has not used cpap >66yrs) study in epic very mild osa with delayed sleep phase syndrome   Wears glasses    Past Surgical History:  Procedure Laterality Date   CARPAL TUNNEL RELEASE Bilateral    left 2017;  right 2001   CESAREAN SECTION  1985   CHOLECYSTECTOMY  09/02/2011   Procedure: LAPAROSCOPIC CHOLECYSTECTOMY WITH INTRAOPERATIVE CHOLANGIOGRAM;  Surgeon: Sherlean JINNY Laughter, MD;  Location: [facility] OR;  Service: General;  Laterality: N/A;   COLONOSCOPY      CYSTOSCOPY/URETEROSCOPY/HOLMIUM LASER/STENT PLACEMENT Left 05/08/2021   Procedure: LEFT /URETEROSCOPY/HOLMIUM LASER/STENT PLACEMENT;  Surgeon: Lovie Arlyss CROME, MD;  Location: [facility];  Service: Urology;  Laterality: Left;   DILATION AND CURETTAGE OF UTERUS  1984   ERCP  09/03/2011   Procedure: ENDOSCOPIC RETROGRADE CHOLANGIOPANCREATOGRAPHY (ERCP);  Surgeon: Norleen JAYSON Hint, MD;  Location: [facility] ENDOSCOPY;  Service: Endoscopy;  Laterality: N/A;  sphincterotomy and stone extraction   SHOULDER ARTHROSCOPY WITH SUBACROMIAL DECOMPRESSION Left 10/02/2010   @[facility]  by dr amy;   w/ closed manipulation   TONSILLECTOMY  1970   TUBAL LIGATION Bilateral 1985    Medications: Prior to Admission medications   Medication Sig Start Date End Date Taking? Authorizing Provider  atorvastatin  (LIPITOR) 40 MG tablet Take 1 tablet (40 mg total) by mouth daily. Patient taking differently: Take 40 mg by mouth at bedtime. 09/08/17 06/28/23 Yes Craig Palma R, PA-C  buPROPion  (WELLBUTRIN  XL) 300 MG 24 hr tablet Take 300 mg by mouth daily.  05/21/14  Yes [provider]  Cholecalciferol  (VITAMIN D ) 2000 units tablet Take 2,000 Units by mouth daily with breakfast.   Yes [provider]  clonazePAM  (KLONOPIN ) 1 MG tablet Take 1 mg by mouth as needed for anxiety.   Yes [provider]  diclofenac  sodium (VOLTAREN ) 1 % GEL Apply 2 g topically 4 (four) times daily. Patient taking differently: Apply 2 g topically 4 (four) times daily as needed (for pain). 02/04/16  Yes McKeown,  Elsie, MD  ferrous sulfate 325 (65 FE) MG tablet Take 325 mg by mouth daily with breakfast. M/ W/ F   Yes [provider]  LANTUS  SOLOSTAR 100 UNIT/ML Solostar Pen INJECT 8 UNITS SUBCUTANEOUSLY EVERY DAY AS DIRECTED OR AS DIRECTED BY PRIMARY CARE PHYSICIAN DISCARD PEN 28 DAYS AFTER OPENING Patient taking differently: Inject 14 Units into the skin at bedtime. 01/12/18  Yes Tonita Elsie, MD  losartan   (COZAAR ) 100 MG tablet TAKE 1/2 TO 1 TABLET DAILY AS DIRECTED FOR BLOOD PRESSURE AND KIDNEY PROTECTION Patient taking differently: Take 50 mg by mouth daily. 12/03/16  Yes Tonita Elsie, MD  metFORMIN  (GLUCOPHAGE -XR) 500 MG 24 hr tablet Take 1,000 mg by mouth 2 (two) times daily. 04/28/23  Yes [provider]  mirtazapine (REMERON) 15 MG tablet Take 7.5 mg by mouth at bedtime.   Yes [provider]  ondansetron  (ZOFRAN -ODT) 4 MG disintegrating tablet Take 1 tablet (4 mg total) by mouth every 8 (eight) hours as needed for nausea. 05/30/23  Yes Jarold Olam HERO, PA-C  oxyCODONE -acetaminophen  (PERCOCET) 5-325 MG tablet Take 1 tablet by mouth every 4 (four) hours as needed. 05/30/23  Yes Jarold Olam HERO, PA-C  pantoprazole  (PROTONIX ) 40 MG tablet Take 40 mg by mouth daily.   Yes [provider]  tamsulosin  (FLOMAX ) 0.4 MG CAPS capsule Take 1 capsule (0.4 mg total) by mouth daily after supper. 05/30/23  Yes Jarold Olam HERO, PA-C  venlafaxine  XR (EFFEXOR -XR) 150 MG 24 hr capsule Take 150 mg by mouth daily with breakfast.  05/21/14  Yes [provider]  zolpidem  (AMBIEN ) 10 MG tablet Take 10 mg by mouth at bedtime.  05/06/11  Yes [provider]  amoxicillin (AMOXIL) 500 MG capsule Take 500 mg by mouth 3 (three) times daily. Patient not taking: Reported on 06/28/2023 06/03/23   [provider]  blood glucose meter kit and supplies Check sugar 3 times a day. DxE11.9Dispense based insurance preference. 09/11/17   Craig Alan SAUNDERS, PA-C  cephALEXin  (KEFLEX ) 500 MG capsule Take 1 capsule (500 mg total) by mouth 2 (two) times daily. Patient not taking: Reported on 06/28/2023 05/08/21   Lovie Arlyss CROME, MD  DROPLET PEN NEEDLES 31G X 8 MM MISC USE AS DIRECTED  WITH  LANTUS   INJECTIONS 03/08/18   Tonita Elsie, MD  glucose blood test strip One touch Test blood sugar TID PRN DX insulin  dependent diabetic E11.9 09/09/17   Craig Alan SAUNDERS, PA-C   oxyCODONE -acetaminophen  (PERCOCET/ROXICET) 5-325 MG tablet Take 1 tablet by mouth every 4 (four) hours as needed for severe pain (pain score 7-10). 06/27/23   Tegeler, Lonni PARAS, MD    Allergies:   Allergies  Allergen Reactions   Codeine Other (See Comments)    Makes her head feel weird   Glucophage  [Metformin  Hcl] Diarrhea   Nsaids Other (See Comments)    upset stomach    Social History:  reports that she has never smoked. She has never used smokeless tobacco. She reports that she does not drink alcohol and does not use drugs.  Family History: Family History  Problem Relation Age of Onset   Cancer Maternal Aunt        breast   Heart attack Father 63       Smoker   Diabetes Mother    Hyperlipidemia Mother    Anesthesia problems Neg Hx    Hypotension Neg Hx     Physical Exam: Vitals:   06/28/23 1928  BP: 138/81  Pulse: 84  Resp: 18  Temp: 98.4 F (36.9 C)  TempSrc: Oral  SpO2: 96%    General:  A&Ox3, well developed and nourished, no acute distress Eyes: Pink conjunctiva, no scleral icterus ENT: Moist oral mucosa, neck supple, no thyromegaly Lungs: CTA B/L, no wheeze, no crackles, no use of accessory muscles Cardiovascular: RRR, no regurgitation, no gallops, no murmurs. No carotid bruits, no JVD Abdomen: soft, positive BS, mild right-sided TTP, no flank pain, not an acute abdomen GU: not examined Neuro: CN II - XII grossly intact, sensation intact Musculoskeletal: Moves all extremities equally, no edema Skin: no rash, no subcutaneous crepitation, no decubitus Psych: appropriate patient   Labs on Admission:  Recent Labs    06/27/23 1812 06/28/23 2018  NA 136 136  K 4.2 4.5  CL 106 105  CO2 19* 20*  GLUCOSE 195* 289*  BUN 19 24*  CREATININE 1.16* 1.81*  CALCIUM  9.2 9.0   Recent Labs    06/27/23 1812 06/28/23 2018  AST 29 24  ALT 24 20  ALKPHOS 75 77  BILITOT 0.6 0.7  PROT 6.6 7.0  ALBUMIN 3.4* 3.6   Recent Labs    06/27/23 1812  06/28/23 2018  LIPASE 36 34   Recent Labs    06/27/23 1812 06/28/23 2018  WBC 12.4* 15.2*  NEUTROABS 8.7* 10.9*  HGB 10.4* 10.7*  HCT 33.5* 33.8*  MCV 95.2 94.7  PLT 371 410*    Radiological Exams on Admission: US  RENAL Result Date: 06/28/2023 CLINICAL DATA:  Right flank pain. EXAM: RENAL / URINARY TRACT ULTRASOUND COMPLETE COMPARISON:  None Available. FINDINGS: Right Kidney: Renal measurements: 10.3 cm x 4.8 cm x 5.8 cm = volume: 148.7 mL. Echogenicity within normal limits. No mass is visualized. There is mild right-sided hydronephrosis. Left Kidney: Renal measurements: 9.2 cm x 4.7 cm x 5.5 cm = volume: 124.5 mL. Echogenicity within normal limits. No mass or hydronephrosis visualized. Bladder: The urinary bladder is empty and subsequently limited in evaluation. Other: None. IMPRESSION: Mild right-sided hydronephrosis. Electronically Signed   By: Suzen Dials M.D.   On: 06/28/2023 22:21   CT ABDOMEN PELVIS W CONTRAST Result Date: 06/27/2023 CLINICAL DATA:  Right lower quadrant abdominal pain. EXAM: CT ABDOMEN AND PELVIS WITH CONTRAST TECHNIQUE: Multidetector CT imaging of the abdomen and pelvis was performed using the standard protocol following bolus administration of intravenous contrast. RADIATION DOSE REDUCTION: This exam was performed according to the departmental dose-optimization program which includes automated exposure control, adjustment of the mA and/or kV according to patient size and/or use of iterative reconstruction technique. CONTRAST:  75mL OMNIPAQUE  IOHEXOL  300 MG/ML  SOLN COMPARISON:  CT abdomen pelvis dated 05/30/2023. FINDINGS: Lower chest: The visualized lung bases are clear. No intra-abdominal free air or free fluid. Hepatobiliary: The liver is unremarkable. No biliary dilatation. Cholecystectomy. Pancreas: Unremarkable. No pancreatic ductal dilatation or surrounding inflammatory changes. Spleen: Normal in size without focal abnormality. Adrenals/Urinary Tract: The  adrenal glands are unremarkable. The 4 mm stone seen in the inferior pole of the right kidney on prior CT is not located in the proximal right ureter. There is mild right hydronephrosis. A 1 cm stone in the upper pole of the left kidney on the prior CT is not located in the left renal pelvis. There is mild left pelviectasis without hydronephrosis. The urinary bladder is collapsed. Stomach/Bowel: There is no bowel obstruction or active inflammation. The appendix is normal. Vascular/Lymphatic: The abdominal aorta and IVC are unremarkable. No portal venous gas. There is no adenopathy. Reproductive: The  uterus is grossly unremarkable. No suspicious adnexal masses. Other: None Musculoskeletal: Degenerative changes of spine. No acute osseous pathology. IMPRESSION: 1. A 4 mm proximal right ureteral calculus with mild right hydronephrosis. 2. A 1 cm stone in the left renal pelvis. No hydronephrosis on the left. 3. No bowel obstruction. Normal appendix. Electronically Signed   By: Vanetta Chou M.D.   On: 06/27/2023 20:17    Assessment/Plan Present on Admission:  Right sided nephrolithiasis with hydronephrosis //  lactic acidosis -Urine cultures collected.  Urinalysis not consistent with infection. -Continue Flomax  daily -Dilaudid  as needed -Zofran  as needed -Urology consult placed, Dr. Fleeta to see patient in AM. -N.p.o. at midnight   Acute on chronic kidney injury -Cozaar  on hold   T2DM, uncontrolled //  Neuropathy, diabetic (HCC) -Sliding scale insulin  ordered -Lantus  8 units [decreased from 14].  Sleep ordered   Hypertension -Cozaar  on hold -As needed hydralazine    HLD -Atorvastatin  resumed   Depression, major, in remission //  anxiety -Continue Effexor , Wellbutrin    Layli Capshaw 06/28/2023, 10:44 PM

## 2023-06-28 NOTE — H&P (View-Only) (Signed)
 Urology Consult Note   Requesting Attending Physician:  Tegeler, Lonni PARAS, * Service Providing Consult: Urology  Consulting Attending: Sherwood Edison  Reason for Consult:  Nephrolithiasis  HPI: Mindy Richard is seen in consultation for reasons noted above at the request of Tegeler, Lonni PARAS, * for evalaution of bilateral stone burden.   This is a 67 y.o. female with past medical history significant for anxiety, depression, nephrolithiasis, who presented to the ED over the weekend and was found to have bilateral stone burden.  She was sent home by the emergency department on medical expulsion therapy.  She returns today given poor pain control.  Her CT scan from yesterday shows a 5 mm right proximal stone, and a left 1 cm left UPJ stone.  Her white blood cell count is at 15.  Her urinalysis is not overtly concerning for infection, with 11-20 white blood cells, greater than 50 red blood cells, and no bacterial seen, LE and nitrite negative.   Her kidney function is decreased, with creatinine at 1.8, up from 1.2 over the weekend.  Urology consulted given failed MET.    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   Wears glasses     Past Surgical History:  Past Surgical History:  Procedure Laterality Date   CARPAL TUNNEL RELEASE Bilateral    left 2017;  right 2001   CESAREAN SECTION  1985   CHOLECYSTECTOMY  09/02/2011   Procedure: LAPAROSCOPIC  CHOLECYSTECTOMY WITH INTRAOPERATIVE CHOLANGIOGRAM;  Surgeon: Sherlean PARAS Laughter, MD;  Location: [facility] OR;  Service: General;  Laterality: N/A;   COLONOSCOPY     CYSTOSCOPY/URETEROSCOPY/HOLMIUM LASER/STENT PLACEMENT Left 05/08/2021   Procedure: LEFT /URETEROSCOPY/HOLMIUM LASER/STENT PLACEMENT;  Surgeon: Lovie Arlyss CROME, MD;  Location: [facility];  Service: Urology;  Laterality: Left;   DILATION AND CURETTAGE OF UTERUS  1984   ERCP  09/03/2011   Procedure: ENDOSCOPIC RETROGRADE CHOLANGIOPANCREATOGRAPHY (ERCP);  Surgeon: Norleen JAYSON Hint, MD;  Location: [facility] ENDOSCOPY;  Service: Endoscopy;  Laterality: N/A;  sphincterotomy and stone extraction   SHOULDER ARTHROSCOPY WITH SUBACROMIAL DECOMPRESSION Left 10/02/2010   @[facility]  by dr amy;   w/ closed manipulation   TONSILLECTOMY  1970   TUBAL LIGATION Bilateral 1985    Medication: Current Facility-Administered Medications  Medication Dose Route Frequency Provider Last Rate Last Admin   HYDROcodone -acetaminophen  (NORCO/VICODIN) 5-325 MG per tablet 1 tablet  1 tablet Oral Once Hildegard Loge, PA-C       Current Outpatient Medications  Medication Sig Dispense Refill   atorvastatin  (LIPITOR) 40 MG tablet Take 1 tablet (40 mg total) by mouth daily. (Patient taking differently: Take 40 mg by mouth at bedtime.) 90 tablet 2   buPROPion  (WELLBUTRIN  XL) 300 MG 24 hr tablet Take 300 mg by mouth daily.      Cholecalciferol  (VITAMIN D ) 2000 units tablet Take 2,000 Units by mouth daily  with breakfast.     clonazePAM  (KLONOPIN ) 1 MG tablet Take 1 mg by mouth as needed for anxiety.     diclofenac  sodium (VOLTAREN ) 1 % GEL Apply 2 g topically 4 (four) times daily. (Patient taking differently: Apply 2 g topically 4 (four) times daily as needed (for pain).) 100 g 2   ferrous sulfate 325 (65 FE) MG tablet Take 325 mg by mouth daily with breakfast. M/ W/ F     LANTUS  SOLOSTAR 100 UNIT/ML Solostar Pen INJECT 8 UNITS SUBCUTANEOUSLY EVERY DAY AS DIRECTED OR AS  DIRECTED BY PRIMARY CARE PHYSICIAN DISCARD PEN 28 DAYS AFTER OPENING (Patient taking differently: Inject 14 Units into the skin at bedtime.) 15 mL 2   losartan  (COZAAR ) 100 MG tablet TAKE 1/2 TO 1 TABLET DAILY AS DIRECTED FOR BLOOD PRESSURE AND KIDNEY PROTECTION (Patient taking differently: Take 50 mg by mouth daily.) 90 tablet 4   metFORMIN  (GLUCOPHAGE -XR) 500 MG 24 hr tablet Take 1,000 mg by mouth 2 (two) times daily.     mirtazapine (REMERON) 15 MG tablet Take 7.5 mg by mouth at bedtime.     ondansetron  (ZOFRAN -ODT) 4 MG disintegrating tablet Take 1 tablet (4 mg total) by mouth every 8 (eight) hours as needed for nausea. 10 tablet 0   oxyCODONE -acetaminophen  (PERCOCET) 5-325 MG tablet Take 1 tablet by mouth every 4 (four) hours as needed. 20 tablet 0   pantoprazole  (PROTONIX ) 40 MG tablet Take 40 mg by mouth daily.     tamsulosin  (FLOMAX ) 0.4 MG CAPS capsule Take 1 capsule (0.4 mg total) by mouth daily after supper. 10 capsule 0   venlafaxine  XR (EFFEXOR -XR) 150 MG 24 hr capsule Take 150 mg by mouth daily with breakfast.      zolpidem  (AMBIEN ) 10 MG tablet Take 10 mg by mouth at bedtime.      amoxicillin (AMOXIL) 500 MG capsule Take 500 mg by mouth 3 (three) times daily. (Patient not taking: Reported on 06/28/2023)     blood glucose meter kit and supplies Check sugar 3 times a day. DxE11.9Dispense based insurance preference. 1 each 0   cephALEXin  (KEFLEX ) 500 MG capsule Take 1 capsule (500 mg total) by mouth 2 (two) times daily. (Patient not taking: Reported on 06/28/2023) 10 capsule 0   DROPLET PEN NEEDLES 31G X 8 MM MISC USE AS DIRECTED  WITH  LANTUS   INJECTIONS 100 each 3   gabapentin  (NEURONTIN ) 100 MG capsule Take 1 capsule (100 mg total) by mouth 3 (three) times daily. (Patient not taking: Reported on 05/06/2021) 90 capsule 2   glucose blood test strip One touch Test blood sugar TID PRN DX insulin  dependent diabetic E11.9 100 each 12   hydrocortisone  (PROCTO-PAK) 1 % CREA Apply once or twice  daily as needed for hemorrhoids (Patient not taking: Reported on 05/06/2021) 28.35 g 3   ondansetron  (ZOFRAN ) 4 MG tablet Take 1 tablet (4 mg total) by mouth every 8 (eight) hours as needed for nausea or vomiting. (Patient not taking: Reported on 06/28/2023) 12 tablet 0   oxybutynin  (DITROPAN ) 5 MG tablet Take 1 tablet (5 mg total) by mouth 3 (three) times daily as needed for bladder spasms. (Patient not taking: Reported on 06/28/2023) 21 tablet 1   oxyCODONE -acetaminophen  (PERCOCET/ROXICET) 5-325 MG tablet Take 1 tablet by mouth every 4 (four) hours as needed for severe pain (pain score 7-10). 15 tablet 0   Saxagliptin -Metformin  (KOMBIGLYZE XR) 2.09-998 MG TB24 TAKE 1 TABLET TWICE DAILY WITH A MEAL (Patient not taking: Reported on 06/28/2023)  30 tablet 0   tamsulosin  (FLOMAX ) 0.4 MG CAPS capsule Take 1 capsule (0.4 mg total) by mouth daily as needed (flank pain). 14 capsule 0   triamcinolone  ointment (KENALOG ) 0.1 % Apply 1 application topically 2 (two) times daily. (Patient not taking: Reported on 05/06/2021) 80 g 1    Allergies: Allergies  Allergen Reactions   Codeine Other (See Comments)    Makes her head feel weird   Glucophage  [Metformin  Hcl] Diarrhea   Nsaids Other (See Comments)    upset stomach    Social History: Social History   Tobacco Use   Smoking status: Never   Smokeless tobacco: Never  Vaping Use   Vaping status: Never Used  Substance Use Topics   Alcohol use: No   Drug use: Never    Family History Family History  Problem Relation Age of Onset   Cancer Maternal Aunt        breast   Heart attack Father 9       Smoker   Diabetes Mother    Hyperlipidemia Mother    Anesthesia problems Neg Hx    Hypotension Neg Hx     Review of Systems 10 systems were reviewed and are negative except as noted specifically in the HPI.  Objective   Vital signs in last 24 hours: BP 138/81 (BP Location: Left Arm)   Pulse 84   Temp 98.4 F (36.9 C) (Oral)   Resp 18   SpO2  96%   Physical Exam General: NAD, A&O, resting, appropriate HEENT: [place]/AT, EOMI, MMM Pulmonary: Normal work of breathing Cardiovascular: HDS, adequate peripheral perfusion Abdomen: Soft, NTTP, nondistended, Nontender. GU: VS, Mild CVA tenderness Extremities: warm and well perfused Neuro: Appropriate, no focal neurological deficits  Most Recent Labs: Lab Results  Component Value Date   WBC 15.2 (H) 06/28/2023   HGB 10.7 (L) 06/28/2023   HCT 33.8 (L) 06/28/2023   PLT 410 (H) 06/28/2023    Lab Results  Component Value Date   NA 136 06/28/2023   K 4.5 06/28/2023   CL 105 06/28/2023   CO2 20 (L) 06/28/2023   BUN 24 (H) 06/28/2023   CREATININE 1.81 (H) 06/28/2023   CALCIUM  9.0 06/28/2023   MG 1.8 12/29/2017    Lab Results  Component Value Date   INR 0.91 05/09/2011   APTT 24 05/09/2011     Urine Culture: @[id number] (laburin,org,[id number])@   IMAGING: US  RENAL Result Date: 06/28/2023 CLINICAL DATA:  Right flank pain. EXAM: RENAL / URINARY TRACT ULTRASOUND COMPLETE COMPARISON:  None Available. FINDINGS: Right Kidney: Renal measurements: 10.3 cm x 4.8 cm x 5.8 cm = volume: 148.7 mL. Echogenicity within normal limits. No mass is visualized. There is mild right-sided hydronephrosis. Left Kidney: Renal measurements: 9.2 cm x 4.7 cm x 5.5 cm = volume: 124.5 mL. Echogenicity within normal limits. No mass or hydronephrosis visualized. Bladder: The urinary bladder is empty and subsequently limited in evaluation. Other: None. IMPRESSION: Mild right-sided hydronephrosis. Electronically Signed   By: Suzen Dials M.D.   On: 06/28/2023 22:21   CT ABDOMEN PELVIS W CONTRAST Result Date: 06/27/2023 CLINICAL DATA:  Right lower quadrant abdominal pain. EXAM: CT ABDOMEN AND PELVIS WITH CONTRAST TECHNIQUE: Multidetector CT imaging of the abdomen and pelvis was performed using the standard protocol following bolus administration of intravenous contrast. RADIATION DOSE REDUCTION: This exam was  performed according to the departmental dose-optimization program which includes automated exposure control, adjustment of the mA and/or kV according to patient size and/or use of iterative reconstruction  technique. CONTRAST:  75mL OMNIPAQUE  IOHEXOL  300 MG/ML  SOLN COMPARISON:  CT abdomen pelvis dated 05/30/2023. FINDINGS: Lower chest: The visualized lung bases are clear. No intra-abdominal free air or free fluid. Hepatobiliary: The liver is unremarkable. No biliary dilatation. Cholecystectomy. Pancreas: Unremarkable. No pancreatic ductal dilatation or surrounding inflammatory changes. Spleen: Normal in size without focal abnormality. Adrenals/Urinary Tract: The adrenal glands are unremarkable. The 4 mm stone seen in the inferior pole of the right kidney on prior CT is not located in the proximal right ureter. There is mild right hydronephrosis. A 1 cm stone in the upper pole of the left kidney on the prior CT is not located in the left renal pelvis. There is mild left pelviectasis without hydronephrosis. The urinary bladder is collapsed. Stomach/Bowel: There is no bowel obstruction or active inflammation. The appendix is normal. Vascular/Lymphatic: The abdominal aorta and IVC are unremarkable. No portal venous gas. There is no adenopathy. Reproductive: The uterus is grossly unremarkable. No suspicious adnexal masses. Other: None Musculoskeletal: Degenerative changes of spine. No acute osseous pathology. IMPRESSION: 1. A 4 mm proximal right ureteral calculus with mild right hydronephrosis. 2. A 1 cm stone in the left renal pelvis. No hydronephrosis on the left. 3. No bowel obstruction. Normal appendix. Electronically Signed   By: Vanetta Chou M.D.   On: 06/27/2023 20:17    ------  Assessment:  68 y.o. female with hyperlipidemia, hypertension, and bilateral nephrolithiasis who presents to the emergency department after failed medical expulsion therapy of the right proximal stone, and a subsequent left 1 cm  renal pelvis stone  White count is mildly elevated compared to her emergency department visit over this weekend, white blood cell count currently at 15.  She also has a bit more of an AKI, with creatinine of 1.8.  Reassuringly, she is hemodynamically stable without signs of infection.  Given poor pain control and concern for bilateral ureteral obstruction, will plan for interval bilateral stent placement and laser lithotripsy.   Recommendations: - Recommend admission to medicine team - please ensure pt NPO @ [facility]  - Case posted, will add-on for 06/29/2023 - Please send dedicated urine culture   Lynwood LELON Decamp, MD Resident Physician [facility]    Thank you for this consult. Please contact the urology consult pager with any further questions/concerns.

## 2023-06-28 NOTE — ED Triage Notes (Signed)
 Pt DX with kidney stone here last night 4mm, pt reports taken flomax  and oxy at home with no relief. Pain 10/10

## 2023-06-28 NOTE — ED Provider Notes (Signed)
 [place] [facility]ERGENCY DEPARTMENT AT [facility] Provider Note   CSN: [id number] Arrival date & time: 06/28/23  1913     History  Chief Complaint  Patient presents with   Flank Pain    Mindy Richard is a 68 y.o. female.  The history is provided by the patient, medical records and the spouse. No language interpreter was used.  Flank Pain This is a new problem. The current episode started yesterday. The problem occurs constantly. The problem has been gradually worsening. Associated symptoms include abdominal pain. Pertinent negatives include no chest pain, no headaches and no shortness of breath. Nothing aggravates the symptoms. Nothing relieves the symptoms. She has tried nothing for the symptoms. The treatment provided no relief.       Home Medications Prior to Admission medications   Medication Sig Start Date End Date Taking? Authorizing Provider  atorvastatin  (LIPITOR) 40 MG tablet Take 1 tablet (40 mg total) by mouth daily. Patient taking differently: Take 40 mg by mouth at bedtime. 09/08/17 05/06/21  Craig Palma R, PA-C  blood glucose meter kit and supplies Check sugar 3 times a day. DxE11.9Dispense based insurance preference. 09/11/17   Craig Palma SAUNDERS, PA-C  buPROPion  (WELLBUTRIN  XL) 300 MG 24 hr tablet Take 300 mg by mouth daily.  05/21/14   [provider]  cephALEXin  (KEFLEX ) 500 MG capsule Take 1 capsule (500 mg total) by mouth 2 (two) times daily. 05/08/21   Lovie Arlyss CROME, MD  Cholecalciferol  (VITAMIN D ) 2000 units tablet Take 2,000 Units by mouth daily with breakfast.    [provider]  diclofenac  sodium (VOLTAREN ) 1 % GEL Apply 2 g topically 4 (four) times daily. Patient taking differently: Apply 2 g topically 4 (four) times daily as needed (for pain). 02/04/16   Tonita Fallow, MD  DROPLET PEN NEEDLES 31G X 8 MM MISC USE AS DIRECTED  WITH  LANTUS   INJECTIONS 03/08/18   Tonita Fallow, MD  ferrous sulfate 325 (65 FE) MG tablet Take  325 mg by mouth 3 (three) times a week. M/ W/ F    [provider]  gabapentin  (NEURONTIN ) 100 MG capsule Take 1 capsule (100 mg total) by mouth 3 (three) times daily. Patient not taking: Reported on 05/06/2021 09/08/17 09/08/18  Craig Palma R, PA-C  glucose blood test strip One touch Test blood sugar TID PRN DX insulin  dependent diabetic E11.9 09/09/17   Craig Palma SAUNDERS, PA-C  hydrocortisone  (PROCTO-PAK) 1 % CREA Apply once or twice daily as needed for hemorrhoids Patient not taking: Reported on 05/06/2021 08/07/16   Craig Palma SAUNDERS, PA-C  LANTUS  SOLOSTAR 100 UNIT/ML Solostar Pen INJECT 8 UNITS SUBCUTANEOUSLY EVERY DAY AS DIRECTED OR AS DIRECTED BY PRIMARY CARE PHYSICIAN DISCARD PEN 28 DAYS AFTER OPENING Patient taking differently: 10 Units at bedtime. 01/12/18   Tonita Fallow, MD  losartan  (COZAAR ) 100 MG tablet TAKE 1/2 TO 1 TABLET DAILY AS DIRECTED FOR BLOOD PRESSURE AND KIDNEY PROTECTION Patient taking differently: Take 50 mg by mouth daily. 12/03/16   Tonita Fallow, MD  mirtazapine (REMERON) 15 MG tablet Take 7.5 mg by mouth at bedtime.    [provider]  ondansetron  (ZOFRAN ) 4 MG tablet Take 1 tablet (4 mg total) by mouth every 8 (eight) hours as needed for nausea or vomiting. 06/27/23   Aylen Stradford, Lonni PARAS, MD  ondansetron  (ZOFRAN -ODT) 4 MG disintegrating tablet Take 1 tablet (4 mg total) by mouth every 8 (eight) hours as needed for nausea. 05/30/23   Jarold Olam HERO,  PA-C  oxybutynin  (DITROPAN ) 5 MG tablet Take 1 tablet (5 mg total) by mouth 3 (three) times daily as needed for bladder spasms. 05/08/21   Lovie Arlyss CROME, MD  oxyCODONE -acetaminophen  (PERCOCET) 5-325 MG tablet Take 1 tablet by mouth every 4 (four) hours as needed. 05/30/23   Jarold Olam HERO, PA-C  oxyCODONE -acetaminophen  (PERCOCET/ROXICET) 5-325 MG tablet Take 1 tablet by mouth every 4 (four) hours as needed for severe pain (pain score 7-10). 06/27/23   Chrystian Ressler, Lonni PARAS, MD  pantoprazole   (PROTONIX ) 40 MG tablet Take 40 mg by mouth daily.    [provider]  Saxagliptin -Metformin  (KOMBIGLYZE XR) 2.09-998 MG TB24 TAKE 1 TABLET TWICE DAILY WITH A MEAL Patient taking differently: Take 1 tablet by mouth 2 (two) times daily. TAKE 1 TABLET TWICE DAILY WITH A MEAL 11/04/17   Tonita Fallow, MD  tamsulosin  (FLOMAX ) 0.4 MG CAPS capsule Take 1 capsule (0.4 mg total) by mouth daily after supper. 05/30/23   Jarold Olam HERO, PA-C  tamsulosin  (FLOMAX ) 0.4 MG CAPS capsule Take 1 capsule (0.4 mg total) by mouth daily as needed (flank pain). 06/27/23   Adrien Shankar, Lonni PARAS, MD  triamcinolone  ointment (KENALOG ) 0.1 % Apply 1 application topically 2 (two) times daily. Patient not taking: Reported on 05/06/2021 08/07/16   Craig Alan SAUNDERS, PA-C  venlafaxine  XR (EFFEXOR -XR) 150 MG 24 hr capsule Take 150 mg by mouth daily with breakfast.  05/21/14   [provider]  zolpidem  (AMBIEN ) 10 MG tablet Take 10 mg by mouth at bedtime.  05/06/11   [provider]      Allergies    Codeine, Glucophage  [metformin  hcl], and Nsaids    Review of Systems   Review of Systems  Constitutional:  Positive for chills and fatigue.  HENT:  Negative for congestion.   Respiratory:  Negative for cough, chest tightness, shortness of breath and wheezing.   Cardiovascular:  Negative for chest pain and palpitations.  Gastrointestinal:  Positive for abdominal pain and nausea. Negative for constipation, diarrhea and vomiting.  Genitourinary:  Positive for flank pain. Negative for dysuria.  Musculoskeletal:  Positive for back pain. Negative for neck pain and neck stiffness.  Skin:  Negative for rash.  Neurological:  Negative for light-headedness and headaches.  Psychiatric/Behavioral:  Negative for behavioral problems.   All other systems reviewed and are negative.   Physical Exam Updated Vital Signs BP 138/81 (BP Location: Left Arm)   Pulse 84   Temp 98.4 F (36.9 C) (Oral)   Resp 18   SpO2  96%  Physical Exam Vitals and nursing note reviewed.  Constitutional:      General: She is not in acute distress.    Appearance: She is well-developed. She is not ill-appearing or diaphoretic.  HENT:     Head: Normocephalic and atraumatic.     Nose: No congestion or rhinorrhea.     Mouth/Throat:     Mouth: Mucous membranes are moist.     Pharynx: No oropharyngeal exudate or posterior oropharyngeal erythema.  Eyes:     Extraocular Movements: Extraocular movements intact.     Conjunctiva/sclera: Conjunctivae normal.     Pupils: Pupils are equal, round, and reactive to light.  Cardiovascular:     Rate and Rhythm: Normal rate and regular rhythm.     Heart sounds: No murmur heard. Pulmonary:     Effort: Pulmonary effort is normal. No respiratory distress.     Breath sounds: Normal breath sounds. No wheezing, rhonchi or rales.  Chest:  Chest wall: No tenderness.  Abdominal:     General: Abdomen is flat.     Palpations: Abdomen is soft.     Tenderness: There is abdominal tenderness. There is right CVA tenderness. There is no left CVA tenderness, guarding or rebound.  Musculoskeletal:        General: Tenderness present. No swelling.     Cervical back: Neck supple.     Right lower leg: No edema.     Left lower leg: No edema.  Skin:    General: Skin is warm and dry.     Capillary Refill: Capillary refill takes less than 2 seconds.     Findings: No erythema.  Neurological:     General: No focal deficit present.     Mental Status: She is alert.  Psychiatric:        Mood and Affect: Mood normal.     ED Results / Procedures / Treatments   Labs (all labs ordered are listed, but only abnormal results are displayed) Labs Reviewed  CBC WITH DIFFERENTIAL/PLATELET - Abnormal; Notable for the following components:      Result Value   WBC 15.2 (*)    RBC 3.57 (*)    Hemoglobin 10.7 (*)    HCT 33.8 (*)    Platelets 410 (*)    Neutro Abs 10.9 (*)    Monocytes Absolute 1.1 (*)     All other components within normal limits  COMPREHENSIVE METABOLIC PANEL - Abnormal; Notable for the following components:   CO2 20 (*)    Glucose, Bld 289 (*)    BUN 24 (*)    Creatinine, Ser 1.81 (*)    GFR, Estimated 30 (*)    All other components within normal limits  URINALYSIS, ROUTINE W REFLEX MICROSCOPIC - Abnormal; Notable for the following components:   APPearance HAZY (*)    Specific Gravity, Urine 1.042 (*)    Glucose, UA 50 (*)    Hgb urine dipstick LARGE (*)    Protein, ur 30 (*)    All other components within normal limits  LIPASE, BLOOD  HEMOGLOBIN A1C  BASIC METABOLIC PANEL  CBC WITH DIFFERENTIAL/PLATELET    EKG None  Radiology US  RENAL Result Date: 06/28/2023 CLINICAL DATA:  Right flank pain. EXAM: RENAL / URINARY TRACT ULTRASOUND COMPLETE COMPARISON:  None Available. FINDINGS: Right Kidney: Renal measurements: 10.3 cm x 4.8 cm x 5.8 cm = volume: 148.7 mL. Echogenicity within normal limits. No mass is visualized. There is mild right-sided hydronephrosis. Left Kidney: Renal measurements: 9.2 cm x 4.7 cm x 5.5 cm = volume: 124.5 mL. Echogenicity within normal limits. No mass or hydronephrosis visualized. Bladder: The urinary bladder is empty and subsequently limited in evaluation. Other: None. IMPRESSION: Mild right-sided hydronephrosis. Electronically Signed   By: Suzen Dials M.D.   On: 06/28/2023 22:21   CT ABDOMEN PELVIS W CONTRAST Result Date: 06/27/2023 CLINICAL DATA:  Right lower quadrant abdominal pain. EXAM: CT ABDOMEN AND PELVIS WITH CONTRAST TECHNIQUE: Multidetector CT imaging of the abdomen and pelvis was performed using the standard protocol following bolus administration of intravenous contrast. RADIATION DOSE REDUCTION: This exam was performed according to the departmental dose-optimization program which includes automated exposure control, adjustment of the mA and/or kV according to patient size and/or use of iterative reconstruction technique.  CONTRAST:  75mL OMNIPAQUE  IOHEXOL  300 MG/ML  SOLN COMPARISON:  CT abdomen pelvis dated 05/30/2023. FINDINGS: Lower chest: The visualized lung bases are clear. No intra-abdominal free air or free fluid. Hepatobiliary: The  liver is unremarkable. No biliary dilatation. Cholecystectomy. Pancreas: Unremarkable. No pancreatic ductal dilatation or surrounding inflammatory changes. Spleen: Normal in size without focal abnormality. Adrenals/Urinary Tract: The adrenal glands are unremarkable. The 4 mm stone seen in the inferior pole of the right kidney on prior CT is not located in the proximal right ureter. There is mild right hydronephrosis. A 1 cm stone in the upper pole of the left kidney on the prior CT is not located in the left renal pelvis. There is mild left pelviectasis without hydronephrosis. The urinary bladder is collapsed. Stomach/Bowel: There is no bowel obstruction or active inflammation. The appendix is normal. Vascular/Lymphatic: The abdominal aorta and IVC are unremarkable. No portal venous gas. There is no adenopathy. Reproductive: The uterus is grossly unremarkable. No suspicious adnexal masses. Other: None Musculoskeletal: Degenerative changes of spine. No acute osseous pathology. IMPRESSION: 1. A 4 mm proximal right ureteral calculus with mild right hydronephrosis. 2. A 1 cm stone in the left renal pelvis. No hydronephrosis on the left. 3. No bowel obstruction. Normal appendix. Electronically Signed   By: Vanetta Chou M.D.   On: 06/27/2023 20:17    Procedures Procedures    Medications Ordered in ED Medications  HYDROcodone -acetaminophen  (NORCO/VICODIN) 5-325 MG per tablet 1 tablet (1 tablet Oral Patient Refused/Not Given 06/28/23 2019)  insulin  aspart (novoLOG ) injection 0-15 Units (has no administration in time range)  insulin  aspart (novoLOG ) injection 0-5 Units (has no administration in time range)  enoxaparin  (LOVENOX ) injection 40 mg (has no administration in time range)   acetaminophen  (TYLENOL ) tablet 650 mg (has no administration in time range)    Or  acetaminophen  (TYLENOL ) suppository 650 mg (has no administration in time range)  senna-docusate (Senokot-S) tablet 1 tablet (has no administration in time range)  ondansetron  (ZOFRAN ) tablet 4 mg (has no administration in time range)    Or  ondansetron  (ZOFRAN ) injection 4 mg (has no administration in time range)  HYDROmorphone  (DILAUDID ) injection 0.5 mg (has no administration in time range)  hydrALAZINE  (APRESOLINE ) injection 5 mg (has no administration in time range)  tamsulosin  (FLOMAX ) capsule 0.4 mg (has no administration in time range)  insulin  glargine-yfgn (SEMGLEE ) injection 14 Units (has no administration in time range)  atorvastatin  (LIPITOR) tablet 40 mg (has no administration in time range)  buPROPion  (WELLBUTRIN  XL) 24 hr tablet 300 mg (has no administration in time range)  clonazePAM  (KLONOPIN ) tablet 1 mg (has no administration in time range)  pantoprazole  (PROTONIX ) EC tablet 40 mg (has no administration in time range)  venlafaxine  XR (EFFEXOR -XR) 24 hr capsule 150 mg (has no administration in time range)  zolpidem  (AMBIEN ) tablet 5 mg (has no administration in time range)  cefTRIAXone  (ROCEPHIN ) 1 g in sodium chloride  0.9 % 100 mL IVPB (has no administration in time range)  ondansetron  (ZOFRAN -ODT) disintegrating tablet 8 mg (8 mg Oral Given 06/28/23 2011)  HYDROmorphone  (DILAUDID ) injection 1 mg (1 mg Intravenous Given 06/28/23 2141)    ED Course/ Medical Decision Making/ A&P                                 Medical Decision Making Amount and/or Complexity of Data Reviewed Radiology: ordered.  Risk Prescription drug management. Decision regarding hospitalization.    Mindy Richard is a 68 y.o. female with past medical history significant for GERD, sleep apnea, hypertension, diabetes, kidney stones, and recent discovery of kidney stone yesterday who presents with persistent flank  pain.  I saw this patient yesterday in this emergency department and we found a 4 mm stone in her right proximal ureter.  We decided to attempt medical expulsion therapy as an outpatient with pain medicine, nausea medicine, and Flomax .  Patient was feeling better and went home.  She reports today the symptoms continue to worsen and now she is feeling worse.  She describes the pain as 10 out of 10 and she is feeling worse.  She reports feeling fatigued and some chills.  Still denies dysuria.  Denies any vomiting but said some nausea.  Reports the pain is worse in her right flank and back.  Otherwise denies trauma, constipation, or diarrhea.  On exam, still tender in her right abdomen and flank.  Lungs clear.  Abdomen had normal bowel sounds.  As patient is failed outpatient management, she will likely admission.  I called and spoke to urology who will come see her and recommended admission to medicine service for likely stenting tomorrow.  She will remain NPO.  Labs show worsening kidney function and worsening leukocytosis.  Urinalysis still did not show UTI.  Will admit to medicine for further management of persistent kidney stone that did not improve.  Ultrasound here showed persistent hydronephrosis.  Patient be admitted for further management.            Final Clinical Impression(s) / ED Diagnoses Final diagnoses:  Kidney stone   Clinical Impression: 1. Kidney stone     Disposition: Admit  This note was prepared with assistance of Dragon voice recognition software. Occasional wrong-word or sound-a-like substitutions may have occurred due to the inherent limitations of voice recognition software.      Imran Nuon, Lonni PARAS, MD 06/28/23 [id number]

## 2023-06-29 ENCOUNTER — Inpatient Hospital Stay (HOSPITAL_COMMUNITY): Payer: Medicare Other | Admitting: Anesthesiology

## 2023-06-29 ENCOUNTER — Encounter (HOSPITAL_COMMUNITY): Payer: Self-pay | Admitting: Family Medicine

## 2023-06-29 ENCOUNTER — Other Ambulatory Visit: Payer: Self-pay

## 2023-06-29 ENCOUNTER — Encounter (HOSPITAL_COMMUNITY)
Admission: EM | Disposition: A | Discharge status: Home / Self Care | Payer: Self-pay | Source: Home / Self Care | Attending: Emergency Medicine

## 2023-06-29 ENCOUNTER — Inpatient Hospital Stay (HOSPITAL_COMMUNITY): Payer: Medicare Other

## 2023-06-29 DIAGNOSIS — N201 Calculus of ureter: Secondary | ICD-10-CM | POA: Diagnosis not present

## 2023-06-29 DIAGNOSIS — E785 Hyperlipidemia, unspecified: Secondary | ICD-10-CM | POA: Diagnosis not present

## 2023-06-29 DIAGNOSIS — I10 Essential (primary) hypertension: Secondary | ICD-10-CM

## 2023-06-29 DIAGNOSIS — N200 Calculus of kidney: Secondary | ICD-10-CM | POA: Diagnosis not present

## 2023-06-29 DIAGNOSIS — N202 Calculus of kidney with calculus of ureter: Secondary | ICD-10-CM

## 2023-06-29 DIAGNOSIS — G4733 Obstructive sleep apnea (adult) (pediatric): Secondary | ICD-10-CM | POA: Diagnosis not present

## 2023-06-29 DIAGNOSIS — E119 Type 2 diabetes mellitus without complications: Secondary | ICD-10-CM | POA: Diagnosis not present

## 2023-06-29 HISTORY — PX: CYSTOSCOPY/URETEROSCOPY/HOLMIUM LASER/STENT PLACEMENT: SHX6546

## 2023-06-29 LAB — GLUCOSE, CAPILLARY
Glucose-Capillary: 152 mg/dL — ABNORMAL HIGH (ref 70–99)
Glucose-Capillary: 135 mg/dL — ABNORMAL HIGH (ref 70–99)
Glucose-Capillary: 241 mg/dL — ABNORMAL HIGH (ref 70–99)
Glucose-Capillary: 200 mg/dL — ABNORMAL HIGH (ref 70–99)

## 2023-06-29 LAB — BASIC METABOLIC PANEL
Anion gap: 8 (ref 5–15)
BUN: 22 mg/dL (ref 8–23)
CO2: 22 mmol/L (ref 22–32)
Calcium: 8.8 mg/dL — ABNORMAL LOW (ref 8.9–10.3)
Chloride: 107 mmol/L (ref 98–111)
Creatinine, Ser: 1.81 mg/dL — ABNORMAL HIGH (ref 0.44–1.00)
GFR, Estimated: 30 mL/min — ABNORMAL LOW (ref >60)
Glucose, Bld: 130 mg/dL — ABNORMAL HIGH (ref 70–99)
Potassium: 4.1 mmol/L (ref 3.5–5.1)
Sodium: 137 mmol/L (ref 135–145)

## 2023-06-29 LAB — CBC WITH DIFFERENTIAL/PLATELET
Abs Immature Granulocytes: 0.07 10*3/uL (ref 0.00–0.07)
Basophils Absolute: 0.1 10*3/uL (ref 0.0–0.1)
Basophils Relative: 0 %
Eosinophils Absolute: 0.2 10*3/uL (ref 0.0–0.5)
Eosinophils Relative: 1 %
HCT: 32.2 % — ABNORMAL LOW (ref 36.0–46.0)
Hemoglobin: 10.3 g/dL — ABNORMAL LOW (ref 12.0–15.0)
Immature Granulocytes: 1 %
Lymphocytes Relative: 21 %
Lymphs Abs: 3.2 10*3/uL (ref 0.7–4.0)
MCH: 30.1 pg (ref 26.0–34.0)
MCHC: 32 g/dL (ref 30.0–36.0)
MCV: 94.2 fL (ref 80.0–100.0)
Monocytes Absolute: 1.1 10*3/uL — ABNORMAL HIGH (ref 0.1–1.0)
Monocytes Relative: 8 %
Neutro Abs: 10.3 10*3/uL — ABNORMAL HIGH (ref 1.7–7.7)
Neutrophils Relative %: 69 %
Platelets: 359 10*3/uL (ref 150–400)
RBC: 3.42 MIL/uL — ABNORMAL LOW (ref 3.87–5.11)
RDW: 12.6 % (ref 11.5–15.5)
WBC: 14.8 10*3/uL — ABNORMAL HIGH (ref 4.0–10.5)
nRBC: 0 % (ref 0.0–0.2)

## 2023-06-29 LAB — HEMOGLOBIN A1C
Hgb A1c MFr Bld: 7.1 % — ABNORMAL HIGH (ref 4.8–5.6)
Mean Plasma Glucose: 157.07 mg/dL

## 2023-06-29 LAB — CBG MONITORING, ED: Glucose-Capillary: 128 mg/dL — ABNORMAL HIGH (ref 70–99)

## 2023-06-29 SURGERY — CYSTOSCOPY/URETEROSCOPY/HOLMIUM LASER/STENT PLACEMENT
Anesthesia: General | Laterality: Bilateral

## 2023-06-29 MED ORDER — PHENYLEPHRINE HCL (PRESSORS) 10 MG/ML IV SOLN
INTRAVENOUS | Status: DC | PRN
  Administered 2023-06-29 (×3): 80 ug via INTRAVENOUS
  Administered 2023-06-29: 160 ug via INTRAVENOUS

## 2023-06-29 MED ORDER — FENTANYL CITRATE PF 50 MCG/ML IJ SOSY
PREFILLED_SYRINGE | INTRAMUSCULAR | Status: AC
  Filled 2023-06-29: qty 1

## 2023-06-29 MED ORDER — PHENYLEPHRINE 80 MCG/ML (10ML) SYRINGE FOR IV PUSH (FOR BLOOD PRESSURE SUPPORT)
PREFILLED_SYRINGE | INTRAVENOUS | Status: AC
  Filled 2023-06-29: qty 10

## 2023-06-29 MED ORDER — ZOLPIDEM TARTRATE 5 MG PO TABS
5.0000 mg | ORAL_TABLET | Freq: Once | ORAL | Status: DC
  Filled 2023-06-29: qty 1

## 2023-06-29 MED ORDER — DEXAMETHASONE SODIUM PHOSPHATE 10 MG/ML IJ SOLN
INTRAMUSCULAR | Status: AC
  Filled 2023-06-29: qty 1

## 2023-06-29 MED ORDER — FENTANYL CITRATE (PF) 100 MCG/2ML IJ SOLN
INTRAMUSCULAR | Status: AC
  Filled 2023-06-29: qty 2

## 2023-06-29 MED ORDER — LIDOCAINE HCL (PF) 2 % IJ SOLN
INTRAMUSCULAR | Status: AC
  Filled 2023-06-29: qty 5

## 2023-06-29 MED ORDER — ZOLPIDEM TARTRATE 5 MG PO TABS
5.0000 mg | ORAL_TABLET | Freq: Once | ORAL | Status: AC
  Administered 2023-06-29: 5 mg via ORAL
  Filled 2023-06-29: qty 1

## 2023-06-29 MED ORDER — IOHEXOL 300 MG/ML  SOLN
INTRAMUSCULAR | Status: DC | PRN
  Administered 2023-06-29: 12 mL

## 2023-06-29 MED ORDER — FENTANYL CITRATE PF 50 MCG/ML IJ SOSY: 25.0000 ug | PREFILLED_SYRINGE | INTRAMUSCULAR | Status: DC | PRN

## 2023-06-29 MED ORDER — MIDAZOLAM HCL 5 MG/5ML IJ SOLN
INTRAMUSCULAR | Status: DC | PRN
  Administered 2023-06-29: 2 mg via INTRAVENOUS

## 2023-06-29 MED ORDER — ONDANSETRON HCL 4 MG/2ML IJ SOLN
INTRAMUSCULAR | Status: AC
  Filled 2023-06-29: qty 2

## 2023-06-29 MED ORDER — PROPOFOL 10 MG/ML IV BOLUS
INTRAVENOUS | Status: AC
  Filled 2023-06-29: qty 20

## 2023-06-29 MED ORDER — ACETAMINOPHEN 500 MG PO TABS
1000.0000 mg | ORAL_TABLET | Freq: Once | ORAL | Status: AC
  Administered 2023-06-29: 1000 mg via ORAL

## 2023-06-29 MED ORDER — HYDROMORPHONE HCL 1 MG/ML IJ SOLN
1.0000 mg | INTRAMUSCULAR | Status: DC | PRN
  Administered 2023-06-29: 1 mg via INTRAVENOUS
  Filled 2023-06-29: qty 1

## 2023-06-29 MED ORDER — FENTANYL CITRATE PF 50 MCG/ML IJ SOSY
50.0000 ug | PREFILLED_SYRINGE | Freq: Once | INTRAMUSCULAR | Status: AC
  Administered 2023-06-29: 50 ug via INTRAVENOUS

## 2023-06-29 MED ORDER — SODIUM CHLORIDE 0.9 % IR SOLN
Status: DC | PRN
  Administered 2023-06-29: 3000 mL via INTRAVESICAL

## 2023-06-29 MED ORDER — ZOLPIDEM TARTRATE 5 MG PO TABS
5.0000 mg | ORAL_TABLET | Freq: Every evening | ORAL | Status: DC | PRN
  Administered 2023-06-29: 5 mg via ORAL

## 2023-06-29 MED ORDER — SODIUM CHLORIDE 0.9 % IV SOLN
1.0000 g | INTRAVENOUS | Status: DC
  Administered 2023-06-29 – 2023-06-30 (×2): 1 g via INTRAVENOUS
  Filled 2023-06-29 (×2): qty 10

## 2023-06-29 MED ORDER — PROPOFOL 10 MG/ML IV BOLUS
INTRAVENOUS | Status: DC | PRN
  Administered 2023-06-29: 150 mg via INTRAVENOUS

## 2023-06-29 MED ORDER — ONDANSETRON HCL 4 MG/2ML IJ SOLN
INTRAMUSCULAR | Status: DC | PRN
  Administered 2023-06-29: 4 mg via INTRAVENOUS

## 2023-06-29 MED ORDER — MIDAZOLAM HCL 2 MG/2ML IJ SOLN
INTRAMUSCULAR | Status: AC
  Filled 2023-06-29: qty 2

## 2023-06-29 MED ORDER — LIDOCAINE HCL (CARDIAC) PF 100 MG/5ML IV SOSY
PREFILLED_SYRINGE | INTRAVENOUS | Status: DC | PRN
  Administered 2023-06-29: 60 mg via INTRATRACHEAL

## 2023-06-29 MED ORDER — SODIUM CHLORIDE 0.9 % IV SOLN: INTRAVENOUS | Status: DC

## 2023-06-29 MED ORDER — HYDROMORPHONE HCL 1 MG/ML IJ SOLN
0.5000 mg | INTRAMUSCULAR | Status: DC | PRN
  Administered 2023-06-29: 0.5 mg via INTRAVENOUS
  Filled 2023-06-29: qty 1

## 2023-06-29 MED ORDER — DEXAMETHASONE SODIUM PHOSPHATE 10 MG/ML IJ SOLN
INTRAMUSCULAR | Status: DC | PRN
  Administered 2023-06-29: 6 mg via INTRAVENOUS

## 2023-06-29 SURGICAL SUPPLY — 21 items
BAG URO CATCHER STRL LF (MISCELLANEOUS) ×1 IMPLANT
BASKET LASER NITINOL 1.9FR (BASKET) IMPLANT
BASKET ZERO TIP NITINOL 2.4FR (BASKET) IMPLANT
CATH URETERAL DUAL LUMEN 10F (MISCELLANEOUS) IMPLANT
CATH URETL OPEN END 6FR 70 (CATHETERS) ×1 IMPLANT
CLOTH BEACON ORANGE TIMEOUT ST (SAFETY) ×1 IMPLANT
EXTRACTOR STONE 1.7FRX115CM (UROLOGICAL SUPPLIES) IMPLANT
GLOVE BIO SURGEON STRL SZ7.5 (GLOVE) ×1 IMPLANT
GOWN STRL REUS W/ TWL XL LVL3 (GOWN DISPOSABLE) ×1 IMPLANT
GUIDEWIRE ANG ZIPWIRE 038X150 (WIRE) IMPLANT
GUIDEWIRE STR DUAL SENSOR (WIRE) ×1 IMPLANT
KIT TURNOVER KIT A (KITS) IMPLANT
LASER FIB FLEXIVA PULSE ID 365 (Laser) IMPLANT
MANIFOLD NEPTUNE II (INSTRUMENTS) ×1 IMPLANT
PACK CYSTO (CUSTOM PROCEDURE TRAY) ×1 IMPLANT
SHEATH NAVIGATOR HD 11/13X28 (SHEATH) IMPLANT
SHEATH NAVIGATOR HD 11/13X36 (SHEATH) IMPLANT
STENT URET 6FRX24 CONTOUR (STENTS) IMPLANT
TRACTIP FLEXIVA PULS ID 200XHI (Laser) IMPLANT
TUBING CONNECTING 10 (TUBING) ×1 IMPLANT
TUBING UROLOGY SET (TUBING) ×1 IMPLANT

## 2023-06-29 NOTE — ED Notes (Signed)
 Phlebotomy notified of several unsuccessful attempts to draw blood cultures.

## 2023-06-29 NOTE — ED Notes (Signed)
 Patient resting in hallway bed breathing eyes closed

## 2023-06-29 NOTE — Progress Notes (Signed)
 PROGRESS NOTE  Mindy Richard  ZOX:[id number] DOB: 1955-10-23 DOA: 06/28/2023 PCP: Augustus Blood, DO   Brief Narrative: Patient is a 68 year old female with history of hypertension, nephrolithiasis, hyperlipidemia, type 2 diabetes, OSA, depression/anxiety who presented with abdomen pain on the right flank, nausea, vomiting.  On presentation, she was hemodynamically stable, afebrile.  Lab work showed AKI with creatinine of 1.8, previous creatinine was 1.1.  WBC count was 15.2.  CT abdomen/pelvis showed  4 mm proximal right ureteral calculus with mild right hydronephrosis, 1 cm stone in the left renal pelvis. No hydronephrosis on the left.renal ultrasound showed mild right-sided hydronephrosis.  Urology consulted.  Possible plan for ureteral stent placement  Assessment & Plan:  Principal Problem:   Nephrolithiasis Active Problems:   Hypertension   Depression, major, in remission (HCC)   Neuropathy, diabetic (HCC)   OSA (obstructive sleep apnea)   Hydronephrosis   HLD (hyperlipidemia)   T2DM (type 2 diabetes mellitus) (HCC)   Right-sided nephrolithiasis with mild hydronephrosis: Urology consulted.  CT imaging as above.  Continue pain management, supportive care.  Started on Flomax .  Urology already following.  Possible plan for ureteral stent placement.  Abdominal pain: Presented with right-sided abdominal pain.  Secondary nephrolithiasis.  Continue supportive care, pain management, antiemetics  Sepsis/UTI: Possibility of complicated UTI.  Presented with leukocytosis, elevated lactate.  Continue IV fluids.  Follow-up cultures.  Started on ceftriaxone .    AKI: Presented with creatinine of 1.8.  Continue IV fluid.  Baseline creatinine normal.  Check BMP tomorrow  Type 2 diabetes: Currently on sliding scale and Lantus .  Monitor blood sugars  Hypertension: Takes Cozaar  at home, currently on hold due to AKI.  Continue to monitor.  Blood pressure soft  Hyperlipidemia: On  Lipitor  Depression/anxiety: Effexor , Wellbutrin         DVT prophylaxis:enoxaparin  (LOVENOX ) injection 40 mg Start: 06/29/23 1000     Code Status: Full Code  Family Communication: None at the bedside  Patient status: Inpatient   Patient is from :home  Anticipated discharge WJ:XBJY  Estimated DC date:tomorrow   Consultants: Urology  Procedures: None yet   Antimicrobials:  Anti-infectives (From admission, onward)    Start     Dose/Rate Route Frequency Ordered Stop   06/29/23 2000  cefTRIAXone  (ROCEPHIN ) 1 g in sodium chloride  0.9 % 100 mL IVPB  Status:  Discontinued        1 g 200 mL/hr over 30 Minutes Intravenous Every 24 hours 06/28/23 2244 06/28/23 2304   06/28/23 2304  cefTRIAXone  (ROCEPHIN ) 1 g in sodium chloride  0.9 % 100 mL IVPB  Status:  Discontinued        1 g 200 mL/hr over 30 Minutes Intravenous Every 24 hours 06/28/23 2304 06/29/23 0054       Subjective: Patient seen and examined at bedside today.  During my evaluation, she was overall comfortable but complains of right flank pain.  No nausea or vomiting.  Afebrile.  Objective: Vitals:   06/28/23 1928 06/29/23 0000 06/29/23 0449 06/29/23 0811  BP: 138/81  (!) 119/57 107/65  Pulse: 84  85 81  Resp: 18  17 18   Temp: 98.4 F (36.9 C) 98.2 F (36.8 C) 98.4 F (36.9 C) 98.5 F (36.9 C)  TempSrc: Oral  Oral Oral  SpO2: 96%  94% 92%   No intake or output data in the 24 hours ending 06/29/23 0820 There were no vitals filed for this visit.  Examination:  General exam: Overall comfortable, not in distress HEENT: PERRL  Respiratory system:  no wheezes or crackles  Cardiovascular system: S1 & S2 heard, RRR.  Gastrointestinal system: Abdomen is nondistended, soft and has tenderness on the right flank Central nervous system: Alert and oriented Extremities: No edema, no clubbing ,no cyanosis Skin: No rashes, no ulcers,no icterus     Data Reviewed: I have personally reviewed following labs and  imaging studies  CBC: Recent Labs  Lab 06/27/23 1812 06/28/23 2018 06/29/23 0450  WBC 12.4* 15.2* 14.8*  NEUTROABS 8.7* 10.9* 10.3*  HGB 10.4* 10.7* 10.3*  HCT 33.5* 33.8* 32.2*  MCV 95.2 94.7 94.2  PLT 371 410* 359   Basic Metabolic Panel: Recent Labs  Lab 06/27/23 1812 06/28/23 2018 06/29/23 0450  NA 136 136 137  K 4.2 4.5 4.1  CL 106 105 107  CO2 19* 20* 22  GLUCOSE 195* 289* 130*  BUN 19 24* 22  CREATININE 1.16* 1.81* 1.81*  CALCIUM  9.2 9.0 8.8*     No results found for this or any previous visit (from the past 240 hours).   Radiology Studies: US  RENAL Result Date: 06/28/2023 CLINICAL DATA:  Right flank pain. EXAM: RENAL / URINARY TRACT ULTRASOUND COMPLETE COMPARISON:  None Available. FINDINGS: Right Kidney: Renal measurements: 10.3 cm x 4.8 cm x 5.8 cm = volume: 148.7 mL. Echogenicity within normal limits. No mass is visualized. There is mild right-sided hydronephrosis. Left Kidney: Renal measurements: 9.2 cm x 4.7 cm x 5.5 cm = volume: 124.5 mL. Echogenicity within normal limits. No mass or hydronephrosis visualized. Bladder: The urinary bladder is empty and subsequently limited in evaluation. Other: None. IMPRESSION: Mild right-sided hydronephrosis. Electronically Signed   By: Virgle Grime M.D.   On: 06/28/2023 22:21   CT ABDOMEN PELVIS W CONTRAST Result Date: 06/27/2023 CLINICAL DATA:  Right lower quadrant abdominal pain. EXAM: CT ABDOMEN AND PELVIS WITH CONTRAST TECHNIQUE: Multidetector CT imaging of the abdomen and pelvis was performed using the standard protocol following bolus administration of intravenous contrast. RADIATION DOSE REDUCTION: This exam was performed according to the departmental dose-optimization program which includes automated exposure control, adjustment of the mA and/or kV according to patient size and/or use of iterative reconstruction technique. CONTRAST:  75mL OMNIPAQUE  IOHEXOL  300 MG/ML  SOLN COMPARISON:  CT abdomen pelvis dated  05/30/2023. FINDINGS: Lower chest: The visualized lung bases are clear. No intra-abdominal free air or free fluid. Hepatobiliary: The liver is unremarkable. No biliary dilatation. Cholecystectomy. Pancreas: Unremarkable. No pancreatic ductal dilatation or surrounding inflammatory changes. Spleen: Normal in size without focal abnormality. Adrenals/Urinary Tract: The adrenal glands are unremarkable. The 4 mm stone seen in the inferior pole of the right kidney on prior CT is not located in the proximal right ureter. There is mild right hydronephrosis. A 1 cm stone in the upper pole of the left kidney on the prior CT is not located in the left renal pelvis. There is mild left pelviectasis without hydronephrosis. The urinary bladder is collapsed. Stomach/Bowel: There is no bowel obstruction or active inflammation. The appendix is normal. Vascular/Lymphatic: The abdominal aorta and IVC are unremarkable. No portal venous gas. There is no adenopathy. Reproductive: The uterus is grossly unremarkable. No suspicious adnexal masses. Other: None Musculoskeletal: Degenerative changes of spine. No acute osseous pathology. IMPRESSION: 1. A 4 mm proximal right ureteral calculus with mild right hydronephrosis. 2. A 1 cm stone in the left renal pelvis. No hydronephrosis on the left. 3. No bowel obstruction. Normal appendix. Electronically Signed   By: Angus Bark M.D.   On: 06/27/2023 20:17  Scheduled Meds:  atorvastatin   40 mg Oral QHS   buPROPion   300 mg Oral Daily   enoxaparin  (LOVENOX ) injection  40 mg Subcutaneous Q24H   HYDROcodone -acetaminophen   1 tablet Oral Once   insulin  aspart  0-15 Units Subcutaneous TID WC   insulin  aspart  0-5 Units Subcutaneous QHS   insulin  glargine-yfgn  7 Units Subcutaneous Daily   pantoprazole   40 mg Oral Daily   tamsulosin   0.4 mg Oral QPM   venlafaxine  XR  150 mg Oral Q breakfast   Continuous Infusions:   LOS: 1 day   Leona Rake, MD [facility] P2/02/2024,  8:20 AM

## 2023-06-29 NOTE — Anesthesia Postprocedure Evaluation (Signed)
 Anesthesia Post Note  Patient: Mindy Richard  Procedure(s) Performed: CYSTOSCOPY/URETEROSCOPY/STENT PLACEMENT (Bilateral)     Patient location during evaluation: PACU Anesthesia Type: General Level of consciousness: awake and alert Pain management: pain level controlled Vital Signs Assessment: post-procedure vital signs reviewed and stable Respiratory status: spontaneous breathing, nonlabored ventilation, respiratory function stable and patient connected to nasal cannula oxygen Cardiovascular status: blood pressure returned to baseline and stable Postop Assessment: no apparent nausea or vomiting Anesthetic complications: no  No notable events documented.  Last Vitals:  Vitals:   06/29/23 1345 06/29/23 1407  BP: 109/66 128/61  Pulse: 85 79  Resp: 10 16  Temp: 36.7 C 37 C  SpO2: 93% 98%    Last Pain:  Vitals:   06/29/23 1345  TempSrc:   PainSc: 0-No pain                 Byard Carranza L Ayra Hodgdon

## 2023-06-29 NOTE — ED Notes (Signed)
 Patient asked if she could be moved to a room and not be in the hallway at this time. Charge aware of patient request. No bed available for patient to be moved at this time.

## 2023-06-29 NOTE — ED Notes (Addendum)
 Patient resting in hallway bed breathing eyes closed with husband at bedside

## 2023-06-29 NOTE — Anesthesia Preprocedure Evaluation (Addendum)
 Anesthesia Evaluation  Patient identified by MRN, date of birth, ID band Patient awake    Reviewed: Allergy & Precautions, NPO status , Patient's Chart, lab work & pertinent test results  Airway Mallampati: I  TM Distance: >3 FB Neck ROM: Full    Dental no notable dental hx. (+) Teeth Intact, Dental Advisory Given   Pulmonary sleep apnea (no CPAP)    Pulmonary exam normal breath sounds clear to auscultation       Cardiovascular hypertension, Pt. on medications Normal cardiovascular exam Rhythm:Regular Rate:Normal  TTE 2021  1. Left ventricular ejection fraction, by estimation, is 60 to 65%. The  left ventricle has normal function. The left ventricle has no regional  wall motion abnormalities. There is mild left ventricular hypertrophy.  Left ventricular diastolic parameters  are consistent with Grade I diastolic dysfunction (impaired relaxation).  The E/e' is 9.   2. Right ventricular systolic function is normal. The right ventricular  size is normal.   3. The mitral valve is normal in structure. No evidence of mitral valve  regurgitation.   4. The aortic valve is tricuspid. Aortic valve regurgitation is not  visualized.   5. The inferior vena cava is normal in size with greater than 50%  respiratory variability, suggesting right atrial pressure of 3 mmHg.      Neuro/Psych  PSYCHIATRIC DISORDERS Anxiety Depression    negative neurological ROS     GI/Hepatic Neg liver ROS, hiatal hernia,GERD  ,,  Endo/Other  diabetes, Type 2, Oral Hypoglycemic Agents, Insulin  Dependent    Renal/GU Renal InsufficiencyRenal diseaseLab Results      Component                Value               Date                      NA                       137                 06/29/2023                CL                       107                 06/29/2023                K                        4.1                 06/29/2023                CO2                       22                  06/29/2023                BUN                      22                  06/29/2023  CREATININE               1.81 (H)            06/29/2023                GFRNONAA                 30 (L)              06/29/2023                CALCIUM                   8.8 (L)             06/29/2023                ALBUMIN                  3.6                 06/28/2023                GLUCOSE                  130 (H)             06/29/2023             negative genitourinary   Musculoskeletal negative musculoskeletal ROS (+)    Abdominal   Peds  Hematology negative hematology ROS (+)   Anesthesia Other Findings   Reproductive/Obstetrics                             Anesthesia Physical Anesthesia Plan  ASA: 3  Anesthesia Plan: General   Post-op Pain Management: Tylenol  PO (pre-op)*   Induction: Intravenous  PONV Risk Score and Plan: 3 and Ondansetron , Dexamethasone  and Midazolam   Airway Management Planned: LMA  Additional Equipment:   Intra-op Plan:   Post-operative Plan: Extubation in OR  Informed Consent: I have reviewed the patients History and Physical, chart, labs and discussed the procedure including the risks, benefits and alternatives for the proposed anesthesia with the patient or authorized representative who has indicated his/her understanding and acceptance.     Dental advisory given  Plan Discussed with: CRNA  Anesthesia Plan Comments:        Anesthesia Quick Evaluation

## 2023-06-29 NOTE — Interval H&P Note (Signed)
 History and Physical Interval Note:  06/29/2023 12:13 PM  Mindy Richard  has presented today for surgery, with the diagnosis of right ureteral stone, left renal pelvis stone.  The various methods of treatment have been discussed with the patient and family. After consideration of risks, benefits and other options for treatment, the patient has consented to  Procedure(s): CYSTOSCOPY/URETEROSCOPY/HOLMIUM LASER/STENT PLACEMENT (Bilateral) as a surgical intervention.  The patient's history has been reviewed, patient examined, no change in status, stable for surgery.  I have reviewed the patient's chart and labs.  Questions were answered to the patient's satisfaction.     Maralyn Sender, III

## 2023-06-29 NOTE — Transfer of Care (Signed)
 Immediate Anesthesia Transfer of Care Note  Patient: Mindy Richard  Procedure(s) Performed: CYSTOSCOPY/URETEROSCOPY/HOLMIUM LASER/STENT PLACEMENT (Bilateral)  Patient Location: PACU  Anesthesia Type:General  Level of Consciousness: awake  Airway & Oxygen Therapy: Patient Spontanous Breathing and Patient connected to face mask oxygen  Post-op Assessment: Report given to RN and Post -op Vital signs reviewed and stable  Post vital signs: Reviewed and stable  Last Vitals:  Vitals Value Taken Time  BP 124/73 06/29/23 1312  Temp 36.7 C 06/29/23 1312  Pulse 92 06/29/23 1314  Resp 21 06/29/23 1314  SpO2 100 % 06/29/23 1314  Vitals shown include unfiled device data.  Last Pain:  Vitals:   06/29/23 1312  TempSrc:   PainSc: Asleep         Complications: No notable events documented.

## 2023-06-29 NOTE — Op Note (Signed)
 Operative Note  Preoperative diagnosis:  1.  Right ureteral calculus, left ureteropelvic junction calculus  Postoperative diagnosis: 1.  Same  Procedure(s): 1.  Cystoscopy with bilateral retrograde pyelogram, bilateral ureteral stent placement  Surgeon: Leila Punt, MD  Assistants: None  Anesthesia: General  Complications: None immediate  EBL: None  Specimens: 1.  None  Drains/Catheters: 1.  Bilateral 6 x 24 double-J ureteral stent  Intraoperative findings: 1.  Normal urethra and bladder 2.  Left retrograde pyelogram revealed a filling defect at the left ureteropelvic junction.  There was no significant hydronephrosis on the left. 3.  Right retrograde pyelogram revealed filling defect at the level of the stone and minimal hydronephrosis  Indication: 68 year old female with a right ureteral calculus and left ureteropelvic junction calculus presents for urgent stent placement  Description of procedure:  The patient was identified and consent was obtained.  The patient was taken to the operating room and placed in the supine position.  The patient was placed under general anesthesia.  Perioperative antibiotics were administered.  The patient was placed in dorsal lithotomy.  Patient was prepped and draped in a standard sterile fashion and a timeout was performed.  A 21 French rigid cystoscope was Darolyn Elks into the urethra and into the bladder.  Complete cystoscopy was performed with no abnormal findings.  The left ureter was cannulated with an open-ended ureteral catheter and a retrograde pyelogram was performed with findings noted above.  A wire was advanced up the left ureter and into the kidney under fluoroscopic guidance followed by routine placement of a 6 x 24 double-J ureteral stent.  Fluoroscopy confirmed proximal placement and direct visualization confirmed a good coil within the bladder.    I intubated the right ureteral orifice with an open-ended ureteral catheter.   Retrograde pyelogram was performed with findings noted above.  A sensor wire was advanced up the right ureter and into the kidney under fluoroscopic guidance followed by routine placement of a 6 x 24 double-J ureteral stent.  Fluoroscopy confirmed proximal placement and direct visualization confirmed a good coil within the bladder.  I drained the bladder and withdrew the scope.  Patient tolerated the procedure well was stable postoperatively.  Plan: Follow-up in 1 to 2 weeks for definitive stone management.

## 2023-06-29 NOTE — ED Notes (Signed)
 me and the charge nurse sarah attempted twice to get her blood culture and was not successful

## 2023-06-29 NOTE — Anesthesia Procedure Notes (Signed)
 Procedure Name: LMA Insertion Date/Time: 06/29/2023 12:43 PM  Performed by: Mertie Abt, CRNAPre-anesthesia Checklist: Patient identified, Patient being monitored, Timeout performed, Emergency Drugs available and Suction available Patient Re-evaluated:Patient Re-evaluated prior to induction Oxygen Delivery Method: Circle system utilized Preoxygenation: Pre-oxygenation with 100% oxygen Induction Type: IV induction Ventilation: Mask ventilation without difficulty LMA: LMA inserted LMA Size: 4.0 Tube type: Oral Number of attempts: 1 Placement Confirmation: positive ETCO2 and breath sounds checked- equal and bilateral Tube secured with: Tape Dental Injury: Teeth and Oropharynx as per pre-operative assessment

## 2023-06-29 NOTE — Progress Notes (Signed)
 Urology Consult Note   Requesting Attending Physician:  Leona Rake, MD Service Providing Consult: Urology  Consulting Attending: Leila Punt  Reason for Consult:  Nephrolithiasis  HPI: Mindy Richard is seen in consultation for reasons noted above at the request of Leona Rake, MD for evalaution of bilateral stone burden.   This is a 68 y.o. female with past medical history significant for anxiety, depression, nephrolithiasis, who presented to the ED over the weekend and was found to have bilateral stone burden.  She was sent home by the emergency department on medical expulsion therapy.  She returns today given poor pain control.  Her CT scan from yesterday shows a 5 mm right proximal stone, and a left 1 cm left UPJ stone.  Her white blood cell count is at 15.  Her urinalysis is not overtly concerning for infection, with 11-20 white blood cells, greater than 50 red blood cells, and no bacterial seen, LE and nitrite negative.   Her kidney function is decreased, with creatinine at 1.8, up from 1.2 over the weekend.  Urology consulted given failed MET.    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   Wears glasses     Past Surgical History:  Past Surgical History:  Procedure Laterality Date   CARPAL TUNNEL RELEASE Bilateral    left 2017;  right 2001   CESAREAN SECTION  1985   CHOLECYSTECTOMY  09/02/2011   Procedure: LAPAROSCOPIC  CHOLECYSTECTOMY WITH INTRAOPERATIVE CHOLANGIOGRAM;  Surgeon: Darcella Earnest, MD;  Location: [facility] OR;  Service: General;  Laterality: N/A;   COLONOSCOPY     CYSTOSCOPY/URETEROSCOPY/HOLMIUM LASER/STENT PLACEMENT Left 05/08/2021   Procedure: LEFT /URETEROSCOPY/HOLMIUM LASER/STENT PLACEMENT;  Surgeon: Mallie Seal, MD;  Location: [facility];  Service: Urology;  Laterality: Left;   DILATION AND CURETTAGE OF UTERUS  1984   ERCP  09/03/2011   Procedure: ENDOSCOPIC RETROGRADE CHOLANGIOPANCREATOGRAPHY (ERCP);  Surgeon: Barbie Boon, MD;  Location: [facility] ENDOSCOPY;  Service: Endoscopy;  Laterality: N/A;  sphincterotomy and stone extraction   SHOULDER ARTHROSCOPY WITH SUBACROMIAL DECOMPRESSION Left 10/02/2010   @[facility]  by dr Mevelyn Acton;   w/ closed manipulation   TONSILLECTOMY  1970   TUBAL LIGATION Bilateral 1985    Medication: Current Facility-Administered Medications  Medication Dose Route Frequency Provider Last Rate Last Admin   acetaminophen  (TYLENOL ) tablet 650 mg  650 mg Oral Q6H PRN Crosley, Debby, MD   650 mg at 06/28/23 2341   Or   acetaminophen  (TYLENOL ) suppository 650 mg  650 mg Rectal Q6H PRN Crosley, Debby, MD       atorvastatin  (LIPITOR) tablet 40 mg  40 mg Oral QHS Crosley, Debby, MD   40 mg at 06/28/23 2342   buPROPion  (WELLBUTRIN  XL) 24 hr tablet 300 mg  300 mg Oral Daily Crosley, Debby, MD       clonazePAM  (KLONOPIN ) tablet 1  mg  1 mg Oral Daily PRN Corrinne Din, MD       enoxaparin  (LOVENOX ) injection 40 mg  40 mg Subcutaneous Q24H Crosley, Debby, MD       hydrALAZINE  (APRESOLINE ) injection 5 mg  5 mg Intravenous Q4H PRN Crosley, Debby, MD       HYDROcodone -acetaminophen  (NORCO/VICODIN) 5-325 MG per tablet 1 tablet  1 tablet Oral Once Ceclia Cohens, Amjad, PA-C       HYDROmorphone  (DILAUDID ) injection 0.5 mg  0.5 mg Intravenous Q3H PRN Gerhardt Knudsen, Debby, MD   0.5 mg at 06/29/23 0455   HYDROmorphone  (DILAUDID ) injection 1 mg  1 mg Intravenous Q4H PRN Corrinne Din, MD   1 mg  at 06/29/23 0154   insulin  aspart (novoLOG ) injection 0-15 Units  0-15 Units Subcutaneous TID WC Crosley, Debby, MD       insulin  aspart (novoLOG ) injection 0-5 Units  0-5 Units Subcutaneous QHS Crosley, Debby, MD       insulin  glargine-yfgn (SEMGLEE ) injection 7 Units  7 Units Subcutaneous Daily Crosley, Debby, MD       ondansetron  (ZOFRAN ) tablet 4 mg  4 mg Oral Q6H PRN Crosley, Debby, MD       Or   ondansetron  (ZOFRAN ) injection 4 mg  4 mg Intravenous Q6H PRN Crosley, Debby, MD       pantoprazole  (PROTONIX ) EC tablet 40 mg  40 mg Oral Daily Crosley, Debby, MD       senna-docusate (Senokot-S) tablet 1 tablet  1 tablet Oral QHS PRN Crosley, Debby, MD       tamsulosin  (FLOMAX ) capsule 0.4 mg  0.4 mg Oral QPM Crosley, Debby, MD       venlafaxine  XR (EFFEXOR -XR) 24 hr capsule 150 mg  150 mg Oral Q breakfast Crosley, Debby, MD       zolpidem  (AMBIEN ) tablet 5 mg  5 mg Oral QHS PRN Corrinne Din, MD       Current Outpatient Medications  Medication Sig Dispense Refill   atorvastatin  (LIPITOR) 40 MG tablet Take 1 tablet (40 mg total) by mouth daily. (Patient taking differently: Take 40 mg by mouth at bedtime.) 90 tablet 2   buPROPion  (WELLBUTRIN  XL) 300 MG 24 hr tablet Take 300 mg by mouth daily.      Cholecalciferol  (VITAMIN D ) 2000 units tablet Take 2,000 Units by mouth daily with breakfast.     clonazePAM  (KLONOPIN ) 1 MG tablet Take 1 mg by mouth as needed for anxiety.     diclofenac  sodium (VOLTAREN ) 1 % GEL Apply 2 g topically 4 (four) times daily. (Patient taking differently: Apply 2 g topically 4 (four) times daily as needed (for pain).) 100 g 2   ferrous sulfate 325 (65 FE) MG tablet Take 325 mg by mouth daily with breakfast. M/ W/ F     LANTUS  SOLOSTAR 100 UNIT/ML Solostar Pen INJECT 8 UNITS SUBCUTANEOUSLY EVERY DAY AS DIRECTED OR AS DIRECTED BY PRIMARY CARE PHYSICIAN DISCARD PEN 28 DAYS AFTER OPENING (Patient taking differently: Inject 14 Units into the skin at bedtime.) 15 mL 2   losartan   (COZAAR ) 100 MG tablet TAKE 1/2 TO 1 TABLET DAILY AS DIRECTED FOR BLOOD PRESSURE AND KIDNEY PROTECTION (Patient taking differently: Take 50 mg by mouth daily.) 90 tablet 4   metFORMIN  (GLUCOPHAGE -XR) 500 MG 24 hr tablet Take 1,000 mg by mouth 2 (two) times daily.     mirtazapine (REMERON) 15 MG tablet Take 7.5 mg by mouth at bedtime.     ondansetron  (ZOFRAN -ODT) 4 MG disintegrating tablet Take  1 tablet (4 mg total) by mouth every 8 (eight) hours as needed for nausea. 10 tablet 0   oxyCODONE -acetaminophen  (PERCOCET) 5-325 MG tablet Take 1 tablet by mouth every 4 (four) hours as needed. 20 tablet 0   pantoprazole  (PROTONIX ) 40 MG tablet Take 40 mg by mouth daily.     tamsulosin  (FLOMAX ) 0.4 MG CAPS capsule Take 1 capsule (0.4 mg total) by mouth daily after supper. 10 capsule 0   venlafaxine  XR (EFFEXOR -XR) 150 MG 24 hr capsule Take 150 mg by mouth daily with breakfast.      zolpidem  (AMBIEN ) 10 MG tablet Take 10 mg by mouth at bedtime.      amoxicillin (AMOXIL) 500 MG capsule Take 500 mg by mouth 3 (three) times daily. (Patient not taking: Reported on 06/28/2023)     blood glucose meter kit and supplies Check sugar 3 times a day. DxE11.9Dispense based insurance preference. 1 each 0   cephALEXin  (KEFLEX ) 500 MG capsule Take 1 capsule (500 mg total) by mouth 2 (two) times daily. (Patient not taking: Reported on 06/28/2023) 10 capsule 0   DROPLET PEN NEEDLES 31G X 8 MM MISC USE AS DIRECTED  WITH  LANTUS   INJECTIONS 100 each 3   glucose blood test strip One touch Test blood sugar TID PRN DX insulin  dependent diabetic E11.9 100 each 12   oxyCODONE -acetaminophen  (PERCOCET/ROXICET) 5-325 MG tablet Take 1 tablet by mouth every 4 (four) hours as needed for severe pain (pain score 7-10). 15 tablet 0    Allergies: Allergies  Allergen Reactions   Codeine Other (See Comments)    Makes her head feel "weird"   Glucophage  [Metformin  Hcl] Diarrhea   Nsaids Other (See Comments)    "upset stomach"    Social  History: Social History   Tobacco Use   Smoking status: Never   Smokeless tobacco: Never  Vaping Use   Vaping status: Never Used  Substance Use Topics   Alcohol use: No   Drug use: Never    Family History Family History  Problem Relation Age of Onset   Cancer Maternal Aunt        breast   Heart attack Father 75       Smoker   Diabetes Mother    Hyperlipidemia Mother    Anesthesia problems Neg Hx    Hypotension Neg Hx     Review of Systems 10 systems were reviewed and are negative except as noted specifically in the HPI.  Objective   Vital signs in last 24 hours: BP (!) 119/57 (BP Location: Left Arm)   Pulse 85   Temp 98.4 F (36.9 C) (Oral)   Resp 17   SpO2 94%   Physical Exam General: NAD, A&O, resting, appropriate HEENT: [place]/AT, EOMI, MMM Pulmonary: Normal work of breathing Cardiovascular: HDS, adequate peripheral perfusion Abdomen: Soft, NTTP, nondistended, Nontender. GU: VS, Mild CVA tenderness Extremities: warm and well perfused Neuro: Appropriate, no focal neurological deficits  Most Recent Labs: Lab Results  Component Value Date   WBC 14.8 (H) 06/29/2023   HGB 10.3 (L) 06/29/2023   HCT 32.2 (L) 06/29/2023   PLT 359 06/29/2023    Lab Results  Component Value Date   NA 137 06/29/2023   K 4.1 06/29/2023   CL 107 06/29/2023   CO2 22 06/29/2023   BUN 22 06/29/2023   CREATININE 1.81 (H) 06/29/2023   CALCIUM  8.8 (L) 06/29/2023   MG 1.8 12/29/2017    Lab Results  Component Value Date   INR  0.91 05/09/2011   APTT 24 05/09/2011     Urine Culture: @[id number] (laburin,org,[id number])@   IMAGING: US  RENAL Result Date: 06/28/2023 CLINICAL DATA:  Right flank pain. EXAM: RENAL / URINARY TRACT ULTRASOUND COMPLETE COMPARISON:  None Available. FINDINGS: Right Kidney: Renal measurements: 10.3 cm x 4.8 cm x 5.8 cm = volume: 148.7 mL. Echogenicity within normal limits. No mass is visualized. There is mild right-sided hydronephrosis. Left Kidney: Renal  measurements: 9.2 cm x 4.7 cm x 5.5 cm = volume: 124.5 mL. Echogenicity within normal limits. No mass or hydronephrosis visualized. Bladder: The urinary bladder is empty and subsequently limited in evaluation. Other: None. IMPRESSION: Mild right-sided hydronephrosis. Electronically Signed   By: Virgle Grime M.D.   On: 06/28/2023 22:21   CT ABDOMEN PELVIS W CONTRAST Result Date: 06/27/2023 CLINICAL DATA:  Right lower quadrant abdominal pain. EXAM: CT ABDOMEN AND PELVIS WITH CONTRAST TECHNIQUE: Multidetector CT imaging of the abdomen and pelvis was performed using the standard protocol following bolus administration of intravenous contrast. RADIATION DOSE REDUCTION: This exam was performed according to the departmental dose-optimization program which includes automated exposure control, adjustment of the mA and/or kV according to patient size and/or use of iterative reconstruction technique. CONTRAST:  75mL OMNIPAQUE  IOHEXOL  300 MG/ML  SOLN COMPARISON:  CT abdomen pelvis dated 05/30/2023. FINDINGS: Lower chest: The visualized lung bases are clear. No intra-abdominal free air or free fluid. Hepatobiliary: The liver is unremarkable. No biliary dilatation. Cholecystectomy. Pancreas: Unremarkable. No pancreatic ductal dilatation or surrounding inflammatory changes. Spleen: Normal in size without focal abnormality. Adrenals/Urinary Tract: The adrenal glands are unremarkable. The 4 mm stone seen in the inferior pole of the right kidney on prior CT is not located in the proximal right ureter. There is mild right hydronephrosis. A 1 cm stone in the upper pole of the left kidney on the prior CT is not located in the left renal pelvis. There is mild left pelviectasis without hydronephrosis. The urinary bladder is collapsed. Stomach/Bowel: There is no bowel obstruction or active inflammation. The appendix is normal. Vascular/Lymphatic: The abdominal aorta and IVC are unremarkable. No portal venous gas. There is no  adenopathy. Reproductive: The uterus is grossly unremarkable. No suspicious adnexal masses. Other: None Musculoskeletal: Degenerative changes of spine. No acute osseous pathology. IMPRESSION: 1. A 4 mm proximal right ureteral calculus with mild right hydronephrosis. 2. A 1 cm stone in the left renal pelvis. No hydronephrosis on the left. 3. No bowel obstruction. Normal appendix. Electronically Signed   By: Angus Bark M.D.   On: 06/27/2023 20:17    ------  Assessment:  68 y.o. female with hyperlipidemia, hypertension, and bilateral nephrolithiasis who presents to the emergency department after failed medical expulsion therapy of the right proximal stone, and a subsequent left 1 cm renal pelvis stone  White count is mildly elevated compared to her emergency department visit over this weekend, white blood cell count currently at 15.  She also has a bit more of an AKI, with creatinine of 1.8.  Reassuringly, she is hemodynamically stable without signs of infection.  Given poor pain control and concern for bilateral ureteral obstruction, will plan for interval bilateral stent placement and laser lithotripsy.  Update 06/29/2023:  PT HDS, clinically stable. WBC 14.5, largely unchanged from yesterday Cr stable at 1.8.  Plan for OR today for bilateral URS/LL    Recommendations: - Recommend admission to medicine team - Keep pt NPO - Case posted, will add-on for 06/29/2023 - Please send dedicated urine culture   Alveda Aures  Jolan Natal, MD Resident Physician [facility]    Thank you for this consult. Please contact the urology consult pager with any further questions/concerns.

## 2023-06-30 ENCOUNTER — Encounter (HOSPITAL_COMMUNITY): Payer: Self-pay | Admitting: Urology

## 2023-06-30 DIAGNOSIS — N200 Calculus of kidney: Secondary | ICD-10-CM | POA: Diagnosis not present

## 2023-06-30 LAB — BASIC METABOLIC PANEL
Anion gap: 8 (ref 5–15)
BUN: 16 mg/dL (ref 8–23)
CO2: 20 mmol/L — ABNORMAL LOW (ref 22–32)
Calcium: 7.7 mg/dL — ABNORMAL LOW (ref 8.9–10.3)
Chloride: 109 mmol/L (ref 98–111)
Creatinine, Ser: 1.35 mg/dL — ABNORMAL HIGH (ref 0.44–1.00)
GFR, Estimated: 43 mL/min — ABNORMAL LOW (ref >60)
Glucose, Bld: 126 mg/dL — ABNORMAL HIGH (ref 70–99)
Potassium: 4 mmol/L (ref 3.5–5.1)
Sodium: 137 mmol/L (ref 135–145)

## 2023-06-30 LAB — CBC
HCT: 27.3 % — ABNORMAL LOW (ref 36.0–46.0)
Hemoglobin: 8.6 g/dL — ABNORMAL LOW (ref 12.0–15.0)
MCH: 30.1 pg (ref 26.0–34.0)
MCHC: 31.5 g/dL (ref 30.0–36.0)
MCV: 95.5 fL (ref 80.0–100.0)
Platelets: 278 10*3/uL (ref 150–400)
RBC: 2.86 MIL/uL — ABNORMAL LOW (ref 3.87–5.11)
RDW: 12.5 % (ref 11.5–15.5)
WBC: 10.5 10*3/uL (ref 4.0–10.5)
nRBC: 0 % (ref 0.0–0.2)

## 2023-06-30 LAB — GLUCOSE, CAPILLARY: Glucose-Capillary: 158 mg/dL — ABNORMAL HIGH (ref 70–99)

## 2023-06-30 MED ORDER — TAMSULOSIN HCL 0.4 MG PO CAPS: 0.4000 mg | ORAL_CAPSULE | Freq: Every day | ORAL | 0 refills | Status: AC

## 2023-06-30 MED ORDER — FOSFOMYCIN TROMETHAMINE 3 G PO PACK
3.0000 g | PACK | Freq: Once | ORAL | Status: DC
  Filled 2023-06-30: qty 3

## 2023-06-30 MED ORDER — OXYBUTYNIN CHLORIDE 5 MG PO TABS: 5.0000 mg | ORAL_TABLET | Freq: Three times a day (TID) | ORAL | 0 refills | Status: AC | PRN

## 2023-06-30 MED ORDER — OXYBUTYNIN CHLORIDE 5 MG PO TABS: 5.0000 mg | ORAL_TABLET | Freq: Three times a day (TID) | ORAL | Status: DC | PRN

## 2023-06-30 NOTE — Progress Notes (Signed)
   06/30/23 1027  TOC Brief Assessment  Insurance and Status Reviewed  Patient has primary care physician Yes  Home environment has been reviewed Home w/ spouse  Prior level of function: Independent  Prior/Current Home Services No current home services  Social Drivers of Health Review SDOH reviewed no interventions necessary  Readmission risk has been reviewed Yes  Transition of care needs no transition of care needs at this time

## 2023-06-30 NOTE — Progress Notes (Signed)
Urology Consult Note   Requesting Attending Physician:  Burnadette Pop, MD Service Providing Consult: Urology  Consulting Attending: Modena Slater  Reason for Consult:  Nephrolithiasis  HPI: Mindy Richard is seen in consultation for reasons noted above at the request of Burnadette Pop, MD for evalaution of bilateral stone burden.   This is a 68 y.o. female with past medical history significant for anxiety, depression, nephrolithiasis, who presented to the ED over the weekend and was found to have bilateral stone burden.  She was sent home by the emergency department on medical expulsion therapy.  She returns today given poor pain control.  Her CT scan from yesterday shows a 5 mm right proximal stone, and a left 1 cm left UPJ stone.  Her white blood cell count is at 15.  Her urinalysis is not overtly concerning for infection, with 11-20 white blood cells, greater than 50 red blood cells, and no bacterial seen, LE and nitrite negative.   Her kidney function is decreased, with creatinine at 1.8, up from 1.2 over the weekend.  Urology consulted given failed MET.    Past Medical History: Past Medical History:  Diagnosis Date   Anxiety    Depression    GERD (gastroesophageal reflux disease)    Hiatal hernia    History of kidney stones    Hypercholesteremia    Hypertension    Insulin-requiring or dependent type II diabetes mellitus (HCC)    followed by pcp  (05-06-2021  pt stated check blood sugar daily fasting in am--- fasting sugar--  140-170)   Left ureteral stone    Mild obstructive sleep apnea    followed by dr Vassie Loll---  (05-06-2021  pt stated has not used cpap >67yrs) study in epic very mild osa with delayed sleep phase syndrome   Wears glasses     Past Surgical History:  Past Surgical History:  Procedure Laterality Date   CARPAL TUNNEL RELEASE Bilateral    left 2017;  right 2001   CESAREAN SECTION  1985   CHOLECYSTECTOMY  09/02/2011   Procedure: LAPAROSCOPIC  CHOLECYSTECTOMY WITH INTRAOPERATIVE CHOLANGIOGRAM;  Surgeon: Currie Paris, MD;  Location: [facility] OR;  Service: General;  Laterality: N/A;   COLONOSCOPY     CYSTOSCOPY/URETEROSCOPY/HOLMIUM LASER/STENT PLACEMENT Left 05/08/2021   Procedure: LEFT /URETEROSCOPY/HOLMIUM LASER/STENT PLACEMENT;  Surgeon: Despina Arias, MD;  Location: [facility] [place];  Service: Urology;  Laterality: Left;   CYSTOSCOPY/URETEROSCOPY/HOLMIUM LASER/STENT PLACEMENT Bilateral 06/29/2023   Procedure: CYSTOSCOPY/URETEROSCOPY/STENT PLACEMENT;  Surgeon: Crista Elliot, MD;  Location: [facility];  Service: Urology;  Laterality: Bilateral;   DILATION AND CURETTAGE OF UTERUS  1984   ERCP  09/03/2011   Procedure: ENDOSCOPIC RETROGRADE CHOLANGIOPANCREATOGRAPHY (ERCP);  Surgeon: Barrie Folk, MD;  Location: [facility] ENDOSCOPY;  Service: Endoscopy;  Laterality: N/A;  sphincterotomy and stone extraction   SHOULDER ARTHROSCOPY WITH SUBACROMIAL DECOMPRESSION Left 10/02/2010   @[facility]  by dr Simonne Come;   w/ closed manipulation   TONSILLECTOMY  1970   TUBAL LIGATION Bilateral 1985    Medication: Current Facility-Administered Medications  Medication Dose Route Frequency Provider Last Rate Last Admin   0.9 %  sodium chloride infusion   Intravenous Continuous Adhikari, Amrit, MD 100 mL/hr at 06/29/23 1441 New Bag at 06/29/23 1441   acetaminophen (TYLENOL) tablet 650 mg  650 mg Oral Q6H PRN Gery Pray, MD   650 mg at 06/30/23 0417   Or   acetaminophen (TYLENOL) suppository 650 mg  650 mg Rectal Q6H PRN Gery Pray, MD  atorvastatin (LIPITOR) tablet 40 mg  40 mg Oral QHS Crosley, Debby, MD   40 mg at 06/29/23 2110   buPROPion (WELLBUTRIN XL) 24 hr tablet 300 mg  300 mg Oral Daily Gery Pray, MD   300 mg at 06/29/23 0941   cefTRIAXone (ROCEPHIN) 1 g in sodium chloride 0.9 % 100 mL IVPB  1 g Intravenous Q24H Burnadette Pop, MD 200 mL/hr at 06/29/23 0939 1 g at 06/29/23 0939   clonazePAM (KLONOPIN) tablet 1 mg  1 mg Oral  Daily PRN Gery Pray, MD       enoxaparin (LOVENOX) injection 40 mg  40 mg Subcutaneous Q24H Crosley, Debby, MD   40 mg at 06/29/23 0941   hydrALAZINE (APRESOLINE) injection 5 mg  5 mg Intravenous Q4H PRN Crosley, Debby, MD       HYDROmorphone (DILAUDID) injection 0.5 mg  0.5 mg Intravenous Q3H PRN Joneen Roach, Debby, MD   0.5 mg at 06/29/23 0455   HYDROmorphone (DILAUDID) injection 1 mg  1 mg Intravenous Q4H PRN Joneen Roach, Debby, MD   1 mg at 06/29/23 0154   insulin aspart (novoLOG) injection 0-15 Units  0-15 Units Subcutaneous TID WC Crosley, Debby, MD   2 Units at 06/29/23 1749   insulin aspart (novoLOG) injection 0-5 Units  0-5 Units Subcutaneous QHS Crosley, Debby, MD       insulin glargine-yfgn (SEMGLEE) injection 7 Units  7 Units Subcutaneous Daily Crosley, Debby, MD       ondansetron (ZOFRAN) tablet 4 mg  4 mg Oral Q6H PRN Crosley, Debby, MD       Or   ondansetron (ZOFRAN) injection 4 mg  4 mg Intravenous Q6H PRN Crosley, Debby, MD       pantoprazole (PROTONIX) EC tablet 40 mg  40 mg Oral Daily Crosley, Debby, MD   40 mg at 06/29/23 0941   senna-docusate (Senokot-S) tablet 1 tablet  1 tablet Oral QHS PRN Gery Pray, MD       tamsulosin (FLOMAX) capsule 0.4 mg  0.4 mg Oral QPM Crosley, Debby, MD   0.4 mg at 06/29/23 1745   venlafaxine XR (EFFEXOR-XR) 24 hr capsule 150 mg  150 mg Oral Q breakfast Crosley, Debby, MD   150 mg at 06/29/23 0941   zolpidem (AMBIEN) tablet 5 mg  5 mg Oral QHS PRN Gery Pray, MD   5 mg at 06/29/23 2156   zolpidem (AMBIEN) tablet 5 mg  5 mg Oral Once Luiz Iron, NP        Allergies: Allergies  Allergen Reactions   Codeine Other (See Comments)    Makes her head feel "weird"   Glucophage [Metformin Hcl] Diarrhea   Nsaids Other (See Comments)    "upset stomach"    Social History: Social History   Tobacco Use   Smoking status: Never   Smokeless tobacco: Never  Vaping Use   Vaping status: Never Used  Substance Use Topics   Alcohol use: No    Drug use: Never    Family History Family History  Problem Relation Age of Onset   Cancer Maternal Aunt        breast   Heart attack Father 60       Smoker   Diabetes Mother    Hyperlipidemia Mother    Anesthesia problems Neg Hx    Hypotension Neg Hx     Review of Systems 10 systems were reviewed and are negative except as noted specifically in the HPI.  Objective   Vital signs in  last 24 hours: BP 133/69 (BP Location: Left Arm)   Pulse 75   Temp 98.6 F (37 C)   Resp 20   Ht 5\' 3"  (1.6 m)   Wt 63.5 kg   SpO2 96%   BMI 24.80 kg/m   Physical Exam General: NAD, A&O, resting, appropriate HEENT: [place]/AT, EOMI, MMM Pulmonary: Normal work of breathing Cardiovascular: HDS, adequate peripheral perfusion Abdomen: Soft, NTTP, nondistended, Nontender. GU: VS, Mild CVA tenderness Extremities: warm and well perfused Neuro: Appropriate, no focal neurological deficits  Most Recent Labs: Lab Results  Component Value Date   WBC 10.5 06/30/2023   HGB 8.6 (L) 06/30/2023   HCT 27.3 (L) 06/30/2023   PLT 278 06/30/2023    Lab Results  Component Value Date   NA 137 06/29/2023   K 4.1 06/29/2023   CL 107 06/29/2023   CO2 22 06/29/2023   BUN 22 06/29/2023   CREATININE 1.81 (H) 06/29/2023   CALCIUM 8.8 (L) 06/29/2023   MG 1.8 12/29/2017    Lab Results  Component Value Date   INR 0.91 05/09/2011   APTT 24 05/09/2011     Urine Culture: @[id number] (laburin,org,[id number])@   IMAGING: DG C-Arm 1-60 Min-No Report Result Date: 06/29/2023 Fluoroscopy was utilized by the requesting physician.  No radiographic interpretation.   US RENAL Result Date: 06/28/2023 CLINICAL DATA:  Right flank pain. EXAM: RENAL / URINARY TRACT ULTRASOUND COMPLETE COMPARISON:  None Available. FINDINGS: Right Kidney: Renal measurements: 10.3 cm x 4.8 cm x 5.8 cm = volume: 148.7 mL. Echogenicity within normal limits. No mass is visualized. There is mild right-sided hydronephrosis. Left Kidney:  Renal measurements: 9.2 cm x 4.7 cm x 5.5 cm = volume: 124.5 mL. Echogenicity within normal limits. No mass or hydronephrosis visualized. Bladder: The urinary bladder is empty and subsequently limited in evaluation. Other: None. IMPRESSION: Mild right-sided hydronephrosis. Electronically Signed   By: Aram Candela M.D.   On: 06/28/2023 22:21    ------  Assessment:  68 y.o. female with hyperlipidemia, hypertension, and bilateral nephrolithiasis who presents to the emergency department after failed medical expulsion therapy of the right proximal stone, and a subsequent left 1 cm renal pelvis stone  Pt now s/p bilateral stent placement 06/29/2023.   Update 06/30/2023: Pt POD1 s/p bilateral stent placement Pt pleasant this AM - mild left suprapubic discomfort HDS VS without issue BMP this AM pending    Recommendations: - ensure discharge with flomax and ditropan for stent related discomfort - okay to discharge from urology perspective if creatinine downtrending - urology will scheduled definitive stone management in the next few weeks.   Roby Lofts, MD Resident Physician [facility]    Thank you for this consult. Please contact the urology consult pager with any further questions/concerns.

## 2023-06-30 NOTE — Discharge Summary (Signed)
Physician Discharge Summary  Mindy Richard ZOX:[id number] DOB: 03-23-1956 DOA: 06/28/2023  PCP: Theodis Shove, DO  Admit date: 06/28/2023 Discharge date: 06/30/2023  Admitted From: Home Disposition:  Home  Discharge Condition:Stable CODE STATUS:FULL Diet recommendation:  Carb Modified  Brief/Interim Summary: Patient is a 68 year old female with history of hypertension, nephrolithiasis, hyperlipidemia, type 2 diabetes, OSA, depression/anxiety who presented with abdomen pain on the right flank, nausea, vomiting.  On presentation, she was hemodynamically stable, afebrile.  Lab work showed AKI with creatinine of 1.8, previous creatinine was 1.1.  WBC count was 15.2.  CT abdomen/pelvis showed  4 mm proximal right ureteral calculus with mild right hydronephrosis, 1 cm stone in the left renal pelvis. Renal ultrasound showed mild right-sided hydronephrosis.  Urology consulted.  Status post bilateral ureteral stent placement.  Abdominal pain is significantly better today as well as kidney function.  Medically stable for discharge home today.  She will follow-up with urology as an outpatient.  Following problems were addressed during the hospitalization:  Right-sided nephrolithiasis with mild hydronephrosis: Urology consulted.  CT imaging as above.   Urology consulted.  Status post bilateral ureteral stent placement.  Abdominal pain is significantly better today as well as kidney function.  Continue Flomax and Ditropan on discharge.  She will follow with hematology on discharge.   Abdominal pain: Presented with right-sided abdominal pain.  Secondary nephrolithiasis.  Resolved  Sepsis/UTI:Presented with leukocytosis, elevated lactate.Started on ceftriaxone.  Urine culture now showing 20,000 colonies of Enterococcus faecalis.  Patient does not complain of any dysuria.  No fever or leukocytosis today.  Will give a dose of fosfomycin before discharge   AKI: Presented with creatinine of 1.8.   Significantly improved today  Type 2 diabetes: Continue home regimen  Hypertension: Takes Cozaar at home, currently on hold due to AKI.  Blood pressure stable without any medications   Hyperlipidemia: On Lipitor  Normocytic anemia: Hemoglobin dropped from 10 to 8 most likely from hemodilution, she got IV fluids during this hospitalization.   Depression/anxiety: Effexor, Wellbutrin at home  Discharge Diagnoses:  Principal Problem:   Nephrolithiasis Active Problems:   Hypertension   Depression, major, in remission (HCC)   Neuropathy, diabetic (HCC)   OSA (obstructive sleep apnea)   Hydronephrosis   HLD (hyperlipidemia)   T2DM (type 2 diabetes mellitus) (HCC)    Discharge Instructions  Discharge Instructions     Diet Carb Modified   Complete by: As directed    Discharge instructions   Complete by: As directed    1)Please take your medications as instructed 2)You will be called by urology for follow-up appointment.   Increase activity slowly   Complete by: As directed       Allergies as of 06/30/2023       Reactions   Codeine Other (See Comments)   Makes her head feel "weird"   Glucophage [metformin Hcl] Diarrhea   Nsaids Other (See Comments)   "upset stomach"        Medication List     STOP taking these medications    amoxicillin 500 MG capsule Commonly known as: AMOXIL   cephALEXin 500 MG capsule Commonly known as: KEFLEX       TAKE these medications    atorvastatin 40 MG tablet Commonly known as: Lipitor Take 1 tablet (40 mg total) by mouth daily. What changed: when to take this   blood glucose meter kit and supplies Check sugar 3 times a day. [id number] based insurance preference.   buPROPion 300 MG  24 hr tablet Commonly known as: WELLBUTRIN XL Take 300 mg by mouth daily.   clonazePAM 1 MG tablet Commonly known as: KLONOPIN Take 1 mg by mouth as needed for anxiety.   diclofenac sodium 1 % Gel Commonly known as: Voltaren Apply  2 g topically 4 (four) times daily. What changed:  when to take this reasons to take this   Droplet Pen Needles 31G X 8 MM Misc Generic drug: Insulin Pen Needle USE AS DIRECTED  WITH  LANTUS  INJECTIONS   ferrous sulfate 325 (65 FE) MG tablet Take 325 mg by mouth daily with breakfast. M/ W/ F   glucose blood test strip One touch Test blood sugar TID PRN DX insulin dependent diabetic E11.9   Lantus SoloStar 100 UNIT/ML Solostar Pen Generic drug: insulin glargine INJECT 8 UNITS SUBCUTANEOUSLY EVERY DAY AS DIRECTED OR AS DIRECTED BY PRIMARY CARE PHYSICIAN DISCARD PEN 28 DAYS AFTER OPENING What changed: See the new instructions.   losartan 100 MG tablet Commonly known as: COZAAR TAKE 1/2 TO 1 TABLET DAILY AS DIRECTED FOR BLOOD PRESSURE AND KIDNEY PROTECTION What changed: See the new instructions.   metFORMIN 500 MG 24 hr tablet Commonly known as: GLUCOPHAGE-XR Take 1,000 mg by mouth 2 (two) times daily.   mirtazapine 15 MG tablet Commonly known as: REMERON Take 7.5 mg by mouth at bedtime.   ondansetron 4 MG disintegrating tablet Commonly known as: ZOFRAN-ODT Take 1 tablet (4 mg total) by mouth every 8 (eight) hours as needed for nausea.   oxybutynin 5 MG tablet Commonly known as: DITROPAN Take 1 tablet (5 mg total) by mouth every 8 (eight) hours as needed for bladder spasms.   oxyCODONE-acetaminophen 5-325 MG tablet Commonly known as: Percocet Take 1 tablet by mouth every 4 (four) hours as needed. What changed: Another medication with the same name was removed. Continue taking this medication, and follow the directions you see here.   pantoprazole 40 MG tablet Commonly known as: PROTONIX Take 40 mg by mouth daily.   tamsulosin 0.4 MG Caps capsule Commonly known as: FLOMAX Take 1 capsule (0.4 mg total) by mouth daily after supper.   venlafaxine XR 150 MG 24 hr capsule Commonly known as: EFFEXOR-XR Take 150 mg by mouth daily with breakfast.   Vitamin D 50 MCG (2000  UT) tablet Take 2,000 Units by mouth daily with breakfast.   zolpidem 10 MG tablet Commonly known as: AMBIEN Take 10 mg by mouth at bedtime.        Allergies  Allergen Reactions   Codeine Other (See Comments)    Makes her head feel "weird"   Glucophage [Metformin Hcl] Diarrhea   Nsaids Other (See Comments)    "upset stomach"    Consultations: Urology   Procedures/Studies: DG C-Arm 1-60 Min-No Report Result Date: 06/29/2023 Fluoroscopy was utilized by the requesting physician.  No radiographic interpretation.   US RENAL Result Date: 06/28/2023 CLINICAL DATA:  Right flank pain. EXAM: RENAL / URINARY TRACT ULTRASOUND COMPLETE COMPARISON:  None Available. FINDINGS: Right Kidney: Renal measurements: 10.3 cm x 4.8 cm x 5.8 cm = volume: 148.7 mL. Echogenicity within normal limits. No mass is visualized. There is mild right-sided hydronephrosis. Left Kidney: Renal measurements: 9.2 cm x 4.7 cm x 5.5 cm = volume: 124.5 mL. Echogenicity within normal limits. No mass or hydronephrosis visualized. Bladder: The urinary bladder is empty and subsequently limited in evaluation. Other: None. IMPRESSION: Mild right-sided hydronephrosis. Electronically Signed   By: Aram Candela M.D.   On:  06/28/2023 22:21   CT ABDOMEN PELVIS W CONTRAST Result Date: 06/27/2023 CLINICAL DATA:  Right lower quadrant abdominal pain. EXAM: CT ABDOMEN AND PELVIS WITH CONTRAST TECHNIQUE: Multidetector CT imaging of the abdomen and pelvis was performed using the standard protocol following bolus administration of intravenous contrast. RADIATION DOSE REDUCTION: This exam was performed according to the departmental dose-optimization program which includes automated exposure control, adjustment of the mA and/or kV according to patient size and/or use of iterative reconstruction technique. CONTRAST:  75mL OMNIPAQUE IOHEXOL 300 MG/ML  SOLN COMPARISON:  CT abdomen pelvis dated 05/30/2023. FINDINGS: Lower chest: The visualized lung  bases are clear. No intra-abdominal free air or free fluid. Hepatobiliary: The liver is unremarkable. No biliary dilatation. Cholecystectomy. Pancreas: Unremarkable. No pancreatic ductal dilatation or surrounding inflammatory changes. Spleen: Normal in size without focal abnormality. Adrenals/Urinary Tract: The adrenal glands are unremarkable. The 4 mm stone seen in the inferior pole of the right kidney on prior CT is not located in the proximal right ureter. There is mild right hydronephrosis. A 1 cm stone in the upper pole of the left kidney on the prior CT is not located in the left renal pelvis. There is mild left pelviectasis without hydronephrosis. The urinary bladder is collapsed. Stomach/Bowel: There is no bowel obstruction or active inflammation. The appendix is normal. Vascular/Lymphatic: The abdominal aorta and IVC are unremarkable. No portal venous gas. There is no adenopathy. Reproductive: The uterus is grossly unremarkable. No suspicious adnexal masses. Other: None Musculoskeletal: Degenerative changes of spine. No acute osseous pathology. IMPRESSION: 1. A 4 mm proximal right ureteral calculus with mild right hydronephrosis. 2. A 1 cm stone in the left renal pelvis. No hydronephrosis on the left. 3. No bowel obstruction. Normal appendix. Electronically Signed   By: Elgie Collard M.D.   On: 06/27/2023 20:17      Subjective: Patient seen and examined at bedside today.  Hemodynamically stable.  No abdominal pain, nausea or vomiting.  Eager  to go home.  Medically stable for discharge  Discharge Exam: Vitals:   06/29/23 2323 06/30/23 0415  BP: (!) 106/47 133/69  Pulse: 69 75  Resp: 18 20  Temp: 98.3 F (36.8 C) 98.6 F (37 C)  SpO2: 98% 96%   Vitals:   06/29/23 1407 06/29/23 1829 06/29/23 2323 06/30/23 0415  BP: 128/61 131/73 (!) 106/47 133/69  Pulse: 79 81 69 75  Resp: 16 16 18 20   Temp: 98.6 F (37 C) 98.6 F (37 C) 98.3 F (36.8 C) 98.6 F (37 C)  TempSrc:   Oral   SpO2:  98% 95% 98% 96%  Weight:      Height:        General: Pt is alert, awake, not in acute distress Cardiovascular: RRR, S1/S2 +, no rubs, no gallops Respiratory: CTA bilaterally, no wheezing, no rhonchi Abdominal: Soft, NT, ND, bowel sounds + Extremities: no edema, no cyanosis    The results of significant diagnostics from this hospitalization (including imaging, microbiology, ancillary and laboratory) are listed below for reference.     Microbiology: Recent Results (from the past 240 hours)  Urine Culture (for pregnant, neutropenic or urologic patients or patients with an indwelling urinary catheter)     Status: Abnormal (Preliminary result)   Collection Time: 06/27/23  6:12 PM   Specimen: Urine, Clean Catch  Result Value Ref Range Status   Specimen Description   Final    URINE, CLEAN CATCH Performed at [facility], [address] [place], [place] [address]  Special Requests   Final    NONE Performed at [facility], [address], [place], [place] [address]    Culture (A)  Final    20,000 COLONIES/mL ENTEROCOCCUS FAECALIS SUSCEPTIBILITIES TO FOLLOW Performed at [facility], [address] [place], [place] [address]    Report Status PENDING  Incomplete  Culture, blood (Routine X 2) w Reflex to ID Panel     Status: None (Preliminary result)   Collection Time: 06/29/23  9:37 AM   Specimen: BLOOD RIGHT HAND  Result Value Ref Range Status   Specimen Description   Final    BLOOD RIGHT HAND Performed at [facility], [address], [place], [place] [address]    Special Requests   Final    BOTTLES DRAWN AEROBIC AND ANAEROBIC Blood Culture results may not be optimal due to an inadequate volume of blood received in culture bottles Performed at [facility], [address] [place], [place] [address]    Culture   Final    NO GROWTH < 24 HOURS Performed at [facility], [address],  [place], [place] [address]    Report Status PENDING  Incomplete  Culture, blood (Routine X 2) w Reflex to ID Panel     Status: None (Preliminary result)   Collection Time: 06/29/23  9:43 AM   Specimen: BLOOD RIGHT HAND  Result Value Ref Range Status   Specimen Description   Final    BLOOD RIGHT HAND Performed at [facility], [address] [place], [place] [address]    Special Requests   Final    BOTTLES DRAWN AEROBIC AND ANAEROBIC Blood Culture results may not be optimal due to an inadequate volume of blood received in culture bottles Performed at [facility], [address], [place], [place] [address]    Culture   Final    NO GROWTH < 24 HOURS Performed at [facility], [address], [place], [place] [address]    Report Status PENDING  Incomplete     Labs: BNP (last 3 results) No results for input(s): "BNP" in the last 8760 hours. Basic Metabolic Panel: Recent Labs  Lab 06/27/23 1812 06/28/23 2018 06/29/23 0450 06/30/23 0553  NA 136 136 137 137  K 4.2 4.5 4.1 4.0  CL 106 105 107 109  CO2 19* 20* 22 20*  GLUCOSE 195* 289* 130* 126*  BUN 19 24* 22 16  CREATININE 1.16* 1.81* 1.81* 1.35*  CALCIUM 9.2 9.0 8.8* 7.7*   Liver Function Tests: Recent Labs  Lab 06/27/23 1812 06/28/23 2018  AST 29 24  ALT 24 20  ALKPHOS 75 77  BILITOT 0.6 0.7  PROT 6.6 7.0  ALBUMIN 3.4* 3.6   Recent Labs  Lab 06/27/23 1812 06/28/23 2018  LIPASE 36 34   No results for input(s): "AMMONIA" in the last 168 hours. CBC: Recent Labs  Lab 06/27/23 1812 06/28/23 2018 06/29/23 0450 06/30/23 0553  WBC 12.4* 15.2* 14.8* 10.5  NEUTROABS 8.7* 10.9* 10.3*  --   HGB 10.4* 10.7* 10.3* 8.6*  HCT 33.5* 33.8* 32.2* 27.3*  MCV 95.2 94.7 94.2 95.5  PLT 371 410* 359 278   Cardiac Enzymes: No results for input(s): "CKTOTAL", "CKMB", "CKMBINDEX", "TROPONINI" in the last 168 hours. BNP: Invalid input(s): "POCBNP" CBG: Recent Labs  Lab 06/29/23 1022  06/29/23 1318 06/29/23 1736 06/29/23 2036 06/30/23 0805  GLUCAP 152* 135* 241* 200* 158*   D-Dimer  No results for input(s): "DDIMER" in the last 72 hours. Hgb A1c Recent Labs    06/28/23 2018  HGBA1C 7.1*   Lipid Profile No results for input(s): "CHOL", "HDL", "LDLCALC", "TRIG", "CHOLHDL", "LDLDIRECT" in the last 72 hours. Thyroid function studies No results for input(s): "TSH", "T4TOTAL", "T3FREE", "THYROIDAB" in the last 72 hours.  Invalid input(s): "FREET3" Anemia work up No results for input(s): "VITAMINB12", "FOLATE", "FERRITIN", "TIBC", "IRON", "RETICCTPCT" in the last 72 hours. Urinalysis    Component Value Date/Time   COLORURINE YELLOW 06/28/2023 2134   APPEARANCEUR HAZY (A) 06/28/2023 2134   LABSPEC 1.042 (H) 06/28/2023 2134   PHURINE 5.0 06/28/2023 2134   GLUCOSEU 50 (A) 06/28/2023 2134   HGBUR LARGE (A) 06/28/2023 2134   BILIRUBINUR NEGATIVE 06/28/2023 2134   KETONESUR NEGATIVE 06/28/2023 2134   PROTEINUR 30 (A) 06/28/2023 2134   UROBILINOGEN 0.2 08/21/2011 1447   NITRITE NEGATIVE 06/28/2023 2134   LEUKOCYTESUR NEGATIVE 06/28/2023 2134   Sepsis Labs Recent Labs  Lab 06/27/23 1812 06/28/23 2018 06/29/23 0450 06/30/23 0553  WBC 12.4* 15.2* 14.8* 10.5   Microbiology Recent Results (from the past 240 hours)  Urine Culture (for pregnant, neutropenic or urologic patients or patients with an indwelling urinary catheter)     Status: Abnormal (Preliminary result)   Collection Time: 06/27/23  6:12 PM   Specimen: Urine, Clean Catch  Result Value Ref Range Status   Specimen Description   Final    URINE, CLEAN CATCH Performed at [facility], [address] [place], [place] [address]    Special Requests   Final    NONE Performed at [facility], [address] [place], [place] [address]    Culture (A)  Final    20,000 COLONIES/mL ENTEROCOCCUS FAECALIS SUSCEPTIBILITIES TO FOLLOW Performed at [facility],  [address], [place], [place] [address]    Report Status PENDING  Incomplete  Culture, blood (Routine X 2) w Reflex to ID Panel     Status: None (Preliminary result)   Collection Time: 06/29/23  9:37 AM   Specimen: BLOOD RIGHT HAND  Result Value Ref Range Status   Specimen Description   Final    BLOOD RIGHT HAND Performed at [facility], [address] [place], [place] [address]    Special Requests   Final    BOTTLES DRAWN AEROBIC AND ANAEROBIC Blood Culture results may not be optimal due to an inadequate volume of blood received in culture bottles Performed at [facility], [address] [place], [place] [address]    Culture   Final    NO GROWTH < 24 HOURS Performed at [facility], [address] [place], [place] [address]    Report Status PENDING  Incomplete  Culture, blood (Routine X 2) w Reflex to ID Panel     Status: None (Preliminary result)   Collection Time: 06/29/23  9:43 AM   Specimen: BLOOD RIGHT HAND  Result Value Ref Range Status   Specimen Description   Final    BLOOD RIGHT HAND Performed at [facility], [address], [place], [place] [address]    Special Requests   Final    BOTTLES DRAWN AEROBIC AND ANAEROBIC Blood Culture results may not be optimal due to an inadequate volume of blood received in culture bottles Performed at [facility], [address], [place], [place] [address]    Culture   Final    NO  GROWTH < 24 HOURS Performed at [facility], [address] [place], [place] [address]    Report Status PENDING  Incomplete    Please note: You were cared for by a hospitalist during your hospital stay. Once you are discharged, your primary care physician will handle any further medical issues. Please note that NO REFILLS for any discharge medications will be authorized once you are discharged, as it is imperative that you return to your primary care physician (or establish a relationship  with a primary care physician if you do not have one) for your post hospital discharge needs so that they can reassess your need for medications and monitor your lab values.    Time coordinating discharge: 40 minutes  SIGNED:   Burnadette Pop, MD  [facility] 06/30/2023, 10:08 AM Pager [phone]  If 7PM-7AM, please contact night-coverage [email or web address] Password [email or web address]

## 2023-07-01 LAB — URINE CULTURE: Culture: 20000 — AB

## 2023-07-03 DIAGNOSIS — N200 Calculus of kidney: Secondary | ICD-10-CM | POA: Diagnosis not present

## 2023-07-04 LAB — CULTURE, BLOOD (ROUTINE X 2)
Culture: NO GROWTH
Culture: NO GROWTH

## 2023-07-13 ENCOUNTER — Emergency Department (HOSPITAL_COMMUNITY): Payer: Medicare Other

## 2023-07-13 ENCOUNTER — Emergency Department (HOSPITAL_COMMUNITY)
Admission: EM | Admit: 2023-07-13 | Discharge: 2023-07-14 | Discharge status: Against Medical Advice | Payer: Medicare Other | Attending: Emergency Medicine | Admitting: Emergency Medicine

## 2023-07-13 DIAGNOSIS — Z9049 Acquired absence of other specified parts of digestive tract: Secondary | ICD-10-CM | POA: Diagnosis not present

## 2023-07-13 DIAGNOSIS — N200 Calculus of kidney: Secondary | ICD-10-CM | POA: Diagnosis not present

## 2023-07-13 DIAGNOSIS — Z5321 Procedure and treatment not carried out due to patient leaving prior to being seen by health care provider: Secondary | ICD-10-CM | POA: Diagnosis not present

## 2023-07-13 DIAGNOSIS — R109 Unspecified abdominal pain: Secondary | ICD-10-CM | POA: Diagnosis not present

## 2023-07-13 DIAGNOSIS — N132 Hydronephrosis with renal and ureteral calculous obstruction: Secondary | ICD-10-CM | POA: Diagnosis not present

## 2023-07-13 LAB — CBC
HCT: 35.2 % — ABNORMAL LOW (ref 36.0–46.0)
Hemoglobin: 11.3 g/dL — ABNORMAL LOW (ref 12.0–15.0)
MCH: 29.8 pg (ref 26.0–34.0)
MCHC: 32.1 g/dL (ref 30.0–36.0)
MCV: 92.9 fL (ref 80.0–100.0)
Platelets: 545 10*3/uL — ABNORMAL HIGH (ref 150–400)
RBC: 3.79 MIL/uL — ABNORMAL LOW (ref 3.87–5.11)
RDW: 13 % (ref 11.5–15.5)
WBC: 15.8 10*3/uL — ABNORMAL HIGH (ref 4.0–10.5)
nRBC: 0 % (ref 0.0–0.2)

## 2023-07-13 LAB — BASIC METABOLIC PANEL
Anion gap: 11 (ref 5–15)
BUN: 17 mg/dL (ref 8–23)
CO2: 18 mmol/L — ABNORMAL LOW (ref 22–32)
Calcium: 9.6 mg/dL (ref 8.9–10.3)
Chloride: 106 mmol/L (ref 98–111)
Creatinine, Ser: 1.25 mg/dL — ABNORMAL HIGH (ref 0.44–1.00)
GFR, Estimated: 47 mL/min — ABNORMAL LOW (ref >60)
Glucose, Bld: 174 mg/dL — ABNORMAL HIGH (ref 70–99)
Potassium: 4.4 mmol/L (ref 3.5–5.1)
Sodium: 135 mmol/L (ref 135–145)

## 2023-07-13 MED ORDER — OXYCODONE-ACETAMINOPHEN 5-325 MG PO TABS
1.0000 | ORAL_TABLET | Freq: Once | ORAL | Status: AC
  Administered 2023-07-13: 1 via ORAL
  Filled 2023-07-13: qty 1

## 2023-07-13 NOTE — ED Triage Notes (Signed)
 Patient had stents put in on each side due to kidney stones diagnosed two weeks ago, had some right sided flank pain over the weekend, pain has since radiated to right lower side, having blood in urine, and rates pain 10 after taking her Oxycodone. Scheduled for surgery this Friday to have stones removed.

## 2023-07-13 NOTE — ED Provider Triage Note (Signed)
 Emergency Medicine Provider Triage Evaluation Note  Mindy Richard , a 68 y.o. female  was evaluated in triage.  Pt complains of kidney stones bilaterally with stents in place, worsening pain on the right lower side, blood in urine, pain 10 after taking home oxycodone, surgery planned for Friday..  Review of Systems  Positive: Flank pain Negative:   Physical Exam  BP (!) 151/79 (BP Location: Left Arm)   Pulse 98   Temp 98.5 F (36.9 C) (Oral)   Resp 18   SpO2 99%  Gen:   Awake, no distress   Resp:  Normal effort  MSK:   Moves extremities without difficulty  Other:  Focal tenderness to palpation in the right flank, right lower quadrant, some guarding  Medical Decision Making  Medically screening exam initiated at 9:53 PM.  Appropriate orders placed.  Mindy Richard was informed that the remainder of the evaluation will be completed by another provider, this initial triage assessment does not replace that evaluation, and the importance of remaining in the ED until their evaluation is complete.  Workup initiated in triage    Olene Floss, [place] 07/13/23 2153

## 2023-07-14 LAB — URINALYSIS, ROUTINE W REFLEX MICROSCOPIC
Bacteria, UA: NONE SEEN
Bilirubin Urine: NEGATIVE
Glucose, UA: 50 mg/dL — AB
Ketones, ur: NEGATIVE mg/dL
Nitrite: NEGATIVE
Protein, ur: 100 mg/dL — AB
RBC / HPF: >50 RBC/hpf (ref 0–5)
Specific Gravity, Urine: 1.016 (ref 1.005–1.030)
WBC, UA: >50 WBC/hpf (ref 0–5)
pH: 5 (ref 5.0–8.0)

## 2023-07-14 NOTE — ED Notes (Signed)
 Patient told registration they will follow up with their PCP in the AM.

## 2023-07-17 DIAGNOSIS — N202 Calculus of kidney with calculus of ureter: Secondary | ICD-10-CM | POA: Diagnosis not present

## 2023-07-18 DIAGNOSIS — E119 Type 2 diabetes mellitus without complications: Secondary | ICD-10-CM | POA: Diagnosis not present

## 2023-07-20 ENCOUNTER — Other Ambulatory Visit: Payer: Self-pay

## 2023-07-20 ENCOUNTER — Ambulatory Visit
Admission: EM | Admit: 2023-07-20 | Discharge: 2023-07-20 | Disposition: A | Discharge status: Home / Self Care | Attending: Family Medicine | Admitting: Family Medicine

## 2023-07-20 ENCOUNTER — Encounter: Payer: Self-pay | Admitting: Emergency Medicine

## 2023-07-20 DIAGNOSIS — J111 Influenza due to unidentified influenza virus with other respiratory manifestations: Secondary | ICD-10-CM

## 2023-07-20 LAB — POC COVID19/FLU A&B COMBO
Covid Antigen, POC: NEGATIVE
Influenza A Antigen, POC: POSITIVE — AB
Influenza B Antigen, POC: NEGATIVE

## 2023-07-20 MED ORDER — OSELTAMIVIR PHOSPHATE 75 MG PO CAPS: 75.0000 mg | ORAL_CAPSULE | Freq: Two times a day (BID) | ORAL | 0 refills | Status: AC

## 2023-07-20 NOTE — ED Triage Notes (Signed)
 Patient c/o sore throat, congestion, cough and aches x Friday. Low grade fever this morning, 99.0.

## 2023-07-20 NOTE — ED Provider Notes (Signed)
 [facility]    CSN: [id number] Arrival date & time: 07/20/23  1425      History   Chief Complaint No chief complaint on file.   HPI Mindy Richard is a 68 y.o. female.   Patient is here for URI symptoms x 4 days.  Having sore throat, cough, congestion, body aches.  Fever up to 99 this morning.  No wheezing or sob.  Husband is sick, but no sick contacts.  Was in the ER last week for kidney stones.  Using tylenol for symptoms.        Past Medical History:  Diagnosis Date   Anxiety    Depression    GERD (gastroesophageal reflux disease)    Hiatal hernia    History of kidney stones    Hypercholesteremia    Hypertension    Insulin-requiring or dependent type II diabetes mellitus (HCC)    followed by pcp  (05-06-2021  pt stated check blood sugar daily fasting in am--- fasting sugar--  140-170)   Left ureteral stone    Mild obstructive sleep apnea    followed by dr Vassie Loll---  (05-06-2021  pt stated has not used cpap >7yrs) study in epic very mild osa with delayed sleep phase syndrome   Wears glasses     Patient Active Problem List   Diagnosis Date Noted   Nephrolithiasis 06/28/2023   Hydronephrosis 06/28/2023   HLD (hyperlipidemia) 06/28/2023   T2DM (type 2 diabetes mellitus) (HCC) 06/28/2023   Delayed sleep phase syndrome 03/26/2020   Encounter for long-term (current) use of insulin (HCC) 08/07/2016   OSA (obstructive sleep apnea) 06/04/2015   Neuropathy, diabetic (HCC) 04/16/2015   Medicare annual wellness visit, initial 01/11/2015   Insulin-requiring or dependent type II diabetes mellitus (HCC) 08/10/2013   Vitamin D deficiency 04/03/2013   Medication management 04/03/2013   Hyperlipidemia associated with type 2 diabetes mellitus (HCC) 04/03/2013   Hypertension 05/09/2011   Depression, major, in remission (HCC) 05/09/2011   GERD 05/09/2011    Past Surgical History:  Procedure Laterality Date   CARPAL TUNNEL RELEASE Bilateral    left 2017;  right  2001   CESAREAN SECTION  1985   CHOLECYSTECTOMY  09/02/2011   Procedure: LAPAROSCOPIC CHOLECYSTECTOMY WITH INTRAOPERATIVE CHOLANGIOGRAM;  Surgeon: Currie Paris, MD;  Location: [facility];  Service: General;  Laterality: N/A;   COLONOSCOPY     CYSTOSCOPY/URETEROSCOPY/HOLMIUM LASER/STENT PLACEMENT Left 05/08/2021   Procedure: LEFT /URETEROSCOPY/HOLMIUM LASER/STENT PLACEMENT;  Surgeon: Despina Arias, MD;  Location: [facility];  Service: Urology;  Laterality: Left;   CYSTOSCOPY/URETEROSCOPY/HOLMIUM LASER/STENT PLACEMENT Bilateral 06/29/2023   Procedure: CYSTOSCOPY/URETEROSCOPY/STENT PLACEMENT;  Surgeon: Crista Elliot, MD;  Location: [facility];  Service: Urology;  Laterality: Bilateral;   DILATION AND CURETTAGE OF UTERUS  1984   ERCP  09/03/2011   Procedure: ENDOSCOPIC RETROGRADE CHOLANGIOPANCREATOGRAPHY (ERCP);  Surgeon: Barrie Folk, MD;  Location: [facility];  Service: Endoscopy;  Laterality: N/A;  sphincterotomy and stone extraction   SHOULDER ARTHROSCOPY WITH SUBACROMIAL DECOMPRESSION Left 10/02/2010   @[facility]  by dr Simonne Come;   w/ closed manipulation   TONSILLECTOMY  1970   TUBAL LIGATION Bilateral 1985    OB History   No obstetric history on file.      Home Medications    Prior to Admission medications   Medication Sig Start Date End Date Taking? Authorizing Provider  atorvastatin (LIPITOR) 40 MG tablet Take 1 tablet (40 mg total) by mouth daily. Patient taking differently: Take 40 mg by  mouth at bedtime. 09/08/17 06/28/23  Quentin Mulling R, PA-C  blood glucose meter kit and supplies Check sugar 3 times a day. DxE11.9Dispense based insurance preference. 09/11/17   Doree Albee, PA-C  buPROPion (WELLBUTRIN XL) 300 MG 24 hr tablet Take 300 mg by mouth daily.  05/21/14   [provider]  Cholecalciferol (VITAMIN D) 2000 units tablet Take 2,000 Units by mouth daily with breakfast.    [provider]  clonazePAM (KLONOPIN) 1 MG tablet Take 1 mg by  mouth as needed for anxiety.    [provider]  diclofenac sodium (VOLTAREN) 1 % GEL Apply 2 g topically 4 (four) times daily. Patient taking differently: Apply 2 g topically 4 (four) times daily as needed (for pain). 02/04/16   Lucky Cowboy, MD  DROPLET PEN NEEDLES 31G X 8 MM MISC USE AS DIRECTED  WITH  LANTUS  INJECTIONS 03/08/18   Lucky Cowboy, MD  ferrous sulfate 325 (65 FE) MG tablet Take 325 mg by mouth daily with breakfast. M/ W/ F    [provider]  glucose blood test strip One touch Test blood sugar TID PRN DX insulin dependent diabetic E11.9 09/09/17   Quentin Mulling R, PA-C  LANTUS SOLOSTAR 100 UNIT/ML Solostar Pen INJECT 8 UNITS SUBCUTANEOUSLY EVERY DAY AS DIRECTED OR AS DIRECTED BY PRIMARY CARE PHYSICIAN DISCARD PEN 28 DAYS AFTER OPENING Patient taking differently: Inject 14 Units into the skin at bedtime. 01/12/18   Lucky Cowboy, MD  metFORMIN (GLUCOPHAGE-XR) 500 MG 24 hr tablet Take 1,000 mg by mouth 2 (two) times daily. 04/28/23   [provider]  mirtazapine (REMERON) 15 MG tablet Take 7.5 mg by mouth at bedtime.    [provider]  ondansetron (ZOFRAN-ODT) 4 MG disintegrating tablet Take 1 tablet (4 mg total) by mouth every 8 (eight) hours as needed for nausea. 05/30/23   Garlon Hatchet, PA-C  oxybutynin (DITROPAN) 5 MG tablet Take 1 tablet (5 mg total) by mouth every 8 (eight) hours as needed for bladder spasms. 06/30/23   Burnadette Pop, MD  oxyCODONE-acetaminophen (PERCOCET) 5-325 MG tablet Take 1 tablet by mouth every 4 (four) hours as needed. 05/30/23   Garlon Hatchet, PA-C  pantoprazole (PROTONIX) 40 MG tablet Take 40 mg by mouth daily.    [provider]  tamsulosin (FLOMAX) 0.4 MG CAPS capsule Take 1 capsule (0.4 mg total) by mouth daily after supper. 06/30/23   Burnadette Pop, MD  venlafaxine XR (EFFEXOR-XR) 150 MG 24 hr capsule Take 150 mg by mouth daily with breakfast.  05/21/14   [provider]  zolpidem  (AMBIEN) 10 MG tablet Take 10 mg by mouth at bedtime.  05/06/11   [provider]    Family History Family History  Problem Relation Age of Onset   Cancer Maternal Aunt        breast   Heart attack Father 28       Smoker   Diabetes Mother    Hyperlipidemia Mother    Anesthesia problems Neg Hx    Hypotension Neg Hx     Social History Social History   Tobacco Use   Smoking status: Never   Smokeless tobacco: Never  Vaping Use   Vaping status: Never Used  Substance Use Topics   Alcohol use: No   Drug use: Never     Allergies   Codeine, Glucophage [metformin hcl], and Nsaids   Review of Systems Review of Systems  Constitutional:  Positive for chills, fatigue and  fever.  HENT:  Positive for congestion, rhinorrhea and sore throat.   Respiratory:  Positive for cough.   Cardiovascular: Negative.   Gastrointestinal: Negative.   Genitourinary: Negative.   Musculoskeletal: Negative.   Psychiatric/Behavioral: Negative.       Physical Exam Triage Vital Signs ED Triage Vitals  Encounter Vitals Group     BP 07/20/23 1441 115/77     Systolic BP Percentile --      Diastolic BP Percentile --      Pulse Rate 07/20/23 1441 91     Resp 07/20/23 1441 15     Temp 07/20/23 1441 97.9 F (36.6 C)     Temp Source 07/20/23 1441 Oral     SpO2 07/20/23 1441 96 %     Weight --      Height --      Head Circumference --      Peak Flow --      Pain Score 07/20/23 1440 8     Pain Loc --      Pain Education --      Exclude from Growth Chart --    No data found.  Updated Vital Signs BP 115/77 (BP Location: Right Arm)   Pulse 91   Temp 97.9 F (36.6 C) (Oral)   Resp 15   SpO2 96%   Visual Acuity Right Eye Distance:   Left Eye Distance:   Bilateral Distance:    Right Eye Near:   Left Eye Near:    Bilateral Near:     Physical Exam Constitutional:      General: She is not in acute distress.    Appearance: Normal appearance. She is normal weight. She is  ill-appearing. She is not toxic-appearing.  HENT:     Nose: Congestion present. No rhinorrhea.     Mouth/Throat:     Mouth: Mucous membranes are moist.     Pharynx: Posterior oropharyngeal erythema present. No oropharyngeal exudate.  Cardiovascular:     Rate and Rhythm: Normal rate and regular rhythm.  Pulmonary:     Effort: Pulmonary effort is normal.     Breath sounds: Normal breath sounds. No wheezing or rhonchi.  Musculoskeletal:     Cervical back: Normal range of motion and neck supple. No rigidity or tenderness.  Lymphadenopathy:     Cervical: No cervical adenopathy.  Skin:    General: Skin is warm.  Neurological:     General: No focal deficit present.     Mental Status: She is alert.  Psychiatric:        Mood and Affect: Mood normal.      UC Treatments / Results  Labs (all labs ordered are listed, but only abnormal results are displayed) Labs Reviewed  POC COVID19/FLU A&B COMBO - Abnormal; Notable for the following components:      Result Value   Influenza A Antigen, POC Positive (*)    All other components within normal limits    EKG   Radiology No results found.  Procedures Procedures (including critical care time)  Medications Ordered in UC Medications - No data to display  Initial Impression / Assessment and Plan / UC Course  I have reviewed the triage vital signs and the nursing notes.  Pertinent labs & imaging results that were available during my care of the patient were reviewed by me and considered in my medical decision making (see chart for details).   Final Clinical Impressions(s) / UC Diagnoses   Final diagnoses:  Influenza with respiratory  manifestation     Discharge Instructions      You were diagnosed with influenza today.  I have sent out tamiflu to help with symptoms.  Please start this today as you are already on day 4 of symptoms.  You may use tylenol for body aches/fever and sore throat.  You may use salt water gargles as  well.  You may use over the counter medications as needed for sinus congestion and cough.     ED Prescriptions     Medication Sig Dispense Auth. Provider   oseltamivir (TAMIFLU) 75 MG capsule Take 1 capsule (75 mg total) by mouth every 12 (twelve) hours. 10 capsule Jannifer Franklin, MD      PDMP not reviewed this encounter.   Jannifer Franklin, MD 07/20/23 [phone]

## 2023-07-20 NOTE — Discharge Instructions (Signed)
 You were diagnosed with influenza today.  I have sent out tamiflu to help with symptoms.  Please start this today as you are already on day 4 of symptoms.  You may use tylenol for body aches/fever and sore throat.  You may use salt water gargles as well.  You may use over the counter medications as needed for sinus congestion and cough.

## 2023-07-23 DIAGNOSIS — N202 Calculus of kidney with calculus of ureter: Secondary | ICD-10-CM | POA: Diagnosis not present

## 2023-07-29 DIAGNOSIS — H524 Presbyopia: Secondary | ICD-10-CM | POA: Diagnosis not present

## 2023-08-13 DIAGNOSIS — E538 Deficiency of other specified B group vitamins: Secondary | ICD-10-CM | POA: Diagnosis not present

## 2023-08-13 DIAGNOSIS — D649 Anemia, unspecified: Secondary | ICD-10-CM | POA: Diagnosis not present

## 2023-08-13 DIAGNOSIS — N1831 Chronic kidney disease, stage 3a: Secondary | ICD-10-CM | POA: Diagnosis not present

## 2023-08-13 DIAGNOSIS — E785 Hyperlipidemia, unspecified: Secondary | ICD-10-CM | POA: Diagnosis not present

## 2023-08-13 DIAGNOSIS — E118 Type 2 diabetes mellitus with unspecified complications: Secondary | ICD-10-CM | POA: Diagnosis not present

## 2023-08-13 DIAGNOSIS — D473 Essential (hemorrhagic) thrombocythemia: Secondary | ICD-10-CM | POA: Diagnosis not present

## 2023-08-18 DIAGNOSIS — E119 Type 2 diabetes mellitus without complications: Secondary | ICD-10-CM | POA: Diagnosis not present

## 2023-08-19 DIAGNOSIS — N202 Calculus of kidney with calculus of ureter: Secondary | ICD-10-CM | POA: Diagnosis not present

## 2023-08-24 ENCOUNTER — Ambulatory Visit
Admission: RE | Admit: 2023-08-24 | Discharge: 2023-08-24 | Disposition: A | Discharge status: Home / Self Care | Source: Ambulatory Visit | Attending: Adult Health

## 2023-08-24 ENCOUNTER — Other Ambulatory Visit: Payer: Self-pay | Admitting: Adult Health

## 2023-08-24 DIAGNOSIS — R053 Chronic cough: Secondary | ICD-10-CM

## 2023-09-17 DIAGNOSIS — E119 Type 2 diabetes mellitus without complications: Secondary | ICD-10-CM | POA: Diagnosis not present

## 2023-10-06 DIAGNOSIS — K080 Exfoliation of teeth due to systemic causes: Secondary | ICD-10-CM | POA: Diagnosis not present

## 2023-10-07 NOTE — Therapy (Incomplete)
 OUTPATIENT PHYSICAL THERAPY VESTIBULAR EVALUATION     Patient Name: Mindy Richard MRN: [id number] DOB:[birth year], 68 y.o., female Today's Date: 10/07/2023  END OF SESSION:   Past Medical History:  Diagnosis Date   Anxiety    Depression    GERD (gastroesophageal reflux disease)    Hiatal hernia    History of kidney stones    Hypercholesteremia    Hypertension    Insulin -requiring or dependent type II diabetes mellitus (HCC)    followed by pcp  (05-06-2021  pt stated check blood sugar daily fasting in am--- fasting sugar--  140-170)   Left ureteral stone    Mild obstructive sleep apnea    followed by dr Villa Greaser---  (05-06-2021  pt stated has not used cpap >8yrs) study in epic very mild osa with delayed sleep phase syndrome   Wears glasses    Past Surgical History:  Procedure Laterality Date   CARPAL TUNNEL RELEASE Bilateral    left 2017;  right 2001   CESAREAN SECTION  1985   CHOLECYSTECTOMY  09/02/2011   Procedure: LAPAROSCOPIC CHOLECYSTECTOMY WITH INTRAOPERATIVE CHOLANGIOGRAM;  Surgeon: Darcella Earnest, MD;  Location: [facility];  Service: General;  Laterality: N/A;   COLONOSCOPY     CYSTOSCOPY/URETEROSCOPY/HOLMIUM LASER/STENT PLACEMENT Left 05/08/2021   Procedure: LEFT /URETEROSCOPY/HOLMIUM LASER/STENT PLACEMENT;  Surgeon: Mallie Seal, MD;  Location: [facility] [place];  Service: Urology;  Laterality: Left;   CYSTOSCOPY/URETEROSCOPY/HOLMIUM LASER/STENT PLACEMENT Bilateral 06/29/2023   Procedure: CYSTOSCOPY/URETEROSCOPY/STENT PLACEMENT;  Surgeon: Samson Croak, MD;  Location: [facility];  Service: Urology;  Laterality: Bilateral;   DILATION AND CURETTAGE OF UTERUS  1984   ERCP  09/03/2011   Procedure: ENDOSCOPIC RETROGRADE CHOLANGIOPANCREATOGRAPHY (ERCP);  Surgeon: Barbie Boon, MD;  Location: [facility]COPY;  Service: Endoscopy;  Laterality: N/A;  sphincterotomy and stone extraction   SHOULDER ARTHROSCOPY WITH SUBACROMIAL DECOMPRESSION Left 10/02/2010   @[facility]  by dr  Mevelyn Acton;   w/ closed manipulation   TONSILLECTOMY  1970   TUBAL LIGATION Bilateral 1985   Patient Active Problem List   Diagnosis Date Noted   Nephrolithiasis 06/28/2023   Hydronephrosis 06/28/2023   HLD (hyperlipidemia) 06/28/2023   T2DM (type 2 diabetes mellitus) (HCC) 06/28/2023   Delayed sleep phase syndrome 03/26/2020   Encounter for long-term (current) use of insulin  (HCC) 08/07/2016   OSA (obstructive sleep apnea) 06/04/2015   Neuropathy, diabetic (HCC) 04/16/2015   Medicare annual wellness visit, initial 01/11/2015   Insulin -requiring or dependent type II diabetes mellitus (HCC) 08/10/2013   Vitamin D  deficiency 04/03/2013   Medication management 04/03/2013   Hyperlipidemia associated with type 2 diabetes mellitus (HCC) 04/03/2013   Hypertension 05/09/2011   Depression, major, in remission (HCC) 05/09/2011   GERD 05/09/2011    PCP: Augustus Blood, DO  REFERRING PROVIDER: Winona Bureau, NP   REFERRING DIAG: R42 (ICD-10-CM) - Dizziness and giddiness  THERAPY DIAG:  No diagnosis found.  ONSET DATE: ***  Rationale for Evaluation and Treatment: Rehabilitation  SUBJECTIVE:   SUBJECTIVE STATEMENT: *** Pt accompanied by: {accompnied:27141}  PERTINENT HISTORY: Anxiety, depression, hiatal hernia, HLD, HTN, DMII, L shoulder scope  PAIN:  Are you having pain? {OPRCPAIN:27236}  PRECAUTIONS: {Therapy precautions:24002}  RED FLAGS: {PT Red Flags:29287}   WEIGHT BEARING RESTRICTIONS: {Yes ***/No:24003}  FALLS: Has patient fallen in last 6 months? {fallsyesno:27318}  LIVING ENVIRONMENT: Lives with: {OPRC lives with:25569::"lives with their family"} Lives in: {Lives in:25570} Stairs: {opstairs:27293} Has following equipment at home: {Assistive devices:23999}  PLOF: {PLOF:24004}  PATIENT GOALS: ***  OBJECTIVE:  Note: Objective measures were completed at Evaluation unless otherwise noted.  DIAGNOSTIC FINDINGS: none recent  COGNITION: Overall  cognitive status: {cognition:24006}   SENSATION: {sensation:27233}  POSTURE:  {posture:25561}  GAIT: Gait pattern: {gait characteristics:25376} Distance walked: *** Assistive device utilized: {Assistive devices:23999} Level of assistance: {Levels of assistance:24026} Comments: ***  FUNCTIONAL TESTS:  {Functional tests:24029}  PATIENT SURVEYS:  DHI ***  VESTIBULAR ASSESSMENT:   GENERAL OBSERVATION: ***   OCULOMOTOR EXAM: {Ocular Alignment:32714:s}   Ocular ROM: {RANGE OF MOTION:21649}   Spontaneous Nystagmus: {Spontaneous nystagmus:25263}   Gaze-Induced Nystagmus: {gaze-induced nystagmus:25264}   Smooth Pursuits: {smooth pursuit:25265}   Saccades: {saccades:25266}   Convergence/Divergence: *** cm    VESTIBULAR - OCULAR REFLEX:    Slow VOR: {slow VOR:25290}   VOR Cancellation: {vor cancellation:25291}   Head-Impulse Test: {head impulse test:25272}   Dynamic Visual Acuity: {dynamic visual acuity:25273}  AUDITORY SCREEN:  Rinne Test {DESC;Negative/Positive:115700014}    POSITIONAL TESTING:  Right Roll Test: *** Left Roll Test: ***  Right Loaded Dix-Hallpike: *** Left Loaded Dix-Hallpike: ***  Right Sidelying: *** Left Sidelying: ***     MOTION SENSITIVITY:    Motion Sensitivity Quotient  Intensity: 0 = none, 1 = Lightheaded, 2 = Mild, 3 = Moderate, 4 = Severe, 5 = Vomiting  Intensity  1. Sitting to supine   2. Supine to L side   3. Supine to R side   4. Supine to sitting   5. L Hallpike-Dix   6. Up from L    7. R Hallpike-Dix   8. Up from R    9. Sitting, head  tipped to L knee   10. Head up from L  knee   11. Sitting, head  tipped to R knee   12. Head up from R  knee   13. Sitting head turns x5   14.Sitting head nods x5   15. In stance, 180  turn to L    16. In stance, 180  turn to R                                                                                                                                TREATMENT DATE:  ***   Canalith Repositioning:  {Canalith Repositioning:25283} Gaze Adaptation:  {gaze adaptation:25286} Habituation:  {habituation:25288} Other: ***  PATIENT EDUCATION: Education details: *** Person educated: {Person educated:25204} Education method: {Education Method:25205} Education comprehension: {Education Comprehension:25206}  HOME EXERCISE PROGRAM:   GOALS: Goals reviewed with patient? Yes  SHORT TERM GOALS: Target date: {follow up:25551}  Patient to be independent with initial HEP. Baseline: HEP initiated Goal status: {GOALSTATUS:25110}    LONG TERM GOALS: Target date: {follow up:25551}  Patient to be independent with advanced HEP. Baseline: Not yet initiated  Goal status: {GOALSTATUS:25110}  Patient to report 0/10 dizziness with standing vertical and horizontal VOR for 30 seconds. Baseline: Unable Goal status: {GOALSTATUS:25110}  Patient will report 0/10 dizziness with bed mobility.  Baseline: Symptomatic  Goal status: {  GOALSTATUS:25110}  Patient to demonstrate *** sway with M-CTSIB condition with eyes closed/foam surface in order to improve safety in environments with uneven surfaces and dim lighting. Baseline: *** Goal status: {GOALSTATUS:25110}  Patient to score at least 20/24 on DGI in order to decrease risk of falls. Baseline: *** Goal status: {GOALSTATUS:25110}  Patient will ambulate over outdoor surfaces with LRAD while performing head turns to scan environment with good stability in order to indicate safe community mobility. Baseline: Unable Goal status: {GOALSTATUS:25110}  Patient to score at least 18 points less on DHI in order to meet MCID and improve functional outcomes.  Baseline: *** Goal status: {GOALSTATUS:25110}    ASSESSMENT:  CLINICAL IMPRESSION:   Patient is a 68 y/o F presenting to [facility] with c/o dizziness for the past ***.   Denies head trauma, infection/illness, vision changes/double vision, hearing loss, tinnitus,  otalgia, photo/phonophobia.  Oculomotor exam revealed ***.  Positional testing was ***.   Patient was educated on gentle *** HEP and reported understanding. Would benefit from skilled PT services ***x/week for *** weeks to address aforementioned impairments in order to optimize level of function.    OBJECTIVE IMPAIRMENTS: {opptimpairments:25111}.   ACTIVITY LIMITATIONS: {activitylimitations:27494}  PARTICIPATION LIMITATIONS: {participationrestrictions:25113}  PERSONAL FACTORS: {Personal factors:25162} are also affecting patient's functional outcome.   REHAB POTENTIAL: {rehabpotential:25112}  CLINICAL DECISION MAKING: {clinical decision making:25114}  EVALUATION COMPLEXITY: {Evaluation complexity:25115}   PLAN:  PT FREQUENCY: {rehab frequency:25116}  PT DURATION: {rehab duration:25117}  PLANNED INTERVENTIONS: {rehab planned interventions:25118::"97110-Therapeutic exercises","97530- Therapeutic [phone]- Neuromuscular re-education","97535- Self XBMW","41324- Manual therapy"}  PLAN FOR NEXT SESSION: Arlene Lacy, PT 10/07/2023, 9:24 AM

## 2023-10-08 ENCOUNTER — Ambulatory Visit: Admitting: Physical Therapy

## 2023-10-18 DIAGNOSIS — E119 Type 2 diabetes mellitus without complications: Secondary | ICD-10-CM | POA: Diagnosis not present

## 2023-10-20 DIAGNOSIS — E559 Vitamin D deficiency, unspecified: Secondary | ICD-10-CM | POA: Diagnosis not present

## 2023-10-20 DIAGNOSIS — I10 Essential (primary) hypertension: Secondary | ICD-10-CM | POA: Diagnosis not present

## 2023-10-20 DIAGNOSIS — F5101 Primary insomnia: Secondary | ICD-10-CM | POA: Diagnosis not present

## 2023-10-20 DIAGNOSIS — K219 Gastro-esophageal reflux disease without esophagitis: Secondary | ICD-10-CM | POA: Diagnosis not present

## 2023-10-20 NOTE — Therapy (Incomplete)
 OUTPATIENT PHYSICAL THERAPY VESTIBULAR EVALUATION     Patient Name: Mindy Richard MRN: [id number] DOB:December 13, 1955, 68 y.o., female Today's Date: 10/20/2023  END OF SESSION:   Past Medical History:  Diagnosis Date   Anxiety    Depression    GERD (gastroesophageal reflux disease)    Hiatal hernia    History of kidney stones    Hypercholesteremia    Hypertension    Insulin -requiring or dependent type II diabetes mellitus (HCC)    followed by pcp  (05-06-2021  pt stated check blood sugar daily fasting in am--- fasting sugar--  140-170)   Left ureteral stone    Mild obstructive sleep apnea    followed by dr Villa Greaser---  (05-06-2021  pt stated has not used cpap >56yrs) study in epic very mild osa with delayed sleep phase syndrome   Wears glasses    Past Surgical History:  Procedure Laterality Date   CARPAL TUNNEL RELEASE Bilateral    left 2017;  right 2001   CESAREAN SECTION  1985   CHOLECYSTECTOMY  09/02/2011   Procedure: LAPAROSCOPIC CHOLECYSTECTOMY WITH INTRAOPERATIVE CHOLANGIOGRAM;  Surgeon: Darcella Earnest, MD;  Location: [facility];  Service: General;  Laterality: N/A;   COLONOSCOPY     CYSTOSCOPY/URETEROSCOPY/HOLMIUM LASER/STENT PLACEMENT Left 05/08/2021   Procedure: LEFT /URETEROSCOPY/HOLMIUM LASER/STENT PLACEMENT;  Surgeon: Mallie Seal, MD;  Location: [facility] [place];  Service: Urology;  Laterality: Left;   CYSTOSCOPY/URETEROSCOPY/HOLMIUM LASER/STENT PLACEMENT Bilateral 06/29/2023   Procedure: CYSTOSCOPY/URETEROSCOPY/STENT PLACEMENT;  Surgeon: Samson Croak, MD;  Location: [facility];  Service: Urology;  Laterality: Bilateral;   DILATION AND CURETTAGE OF UTERUS  1984   ERCP  09/03/2011   Procedure: ENDOSCOPIC RETROGRADE CHOLANGIOPANCREATOGRAPHY (ERCP);  Surgeon: Barbie Boon, MD;  Location: [facility] ENDOSCOPY;  Service: Endoscopy;  Laterality: N/A;  sphincterotomy and stone extraction   SHOULDER ARTHROSCOPY WITH SUBACROMIAL DECOMPRESSION Left 10/02/2010   @[facility]  by dr  Mevelyn Acton;   w/ closed manipulation   TONSILLECTOMY  1970   TUBAL LIGATION Bilateral 1985   Patient Active Problem List   Diagnosis Date Noted   Nephrolithiasis 06/28/2023   Hydronephrosis 06/28/2023   HLD (hyperlipidemia) 06/28/2023   T2DM (type 2 diabetes mellitus) (HCC) 06/28/2023   Delayed sleep phase syndrome 03/26/2020   Encounter for long-term (current) use of insulin  (HCC) 08/07/2016   OSA (obstructive sleep apnea) 06/04/2015   Neuropathy, diabetic (HCC) 04/16/2015   Medicare annual wellness visit, initial 01/11/2015   Insulin -requiring or dependent type II diabetes mellitus (HCC) 08/10/2013   Vitamin D  deficiency 04/03/2013   Medication management 04/03/2013   Hyperlipidemia associated with type 2 diabetes mellitus (HCC) 04/03/2013   Hypertension 05/09/2011   Depression, major, in remission (HCC) 05/09/2011   GERD 05/09/2011    PCP: Augustus Blood, DO  REFERRING PROVIDER:  Bureau, NP   REFERRING DIAG: R42 (ICD-10-CM) - Dizziness and giddiness  THERAPY DIAG:  No diagnosis found.  ONSET DATE: ***  Rationale for Evaluation and Treatment: Rehabilitation  SUBJECTIVE:   SUBJECTIVE STATEMENT: *** Pt accompanied by: {accompnied:27141}  PERTINENT HISTORY: Anxiety, depression, hiatal hernia, HLD, HTN, DMII, L shoulder scope  PAIN:  Are you having pain? {OPRCPAIN:27236}  PRECAUTIONS: {Therapy precautions:24002}  RED FLAGS: {PT Red Flags:29287}   WEIGHT BEARING RESTRICTIONS: {Yes ***/No:24003}  FALLS: Has patient fallen in last 6 months? {fallsyesno:27318}  LIVING ENVIRONMENT: Lives with: {OPRC lives with:25569::"lives with their family"} Lives in: {Lives in:25570} Stairs: {opstairs:27293} Has following equipment at home: {Assistive devices:23999}  PLOF: {PLOF:24004}  PATIENT GOALS: ***  OBJECTIVE:  Note: Objective measures were completed at Evaluation unless otherwise noted.  DIAGNOSTIC FINDINGS: none recent  COGNITION: Overall  cognitive status: {cognition:24006}   SENSATION: {sensation:27233}  POSTURE:  {posture:25561}  GAIT: Gait pattern: {gait characteristics:25376} Distance walked: *** Assistive device utilized: {Assistive devices:23999} Level of assistance: {Levels of assistance:24026} Comments: ***  FUNCTIONAL TESTS:  {Functional tests:24029}  PATIENT SURVEYS:  DHI ***  VESTIBULAR ASSESSMENT:   GENERAL OBSERVATION: ***   OCULOMOTOR EXAM: {Ocular Alignment:32714:s}   Ocular ROM: {RANGE OF MOTION:21649}   Spontaneous Nystagmus: {Spontaneous nystagmus:25263}   Gaze-Induced Nystagmus: {gaze-induced nystagmus:25264}   Smooth Pursuits: {smooth pursuit:25265}   Saccades: {saccades:25266}   Convergence/Divergence: *** cm    VESTIBULAR - OCULAR REFLEX:    Slow VOR: {slow VOR:25290}   VOR Cancellation: {vor cancellation:25291}   Head-Impulse Test: {head impulse test:25272}   Dynamic Visual Acuity: {dynamic visual acuity:25273}  AUDITORY SCREEN:  Rinne Test {DESC;Negative/Positive:[id number]}    POSITIONAL TESTING:  Right Roll Test: *** Left Roll Test: ***  Right Loaded Dix-Hallpike: *** Left Loaded Dix-Hallpike: ***  Right Sidelying: *** Left Sidelying: ***     MOTION SENSITIVITY:    Motion Sensitivity Quotient  Intensity: 0 = none, 1 = Lightheaded, 2 = Mild, 3 = Moderate, 4 = Severe, 5 = Vomiting  Intensity  1. Sitting to supine   2. Supine to L side   3. Supine to R side   4. Supine to sitting   5. L Hallpike-Dix   6. Up from L    7. R Hallpike-Dix   8. Up from R    9. Sitting, head  tipped to L knee   10. Head up from L  knee   11. Sitting, head  tipped to R knee   12. Head up from R  knee   13. Sitting head turns x5   14.Sitting head nods x5   15. In stance, 180  turn to L    16. In stance, 180  turn to R                                                                                                                                TREATMENT DATE:  ***   Canalith Repositioning:  {Canalith Repositioning:25283} Gaze Adaptation:  {gaze adaptation:25286} Habituation:  {habituation:25288} Other: ***  PATIENT EDUCATION: Education details: *** Person educated: {Person educated:25204} Education method: {Education Method:25205} Education comprehension: {Education Comprehension:25206}  HOME EXERCISE PROGRAM:   GOALS: Goals reviewed with patient? Yes  SHORT TERM GOALS: Target date: {follow up:25551}  Patient to be independent with initial HEP. Baseline: HEP initiated Goal status: {GOALSTATUS:25110}    LONG TERM GOALS: Target date: {follow up:25551}  Patient to be independent with advanced HEP. Baseline: Not yet initiated  Goal status: {GOALSTATUS:25110}  Patient to report 0/10 dizziness with standing vertical and horizontal VOR for 30 seconds. Baseline: Unable Goal status: {GOALSTATUS:25110}  Patient will report 0/10 dizziness with bed mobility.  Baseline: Symptomatic  Goal status: {  GOALSTATUS:25110}  Patient to demonstrate *** sway with M-CTSIB condition with eyes closed/foam surface in order to improve safety in environments with uneven surfaces and dim lighting. Baseline: *** Goal status: {GOALSTATUS:25110}  Patient to score at least 20/24 on DGI in order to decrease risk of falls. Baseline: *** Goal status: {GOALSTATUS:25110}  Patient will ambulate over outdoor surfaces with LRAD while performing head turns to scan environment with good stability in order to indicate safe community mobility. Baseline: Unable Goal status: {GOALSTATUS:25110}  Patient to score at least 18 points less on DHI in order to meet MCID and improve functional outcomes.  Baseline: *** Goal status: {GOALSTATUS:25110}    ASSESSMENT:  CLINICAL IMPRESSION:   Patient is a 68 y/o F presenting to [facility] with c/o dizziness for the past ***.   Denies head trauma, infection/illness, vision changes/double vision, hearing loss, tinnitus,  otalgia, photo/phonophobia.  Oculomotor exam revealed ***.  Positional testing was ***.   Patient was educated on gentle *** HEP and reported understanding. Would benefit from skilled PT services ***x/week for *** weeks to address aforementioned impairments in order to optimize level of function.    OBJECTIVE IMPAIRMENTS: {opptimpairments:25111}.   ACTIVITY LIMITATIONS: {activitylimitations:27494}  PARTICIPATION LIMITATIONS: {participationrestrictions:25113}  PERSONAL FACTORS: {Personal factors:25162} are also affecting patient's functional outcome.   REHAB POTENTIAL: {rehabpotential:25112}  CLINICAL DECISION MAKING: {clinical decision making:25114}  EVALUATION COMPLEXITY: {Evaluation complexity:25115}   PLAN:  PT FREQUENCY: {rehab frequency:25116}  PT DURATION: {rehab duration:25117}  PLANNED INTERVENTIONS: {rehab planned interventions:25118::"97110-Therapeutic exercises","97530- Therapeutic [phone]- Neuromuscular re-education","97535- Self JXBJ","47829- Manual therapy"}  PLAN FOR NEXT SESSION: Arlene Lacy, PT 10/20/2023, 7:49 AM

## 2023-10-21 ENCOUNTER — Ambulatory Visit: Admitting: Physical Therapy

## 2023-10-30 DIAGNOSIS — J069 Acute upper respiratory infection, unspecified: Secondary | ICD-10-CM | POA: Diagnosis not present

## 2023-10-30 DIAGNOSIS — Z1231 Encounter for screening mammogram for malignant neoplasm of breast: Secondary | ICD-10-CM | POA: Diagnosis not present

## 2023-11-17 DIAGNOSIS — E119 Type 2 diabetes mellitus without complications: Secondary | ICD-10-CM | POA: Diagnosis not present

## 2023-11-18 DIAGNOSIS — E1159 Type 2 diabetes mellitus with other circulatory complications: Secondary | ICD-10-CM | POA: Diagnosis not present

## 2023-11-18 DIAGNOSIS — E559 Vitamin D deficiency, unspecified: Secondary | ICD-10-CM | POA: Diagnosis not present

## 2023-11-18 DIAGNOSIS — I10 Essential (primary) hypertension: Secondary | ICD-10-CM | POA: Diagnosis not present

## 2023-11-18 DIAGNOSIS — D509 Iron deficiency anemia, unspecified: Secondary | ICD-10-CM | POA: Diagnosis not present

## 2023-11-18 DIAGNOSIS — E782 Mixed hyperlipidemia: Secondary | ICD-10-CM | POA: Diagnosis not present

## 2023-11-18 DIAGNOSIS — E538 Deficiency of other specified B group vitamins: Secondary | ICD-10-CM | POA: Diagnosis not present

## 2023-11-24 DIAGNOSIS — I10 Essential (primary) hypertension: Secondary | ICD-10-CM | POA: Diagnosis not present

## 2023-11-24 DIAGNOSIS — E1159 Type 2 diabetes mellitus with other circulatory complications: Secondary | ICD-10-CM | POA: Diagnosis not present

## 2023-11-24 DIAGNOSIS — K219 Gastro-esophageal reflux disease without esophagitis: Secondary | ICD-10-CM | POA: Diagnosis not present

## 2023-11-24 DIAGNOSIS — E782 Mixed hyperlipidemia: Secondary | ICD-10-CM | POA: Diagnosis not present

## 2023-11-24 DIAGNOSIS — Z0000 Encounter for general adult medical examination without abnormal findings: Secondary | ICD-10-CM | POA: Diagnosis not present

## 2023-12-24 DIAGNOSIS — K080 Exfoliation of teeth due to systemic causes: Secondary | ICD-10-CM | POA: Diagnosis not present

## 2024-01-25 DIAGNOSIS — R195 Other fecal abnormalities: Secondary | ICD-10-CM | POA: Diagnosis not present

## 2024-01-28 DIAGNOSIS — R195 Other fecal abnormalities: Secondary | ICD-10-CM | POA: Diagnosis not present

## 2024-02-11 DIAGNOSIS — K080 Exfoliation of teeth due to systemic causes: Secondary | ICD-10-CM | POA: Diagnosis not present

## 2024-02-17 DIAGNOSIS — R399 Unspecified symptoms and signs involving the genitourinary system: Secondary | ICD-10-CM | POA: Diagnosis not present

## 2024-02-17 DIAGNOSIS — N200 Calculus of kidney: Secondary | ICD-10-CM | POA: Diagnosis not present

## 2024-04-28 DIAGNOSIS — G8929 Other chronic pain: Secondary | ICD-10-CM | POA: Diagnosis not present

## 2024-04-28 DIAGNOSIS — M25572 Pain in left ankle and joints of left foot: Secondary | ICD-10-CM | POA: Diagnosis not present
